# Patient Record
Sex: Female | Born: 1965
Health system: Southern US, Community
[De-identification: ages and names within clinical notes are randomized; demographics above are authoritative.]

## PROBLEM LIST (undated history)

## (undated) DIAGNOSIS — I4892 Unspecified atrial flutter: Secondary | ICD-10-CM

## (undated) DIAGNOSIS — I509 Heart failure, unspecified: Secondary | ICD-10-CM

## (undated) DIAGNOSIS — I495 Sick sinus syndrome: Secondary | ICD-10-CM

## (undated) DIAGNOSIS — E039 Hypothyroidism, unspecified: Secondary | ICD-10-CM

## (undated) DIAGNOSIS — E669 Obesity, unspecified: Secondary | ICD-10-CM

## (undated) DIAGNOSIS — Z9889 Other specified postprocedural states: Secondary | ICD-10-CM

## (undated) DIAGNOSIS — R011 Cardiac murmur, unspecified: Secondary | ICD-10-CM

## (undated) DIAGNOSIS — Z9289 Personal history of other medical treatment: Secondary | ICD-10-CM

## (undated) DIAGNOSIS — I4891 Unspecified atrial fibrillation: Secondary | ICD-10-CM

## (undated) DIAGNOSIS — E119 Type 2 diabetes mellitus without complications: Secondary | ICD-10-CM

## (undated) DIAGNOSIS — I499 Cardiac arrhythmia, unspecified: Secondary | ICD-10-CM

## (undated) DIAGNOSIS — Z950 Presence of cardiac pacemaker: Secondary | ICD-10-CM

## (undated) DIAGNOSIS — I10 Essential (primary) hypertension: Secondary | ICD-10-CM

## (undated) HISTORY — DX: Hypothyroidism, unspecified: E03.9

## (undated) HISTORY — DX: Type 2 diabetes mellitus without complications: E11.9

## (undated) HISTORY — DX: Obesity, unspecified: E66.9

## (undated) HISTORY — DX: Unspecified atrial flutter: I48.92

## (undated) HISTORY — DX: Unspecified atrial fibrillation: I48.91

## (undated) HISTORY — DX: Sick sinus syndrome: I49.5

## (undated) HISTORY — DX: Other specified postprocedural states: Z98.890

## (undated) HISTORY — DX: Personal history of other medical treatment: Z92.89

## (undated) HISTORY — DX: Essential (primary) hypertension: I10

---

## 2013-02-03 DIAGNOSIS — I4892 Unspecified atrial flutter: Secondary | ICD-10-CM

## 2013-02-03 DIAGNOSIS — I4891 Unspecified atrial fibrillation: Secondary | ICD-10-CM

## 2013-02-03 HISTORY — DX: Unspecified atrial fibrillation: I48.91

## 2013-02-03 HISTORY — DX: Unspecified atrial flutter: I48.92

## 2013-02-03 HISTORY — PX: ATRIAL FLUTTER ABLATION: SHX5733

## 2013-03-06 HISTORY — PX: CARDIAC CATHETERIZATION: SHX172

## 2013-03-06 HISTORY — PX: PACEMAKER INSERTION: SHX728

## 2014-06-29 ENCOUNTER — Ambulatory Visit (INDEPENDENT_AMBULATORY_CARE_PROVIDER_SITE_OTHER): Payer: Managed Care, Other (non HMO) | Admitting: Cardiovascular Disease

## 2014-06-29 ENCOUNTER — Encounter: Payer: Self-pay | Admitting: Cardiovascular Disease

## 2014-06-29 VITALS — BP 146/82 | HR 101 | Ht 62.0 in | Wt 256.0 lb

## 2014-06-29 DIAGNOSIS — Z8679 Personal history of other diseases of the circulatory system: Secondary | ICD-10-CM | POA: Diagnosis not present

## 2014-06-29 DIAGNOSIS — I10 Essential (primary) hypertension: Secondary | ICD-10-CM | POA: Insufficient documentation

## 2014-06-29 NOTE — Progress Notes (Signed)
06/29/2014 Priscilla [name]   16-May-1965  [id number]  Primary Physician Priscilla Alken, MD Primary Cardiologist: Priscilla Gess MD Priscilla [name]   HPI:  [name] is a very pleasant 50 year old severely overweight married Caucasian female mother of 3 children who relocated from [place] to the [place] area in December because of her husband's job with [employer]. Her primary care physician is Dr. Juluis [name]. She is referred here to be established because of prior history of atrial flutter, atrial flutter ablation, stent implantation. She has basically no chronic risk factors other than hypertension and family history with father who had a stent in his 58s and died of a myocardial infarction at age 33. She has never had a heart attack or stroke. She did have atrial flutter in February of last year patient underwent cardiac catheterization and was told she had "clean plumbing". She had atrial flutter ablation and subsequent required a Medtronic pacemaker which was followed in [place]. 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 She does complain of an occasional dry hacking cough and some chest heaviness however with recent cath that was clean I suspect this is unrelated to cardiac etiology.   Current Outpatient Prescriptions  Medication Sig Dispense Refill  . levothyroxine (SYNTHROID, LEVOTHROID) 25 MCG tablet Take 25 mcg by mouth daily. Take 1 tab daily  0  . losartan (COZAAR) 25 MG tablet Take 25 mg by mouth daily.    . metoprolol (LOPRESSOR) 50 MG tablet Take 50 mg by mouth 2 (two) times daily.     No current facility-administered medications for this visit.    No Known Allergies  History   Social History  . Marital Status: Married    Spouse Name: N/A  . Number of Children: N/A  . Years of Education: N/A   Occupational History  . Not on file.   Social History Main Topics  . Smoking status: Never Smoker   . Smokeless tobacco: Never Used  . Alcohol Use:  1.2 - 2.4 oz/week    0 Standard drinks or equivalent, 2-4 Glasses of wine per week  . Drug Use: No  . Sexual Activity: Not on file   Other Topics Concern  . Not on file   Social History Narrative  . No narrative on file     Review of Systems: General: negative for chills, fever, night sweats or weight changes.  Cardiovascular: negative for chest pain, dyspnea on exertion, edema, orthopnea, palpitations, paroxysmal nocturnal dyspnea or shortness of breath Dermatological: negative for rash Respiratory: negative for cough or wheezing Urologic: negative for hematuria Abdominal: negative for nausea, vomiting, diarrhea, bright red blood per rectum, melena, or hematemesis Neurologic: negative for visual changes, syncope, or dizziness All other systems reviewed and are otherwise negative except as noted above.    Blood pressure 146/82, pulse 101, height  (1.575 m), weight 256 lb (116.121 kg), last menstrual period 06/22/2014.  General appearance: alert and no distress Neck: no adenopathy, no carotid bruit, no JVD, supple, symmetrical, trachea midline and thyroid not enlarged, symmetric, no tenderness/mass/nodules Lungs: clear to auscultation bilaterally Heart: regular rate and rhythm, S1, S2 normal, no murmur, click, rub or gallop Extremities: extremities normal, atraumatic, no cyanosis or edema  EKG atrially paced rhythm at 101. I personally reviewed this EKG.  ASSESSMENT AND PLAN:   History of atrial flutter Patient had atrial flutter status post ablation and subsequent Medtronic pacemaker implantation February 2015 in [place]. She is not on an oral quadrant. I'm going  to refer her to Priscilla [name] for pacer check and evaluation.   Essential hypertension History of hypertension blood pressure measured today at 146/92. She is on losartan and metoprolol. Continue current meds at current dosing       Priscilla GessJonathan J. Gwen Edler MD [facility] HospitalFACP,FACC,FAHA, [facility] Hospital[facility]SCAI 06/29/2014 3:38 PM

## 2014-06-29 NOTE — Patient Instructions (Signed)
Follow up with Dr Royann Shiversroitoru in 1 month.

## 2014-06-29 NOTE — Assessment & Plan Note (Signed)
Patient had atrial flutter status post ablation and subsequent Medtronic pacemaker [place]. She is not on an oral quadrant. I'm going to refer her to Dr. Royann Shiversroitoru for pacer check and evaluation.

## 2014-06-29 NOTE — Assessment & Plan Note (Signed)
History of hypertension blood pressure measured today at 146/92. She is on losartan and metoprolol. Continue current meds at current dosing

## 2014-07-12 ENCOUNTER — Telehealth: Payer: Self-pay | Admitting: Cardiovascular Disease

## 2014-07-12 NOTE — Telephone Encounter (Signed)
Doctor is calling to get approval on a giving the pt anestheia for a procedure today

## 2014-07-12 NOTE — Telephone Encounter (Signed)
Dr. Colon BranchLeedee (sp?) dentist is calling to get OK to use lido w/epi for local anesthesia for dental cleaning and fillings.   Spoke with DOD, Dr. Allyson SabalBerry - who saw patient 5/26 - and he OK'ed this procedure

## 2014-07-19 ENCOUNTER — Telehealth: Payer: Self-pay | Admitting: Cardiovascular Disease

## 2014-07-19 NOTE — Telephone Encounter (Signed)
Received records from request made to [facility] for records by Dr Allyson Sabal on 06/29/14. Patient has appointment with Dr Royann Shivers on 08/01/14.  Records given to [facility] (medical records) for Dr Croitoru's schedule on 08/01/14. lp

## 2014-08-01 ENCOUNTER — Encounter: Payer: Self-pay | Admitting: Cardiovascular Disease

## 2014-08-01 ENCOUNTER — Ambulatory Visit (INDEPENDENT_AMBULATORY_CARE_PROVIDER_SITE_OTHER): Payer: Managed Care, Other (non HMO) | Admitting: Cardiovascular Disease

## 2014-08-01 VITALS — BP 124/72 | HR 86 | Ht 62.0 in | Wt 256.0 lb

## 2014-08-01 DIAGNOSIS — Z8679 Personal history of other diseases of the circulatory system: Secondary | ICD-10-CM

## 2014-08-01 DIAGNOSIS — I495 Sick sinus syndrome: Secondary | ICD-10-CM

## 2014-08-01 DIAGNOSIS — Z950 Presence of cardiac pacemaker: Secondary | ICD-10-CM

## 2014-08-01 DIAGNOSIS — R0600 Dyspnea, unspecified: Secondary | ICD-10-CM | POA: Diagnosis not present

## 2014-08-01 HISTORY — DX: Sick sinus syndrome: I49.5

## 2014-08-01 LAB — CUP PACEART INCLINIC DEVICE CHECK
Battery Remaining Longevity: 78 mo
Battery Voltage: 3 V
Brady Statistic AP VP Percent: 91.96 %
Brady Statistic AP VS Percent: 7.97 %
Brady Statistic AS VP Percent: 0 %
Brady Statistic AS VS Percent: 0.07 %
Brady Statistic RA Percent Paced: 99.93 %
Brady Statistic RV Percent Paced: 91.96 %
Date Time Interrogation Session: 20160628094228
Lead Channel Impedance Value: 361 Ohm
Lead Channel Impedance Value: 361 Ohm
Lead Channel Impedance Value: 456 Ohm
Lead Channel Impedance Value: 513 Ohm
Lead Channel Pacing Threshold Amplitude: 1 V
Lead Channel Pacing Threshold Amplitude: 1.25 V
Lead Channel Pacing Threshold Pulse Width: 0.6 ms
Lead Channel Pacing Threshold Pulse Width: 0.6 ms
Lead Channel Sensing Intrinsic Amplitude: 10.25 mV
Lead Channel Sensing Intrinsic Amplitude: 11.375 mV
Lead Channel Sensing Intrinsic Amplitude: 4.75 mV
Lead Channel Sensing Intrinsic Amplitude: 4.75 mV
Lead Channel Setting Pacing Amplitude: 2 V
Lead Channel Setting Pacing Amplitude: 2.5 V
Lead Channel Setting Pacing Pulse Width: 0.6 ms
Lead Channel Setting Sensing Sensitivity: 0.9 mV
Zone Setting Detection Interval: 350 ms
Zone Setting Detection Interval: 400 ms

## 2014-08-01 NOTE — Progress Notes (Signed)
Patient ID: Priscilla [name], female   DOB: 12-09-1965, 49 y.o.   MRN: [id number]     Cardiology Office Note   Date:  08/01/2014   ID:  Priscilla [name], DOB 12-09-1965, MRN [id number]  PCP:  Gaye AlkenBARNES,ELIZABETH STEWART, MD  Cardiologist:   Thurmon FairROITORU,Bard Haupert, MD   Chief Complaint  Patient presents with  . Follow-up    pacer check, chest discomfort for the last 3 month like when recoverin from pace maker and coughing with deep breath      History of Present Illness: Priscilla [name] is a 49 y.o. female who presents for establishment of new pacemaker follow-up. [facility] [place] [facility] [place] [facility] [place] and will be seeing Dr. Nanetta BattyJonathan Berry for general cardiology care. Last year she had problems with atrial flutter and underwent radiofrequency ablation, apparently requiring a pacemaker after that procedure. She recalls having chest discomfort with pleuritic qualities following those events. Over the last roughly 3 months she has noticed similar discomfort in her chest and the need to cough if she tries to take a deep breath.  Limited records are available for review. The discharge summary from 03/09/2013 listed diagnosis of "new onset atrial fibrillation with rapid ventricular rate". It also describes left ventricular ejection fraction of 25-30 percent area of the records describe atrial flutter with rapid ventricular response. Actual tracings of the rhythm are not available for review. A cardiac catheterization with coronary angiography was performed and showed normal coronary arteries. Left ventricular opacification on that angiogram was poor. There are no records available from the actual ablation procedure pacemaker implantation, performed at a different Medical Center.  Interrogation of her pacemaker shows normal device function. Estimated generator longevity is about 6 years. Lead parameters are favorable on both the atrial and ventricular lead. Activity level is roughly 2 hours a day and constant. There is 92%  ventricular pacing and 100% atrial pacing. I'm not sure why, but her lower rate was set at 85 bpm. Today when we extended the paced AV delay and sensed AV delay to 300 ms there was 1-1 atrioventricular conduction. Even when the atrial rate is taken down however there continues to be virtually 100% atrial pacing with infrequent sensed P waves. There have been no episodes of atrial tachycardia or atrial fibrillation.    Past Medical History  Diagnosis Date  . History of atrial flutter     status post a flutter ablation and subsequent Medtronic pacemaker, battery 2015  . Hypertension   . Hypothyroidism   . Pacemaker 08/01/2014    Dual chamber Medtronic advisa MRI conditional implanted February 2015    Past Surgical History  Procedure Laterality Date  . Cesarean section      x3  . Atrial ablation surgery      for a flutter, required pace maker post procedure  . Pacemaker insertion  03/2013  . Cardiac catheterization  03/2013     Current Outpatient Prescriptions  Medication Sig Dispense Refill  . levothyroxine (SYNTHROID, LEVOTHROID) 25 MCG tablet Take 25 mcg by mouth daily. Take 1 tab daily  0  . losartan (COZAAR) 25 MG tablet Take 25 mg by mouth daily.    . Melatonin 5 MG TABS Take 1 tablet by mouth as needed.    . metoprolol (LOPRESSOR) 50 MG tablet Take 50 mg by mouth 2 (two) times daily.     No current facility-administered medications for this visit.    Allergies:   Review of patient's allergies indicates no known allergies.    Social History:  The patient  reports that she has never smoked. She has never used smokeless tobacco. She reports that she drinks about 1.2 - 2.4 oz of alcohol per week. She reports that she does not use illicit drugs.   Family History:  The patient's family history includes Cancer in her maternal grandmother; Heart attack (age of onset: 82) in her paternal grandfather; Heart attack (age of onset: 66) in her father; Rheumatic fever in her mother; Stroke  in her mother.    ROS:  Please see the history of present illness.    Otherwise, review of systems positive for difficulty sleeping on her back, prefers to sleep on her side..   All other systems are reviewed and negative.    PHYSICAL EXAM: VS:  BP 124/72 mmHg  Pulse 86 , BMI Body mass index is 46.81 kg/(m^2). height 5 foot 2 inches, weight 256 pounds  General: Alert, oriented x3, no distress Head: no evidence of trauma, PERRL, EOMI, no exophtalmos or lid lag, no myxedema, no xanthelasma; normal ears, nose and oropharynx Neck: normal jugular venous pulsations and no hepatojugular reflux; brisk carotid pulses without delay and no carotid bruits Chest: clear to auscultation, no signs of consolidation by percussion or palpation, normal fremitus, symmetrical and full respiratory excursions Cardiovascular: normal position and quality of the apical impulse, regular rhythm, normal first and second heart sounds, no murmurs, rubs or gallops Abdomen: no tenderness or distention, no masses by palpation, no abnormal pulsatility or arterial bruits, normal bowel sounds, no hepatosplenomegaly Extremities: no clubbing, cyanosis or edema; 2+ radial, ulnar and brachial pulses bilaterally; 2+ right femoral, posterior tibial and dorsalis pedis pulses; 2+ left femoral, posterior tibial and dorsalis pedis pulses; no subclavian or femoral bruits Neurological: grossly nonfocal Psych: euthymic mood, full affect   EKG:  EKG is not ordered today. The ekg ordered May 27 demonstrates atrial paced at 100 bpm, ventricular sensed with broad QRS (nonspecific IVCD with left axis deviation, most closely resembling left anterior fascicular block and incomplete right bundle branch block)   Wt Readings from Last 3 Encounters:  06/29/14 256 lb (116.121 kg)      ASSESSMENT AND PLAN:  1. Dual-chamber permanent pacemaker with normal function but with unusual settings. I'm not sure why she would require a lower rate limit of  85 bpm. I also think we should try to avoid ventricular pacing, especially with a history of depressed left ventricular systolic function. Device reprogrammed MVP-R the lower rate limit of 60 bpm. With the settings there was 100% atrial paced ventricular sensed rhythm while in the office. Reducing the dose of beta blocker may lead to less atrial pacing as well, but will leave this decision for a future date after discussing her care with Dr. Nanetta Batty.  2. Reported history of atrial flutter radiofrequency ablation complicated by need for pacemaker. This is also an unusual occurrence with atrial flutter cavotricuspid isthmus ablation and one wonders whether a different ablation procedure might have been performed. Would recommend retrieving the records from the Center with this procedure was performed [facility]  3. Pleuritic chest discomfort. This suggests that she may have had post ablation or post pacemaker acute pericarditis. Why she would have recurrent symptoms of this at this time is uncertain. Recommend repeat echocardiography.  4. Consider obstructive sleep apnea or other cause for cor pulmonale. She is morbidly obese and therefore at risk for developing pulmonary hypertension with secondary right heart overload and right atrial arrhythmia.  Will check an echocardiogram. If there  is clear evidence of right heart enlargement, with pursue evaluation for pulmonary hypertension and/or obstructive sleep apnea. Echo will also help clarify what her current left ventricular systolic function is (per report from [facility] EF 25-30 percent before the ablation, question tachycardia-induced cardiomyopathy).  From a strictly pacemaker point of view we'll plan remote downloads every 3 months and yearly office visits. She does not appear to have either complete heart block or pacemaker dependency.   Current medicines are reviewed at length with the patient today.  The patient does not  have concerns regarding medicines.  The following changes have been made:  no change  Labs/ tests ordered today include:   Orders Placed This Encounter  Procedures  . Implantable device check  . ECHOCARDIOGRAM COMPLETE   Patient Instructions  Your physician has requested that you have an echocardiogram. Echocardiography is a painless test that uses sound waves to create images of your heart. It provides your doctor with information about the size and shape of your heart and how well your heart's chambers and valves are working. This procedure takes approximately one hour. There are no restrictions for this procedure.    Remote monitoring is used to monitor your pacemaker from home. This monitoring reduces the number of office visits required to check your device to one time per year. It allows [place] to keep an eye on the functioning of your device to ensure it is working properly. You are scheduled for a device check from home on 10/31/2014. You may send your transmission at any time that day. If you have a wireless device, the transmission will be sent automatically. After your physician reviews your transmission, you will receive a postcard with your next transmission date.  Your physician recommends that you schedule a follow-up appointment in: 12 months with Dr.Hrishikesh Hoeg      Joie Bimler, MD  08/01/2014 4:46 PM    Thurmon Fair, MD, [facility] [phone] office [phone] pager

## 2014-08-01 NOTE — Patient Instructions (Addendum)
Your physician has requested that you have an echocardiogram. Echocardiography is a painless test that uses sound waves to create images of your heart. It provides your doctor with information about the size and shape of your heart and how well your heart's chambers and valves are working. This procedure takes approximately one hour. There are no restrictions for this procedure.    Remote monitoring is used to monitor your pacemaker from home. This monitoring reduces the number of office visits required to check your device to one time per year. It allows us to keep an eye on the functioning of your device to ensure it is working properly. You are scheduled for a device check from home on 10/31/2014. You may send your transmission at any time that day. If you have a wireless device, the transmission will be sent automatically. After your physician reviews your transmission, you will receive a postcard with your next transmission date.  Your physician recommends that you schedule a follow-up appointment in: 12 months with Dr.Croitoru

## 2014-08-02 ENCOUNTER — Ambulatory Visit (HOSPITAL_COMMUNITY)
Admission: RE | Admit: 2014-08-02 | Discharge: 2014-08-02 | Disposition: A | Discharge status: Home / Self Care | Payer: Managed Care, Other (non HMO) | Source: Ambulatory Visit | Attending: Cardiovascular Disease | Admitting: Cardiovascular Disease

## 2014-08-02 DIAGNOSIS — Z8249 Family history of ischemic heart disease and other diseases of the circulatory system: Secondary | ICD-10-CM | POA: Insufficient documentation

## 2014-08-02 DIAGNOSIS — I10 Essential (primary) hypertension: Secondary | ICD-10-CM | POA: Insufficient documentation

## 2014-08-02 DIAGNOSIS — R0600 Dyspnea, unspecified: Secondary | ICD-10-CM | POA: Diagnosis not present

## 2014-08-04 ENCOUNTER — Telehealth: Payer: Self-pay | Admitting: *Deleted

## 2014-08-04 NOTE — Telephone Encounter (Signed)
LMTCB//sss 

## 2014-08-10 ENCOUNTER — Telehealth: Payer: Self-pay | Admitting: Cardiovascular Disease

## 2014-08-10 NOTE — Telephone Encounter (Signed)
Patient stated that the initial day after the ppm changes patient states that she didn't feel well, but after that day she felt fine. Patient states that she felt some CP and LE edema after drinking 2 large beers while on vacation (7/3)--CP resolved by next day (7/4), edema resolved (7/5), pt denied unusual ShOB -chronic DOE. Patient states that overall she feels better than she has in a while. [facility].

## 2014-08-10 NOTE — Telephone Encounter (Signed)
Follow Up ° °Pt returned call//  °

## 2014-08-10 NOTE — Telephone Encounter (Signed)
Thanks

## 2014-08-10 NOTE — Telephone Encounter (Signed)
Returning your call,from last week.

## 2014-08-11 NOTE — Telephone Encounter (Signed)
Returned patient's call. See Echocardiogram results - Dr Royann Shiversroitoru wanted to know how she felt after changes in pacemaker settings.  Patient was on vacation last week. She states she hasn't really felt any different since adjusting resting heart rate/pacemaker settings. Still reports some chest discomfort. Patient stated she'd give the changes a couple more weeks now that she is back home from vacation and back to her normal routine.

## 2014-08-14 ENCOUNTER — Encounter: Payer: Self-pay | Admitting: Cardiovascular Disease

## 2014-08-17 ENCOUNTER — Telehealth: Payer: Self-pay | Admitting: Cardiovascular Disease

## 2014-08-17 NOTE — Telephone Encounter (Signed)
New Message      Pt calling stating that she received her Medtronic CRT device, downloaded the app and uploaded the reading. Please call back and advise.

## 2014-08-18 NOTE — Telephone Encounter (Signed)
Informed patient that "test" transmission was received. Patient voiced understanding.

## 2014-10-31 ENCOUNTER — Telehealth: Payer: Self-pay | Admitting: Cardiology

## 2014-10-31 ENCOUNTER — Ambulatory Visit (INDEPENDENT_AMBULATORY_CARE_PROVIDER_SITE_OTHER): Payer: Managed Care, Other (non HMO) | Admitting: *Deleted

## 2014-10-31 DIAGNOSIS — Z950 Presence of cardiac pacemaker: Secondary | ICD-10-CM | POA: Diagnosis not present

## 2014-10-31 DIAGNOSIS — Z8679 Personal history of other diseases of the circulatory system: Secondary | ICD-10-CM

## 2014-10-31 NOTE — Progress Notes (Signed)
Remote pacemaker transmission.   

## 2014-10-31 NOTE — Telephone Encounter (Signed)
Spoke with pt and reminded pt of remote transmission that is due today. Pt verbalized understanding.   

## 2014-11-06 LAB — CUP PACEART REMOTE DEVICE CHECK
Battery Remaining Longevity: 95 mo
Battery Voltage: 3.02 V
Brady Statistic AP VP Percent: 0.62 %
Brady Statistic AP VS Percent: 98.93 %
Brady Statistic AS VP Percent: 0 %
Brady Statistic AS VS Percent: 0.45 %
Brady Statistic RA Percent Paced: 99.55 %
Brady Statistic RV Percent Paced: 0.62 %
Date Time Interrogation Session: 20160927163056
Lead Channel Impedance Value: 361 Ohm
Lead Channel Impedance Value: 361 Ohm
Lead Channel Impedance Value: 399 Ohm
Lead Channel Impedance Value: 456 Ohm
Lead Channel Pacing Threshold Amplitude: 1 V
Lead Channel Pacing Threshold Amplitude: 2 V
Lead Channel Pacing Threshold Pulse Width: 0.4 ms
Lead Channel Pacing Threshold Pulse Width: 0.4 ms
Lead Channel Sensing Intrinsic Amplitude: 1.125 mV
Lead Channel Sensing Intrinsic Amplitude: 1.125 mV
Lead Channel Sensing Intrinsic Amplitude: 9.5 mV
Lead Channel Sensing Intrinsic Amplitude: 9.5 mV
Lead Channel Setting Pacing Amplitude: 2 V
Lead Channel Setting Pacing Amplitude: 4 V
Lead Channel Setting Pacing Pulse Width: 0.4 ms
Lead Channel Setting Sensing Sensitivity: 0.9 mV
Zone Setting Detection Interval: 350 ms
Zone Setting Detection Interval: 400 ms

## 2014-11-09 ENCOUNTER — Encounter: Payer: Self-pay | Admitting: Cardiology

## 2014-11-13 DIAGNOSIS — Z9289 Personal history of other medical treatment: Secondary | ICD-10-CM

## 2014-11-13 HISTORY — DX: Personal history of other medical treatment: Z92.89

## 2014-11-16 ENCOUNTER — Encounter: Payer: Self-pay | Admitting: Cardiology

## 2014-11-23 ENCOUNTER — Encounter: Payer: Self-pay | Admitting: Cardiovascular Disease

## 2014-12-13 ENCOUNTER — Other Ambulatory Visit: Payer: Self-pay | Admitting: Obstetrics and Gynecology

## 2014-12-25 ENCOUNTER — Encounter: Payer: Self-pay | Admitting: Cardiovascular Disease

## 2014-12-25 ENCOUNTER — Encounter: Payer: Self-pay | Admitting: *Deleted

## 2014-12-26 ENCOUNTER — Encounter: Payer: Self-pay | Admitting: Cardiovascular Disease

## 2014-12-26 ENCOUNTER — Encounter (HOSPITAL_COMMUNITY): Payer: Self-pay

## 2014-12-26 ENCOUNTER — Encounter (HOSPITAL_COMMUNITY)
Admission: RE | Admit: 2014-12-26 | Discharge: 2014-12-26 | Disposition: A | Discharge status: Home / Self Care | Payer: Managed Care, Other (non HMO) | Source: Ambulatory Visit | Attending: Obstetrics and Gynecology | Admitting: Obstetrics and Gynecology

## 2014-12-26 DIAGNOSIS — R32 Unspecified urinary incontinence: Secondary | ICD-10-CM | POA: Diagnosis not present

## 2014-12-26 DIAGNOSIS — Z01818 Encounter for other preprocedural examination: Secondary | ICD-10-CM | POA: Insufficient documentation

## 2014-12-26 LAB — CBC
HCT: 37.9 % (ref 36.0–46.0)
Hemoglobin: 12.5 g/dL (ref 12.0–15.0)
MCH: 29.3 pg (ref 26.0–34.0)
MCHC: 33 g/dL (ref 30.0–36.0)
MCV: 88.8 fL (ref 78.0–100.0)
Platelets: 280 10*3/uL (ref 150–400)
RBC: 4.27 MIL/uL (ref 3.87–5.11)
RDW: 13.6 % (ref 11.5–15.5)
WBC: 9.8 10*3/uL (ref 4.0–10.5)

## 2014-12-26 LAB — BASIC METABOLIC PANEL
Anion gap: 7 (ref 5–15)
BUN: 15 mg/dL (ref 6–20)
CO2: 27 mmol/L (ref 22–32)
Calcium: 9.1 mg/dL (ref 8.9–10.3)
Chloride: 104 mmol/L (ref 101–111)
Creatinine, Ser: 0.98 mg/dL (ref 0.44–1.00)
GFR calc Af Amer: >60 mL/min (ref >60)
GFR calc non Af Amer: >60 mL/min (ref >60)
Glucose, Bld: 109 mg/dL — ABNORMAL HIGH (ref 65–99)
Potassium: 4 mmol/L (ref 3.5–5.1)
Sodium: 138 mmol/L (ref 135–145)

## 2014-12-26 NOTE — Patient Instructions (Addendum)
   Your procedure is scheduled on: DEC 2 (FRIDAY)  [facility] through [facility] at: 8AM  Pick up the phone at the desk and dial [phone] and inform us of your arrival.  Please call this number if you have any problems the morning of surgery: [phone]  DO NOT EAT OR DRINK LIQUIDS AFTER DEC 1   Take these medicines the morning of surgery with a SIP OF WATER: TAKE MEDICATIONS AS YOU NORMALLY TAKE THEM  Do not wear jewelry, make-up, or FINGER nail polish No metal in your hair or on your body. Do not wear lotions, powders, perfumes.  You may wear deodorant.  Do not bring valuables to the hospital. Contacts, dentures or bridgework may not be worn into surgery.  Leave suitcase in the car. After Surgery it may be brought to your room. For patients being admitted to the hospital, checkout time is 11:00am the day of discharge.    Patients discharged on the day of surgery will not be allowed to drive home.

## 2014-12-27 ENCOUNTER — Other Ambulatory Visit (HOSPITAL_COMMUNITY): Payer: Self-pay | Admitting: Obstetrics and Gynecology

## 2014-12-27 ENCOUNTER — Telehealth: Payer: Self-pay | Admitting: Cardiovascular Disease

## 2014-12-27 NOTE — Telephone Encounter (Signed)
Priscilla [name] WANTED TO KNOW IF FORM WAS RECEIVED TO BE SIGN ( PACEMAKER) DURING SURGERY  INFORMED Priscilla [name] , FORM IS HERE WILL BE ADDRESS on Monday when DR CROITORU PER BARBARA VOICE UNDERSTANDING

## 2014-12-27 NOTE — H&P (Signed)
Priscilla [name] is a 49 y.o. female  P: 3-0-2-3 presents for placement of tension free vaginal tape because of incontinence that is predominantly stress urinary incontinence. For the past year the patient has had leaking with physical activity, coughing sneezing and laughing with symptoms becoming progressively worse.  She denies dysuria, changes in bowel function, or dyspareunia.  She occasionally will feel as though her bladder is not empty and from time to time will have urinary urgency.  Menstrual periods are monthly with a 7 day flow in which she changes her pad every 2 hours.  Though she has cramping for 2 of her 7 day flow (that she rates as 7/10 on a 10 point scale)  she finds relief with Tylenol.  A review of medical and surgical management options were given to the patient regarding management of her symptoms however,  she wishes to proceed with placement of tension free vaginal tape.   Past Medical History  OB History:G: 5;   P: 3-0-2-3;  C-sections 1989,  1992 and 2001  GYN History: menarche: 49YO    LMP: 12/11/14    Contracepton bilateral tubal ligation  The patient denies history of sexually transmitted disease.  Denies history of abnormal PAP smear.   Last PAP smear: October 2016-normal  Medical History: Atrial Fibrillation managed with Pacemaker;  Hypertension and Thyroid Disease  Surgical History: 1991 Dilatation and Curettage;  2001 Tubal Sterilization;  2003  Laparotomy for Removal of Abdominal Mass; 2010 Endometrial Ablation;  2015 Cardiac Ablation  Denies problems with anesthesia or history of blood transfusions  Family History: Hypertension,  Heart Disease, Rheumatic Fever, Stroke and Breast Cancer  Social History: Married and a Futures traderHomemaker;  Denies tobacco use but occasionally uses alcohol   Medication:  Losartan 25 mg daily Metoprolol Tartrate 50 mg daily Synthroid 25 mcg daily  No Known Allergies   Denies sensitivity to peanuts, shellfish, soy, latex or  adhesives.   ROS: Admits to glasses/contact lenses;  Denies headache, vision changes, nasal congestion, dysphagia, tinnitus, dizziness, hoarseness, cough,  chest pain, shortness of breath, nausea, vomiting, diarrhea,constipation,  urinary frequency, dysuria, hematuria, vaginitis symptoms, pelvic pain, swelling of joints,easy bruising,  myalgias, arthralgias, skin rashes, unexplained weight loss and except as is mentioned in the history of present illness, patient's review of systems is otherwise negative.   Physical Exam  Bp: 116/74  P: 80  R:24  Temperature: 97.7 degrees F orally  Weight: 236 lbs.  Height: 5'2"  BMI: 43.2  Neck: supple without masses or thyromegaly Lungs: clear to auscultation Heart: regular rate and rhythm Abdomen: soft, non-tender and no organomegaly Pelvic:EGBUS- wnl; vagina-normal rugae; uterus-normal size, (exam limited by habitus)  cervix without lesions or motion tenderness; adnexae-no tenderness or masses Extremities:  no clubbing, cyanosis or edema   Assesment: Stress Urinary Incontinence   Disposition: Reviewed the risks of surgery to include, but not limited to: reaction to anesthesia, damage to adjacent organs, infection, worsening of incontinence symptoms, excessive bleeding and erosion of vaginal tape. The patient verbalized understanding of these risks and has consented to proceed with Placement of Tension Free Vaginal Tape on 01/05/2015 with Dr. Su Hiltoberts.  CSN# [id number]   Chijioke Lasser J. Lowell GuitarPowell, PA-C  for Dr. Woodroe ModeAngela Y. Su Hiltoberts

## 2015-01-01 ENCOUNTER — Telehealth: Payer: Self-pay | Admitting: *Deleted

## 2015-01-01 NOTE — Telephone Encounter (Signed)
Perioperative Rx for implanted Cardiac Device Programming:  Procedure should not interfere with device function.  No device reprogramming or magnet placement needed.  Normal device function.  [facility].

## 2015-01-04 NOTE — Anesthesia Preprocedure Evaluation (Addendum)
Anesthesia Evaluation  Patient identified by MRN, date of birth, ID band Patient awake    Reviewed: Allergy & Precautions, NPO status , Patient's Chart, lab work & pertinent test results, reviewed documented beta blocker date and time   Airway Mallampati: II  TM Distance: >3 FB Neck ROM: Full    Dental  (+) Teeth Intact   Pulmonary neg pulmonary ROS,    breath sounds clear to auscultation       Cardiovascular hypertension, Pt. on medications and Pt. on home beta blockers + dysrhythmias Atrial Fibrillation + pacemaker  Rhythm:Regular Rate:Normal     Neuro/Psych negative neurological ROS  negative psych ROS   GI/Hepatic negative GI ROS, Neg liver ROS,   Endo/Other  Hypothyroidism   Renal/GU negative Renal ROS  negative genitourinary   Musculoskeletal negative musculoskeletal ROS (+)   Abdominal   Peds negative pediatric ROS (+)  Hematology negative hematology ROS (+)   Anesthesia Other Findings   Reproductive/Obstetrics negative OB ROS                            Lab Results  Component Value Date   WBC 9.8 12/26/2014   HGB 12.5 12/26/2014   HCT 37.9 12/26/2014   MCV 88.8 12/26/2014   PLT 280 12/26/2014   Lab Results  Component Value Date   CREATININE 0.98 12/26/2014   BUN 15 12/26/2014   NA 138 12/26/2014   K 4.0 12/26/2014   CL 104 12/26/2014   CO2 27 12/26/2014   No results found for: INR, PROTIME   Anesthesia Physical Anesthesia Plan  ASA: III  Anesthesia Plan: General   Post-op Pain Management:    Induction: Intravenous  Airway Management Planned: LMA  Additional Equipment:   Intra-op Plan:   Post-operative Plan: Extubation in OR  Informed Consent: I have reviewed the patients History and Physical, chart, labs and discussed the procedure including the risks, benefits and alternatives for the proposed anesthesia with the patient or authorized representative who  has indicated his/her understanding and acceptance.   Dental advisory given  Plan Discussed with: CRNA  Anesthesia Plan Comments:         Anesthesia Quick Evaluation

## 2015-01-05 ENCOUNTER — Ambulatory Visit (HOSPITAL_COMMUNITY): Payer: Managed Care, Other (non HMO) | Admitting: Anesthesiology

## 2015-01-05 ENCOUNTER — Encounter (HOSPITAL_COMMUNITY): Payer: Self-pay | Admitting: Anesthesiology

## 2015-01-05 ENCOUNTER — Observation Stay (HOSPITAL_COMMUNITY)
Admission: RE | Admit: 2015-01-05 | Discharge: 2015-01-06 | Disposition: A | Discharge status: Home / Self Care | Payer: Managed Care, Other (non HMO) | Source: Ambulatory Visit | Attending: Obstetrics and Gynecology | Admitting: Obstetrics and Gynecology

## 2015-01-05 ENCOUNTER — Encounter (HOSPITAL_COMMUNITY)
Admission: RE | Disposition: A | Discharge status: Home / Self Care | Payer: Self-pay | Source: Ambulatory Visit | Attending: Obstetrics and Gynecology

## 2015-01-05 DIAGNOSIS — N393 Stress incontinence (female) (male): Secondary | ICD-10-CM | POA: Diagnosis not present

## 2015-01-05 DIAGNOSIS — E079 Disorder of thyroid, unspecified: Secondary | ICD-10-CM | POA: Diagnosis not present

## 2015-01-05 DIAGNOSIS — I4891 Unspecified atrial fibrillation: Secondary | ICD-10-CM | POA: Insufficient documentation

## 2015-01-05 DIAGNOSIS — I10 Essential (primary) hypertension: Secondary | ICD-10-CM | POA: Diagnosis not present

## 2015-01-05 DIAGNOSIS — Z950 Presence of cardiac pacemaker: Secondary | ICD-10-CM | POA: Diagnosis not present

## 2015-01-05 HISTORY — PX: CYSTOSCOPY: SHX5120

## 2015-01-05 HISTORY — PX: BLADDER SUSPENSION: SHX72

## 2015-01-05 LAB — PREGNANCY, URINE: Preg Test, Ur: NEGATIVE

## 2015-01-05 SURGERY — TRANSVAGINAL TAPE (TVT) PROCEDURE
Anesthesia: General | Site: Bladder

## 2015-01-05 MED ORDER — PROPOFOL 10 MG/ML IV BOLUS
INTRAVENOUS | Status: DC | PRN
  Administered 2015-01-05: 180 mg via INTRAVENOUS

## 2015-01-05 MED ORDER — SODIUM CHLORIDE 0.9 % IJ SOLN: 9.0000 mL | INTRAMUSCULAR | Status: DC | PRN

## 2015-01-05 MED ORDER — KETOROLAC TROMETHAMINE 30 MG/ML IJ SOLN
30.0000 mg | Freq: Four times a day (QID) | INTRAMUSCULAR | Status: AC
  Administered 2015-01-05 – 2015-01-06 (×3): 30 mg via INTRAVENOUS
  Filled 2015-01-05 (×3): qty 1

## 2015-01-05 MED ORDER — KETOROLAC TROMETHAMINE 30 MG/ML IJ SOLN
INTRAMUSCULAR | Status: DC | PRN
  Administered 2015-01-05: 30 mg via INTRAVENOUS

## 2015-01-05 MED ORDER — MIDAZOLAM HCL 2 MG/2ML IJ SOLN
INTRAMUSCULAR | Status: DC | PRN
  Administered 2015-01-05: 1 mg via INTRAVENOUS

## 2015-01-05 MED ORDER — STERILE WATER FOR IRRIGATION IR SOLN
Status: DC | PRN
  Administered 2015-01-05: 1000 mL

## 2015-01-05 MED ORDER — METOPROLOL TARTRATE 50 MG PO TABS
50.0000 mg | ORAL_TABLET | Freq: Two times a day (BID) | ORAL | Status: DC
  Administered 2015-01-06: 50 mg via ORAL
  Filled 2015-01-05: qty 1

## 2015-01-05 MED ORDER — LEVOTHYROXINE SODIUM 25 MCG PO TABS
25.0000 ug | ORAL_TABLET | Freq: Every day | ORAL | Status: DC
  Administered 2015-01-06: 25 ug via ORAL
  Filled 2015-01-05: qty 1

## 2015-01-05 MED ORDER — ESTRADIOL 0.1 MG/GM VA CREA
TOPICAL_CREAM | VAGINAL | Status: AC
Start: 2015-01-05 — End: 2015-01-05
  Filled 2015-01-05: qty 42.5

## 2015-01-05 MED ORDER — PROPOFOL 10 MG/ML IV BOLUS
INTRAVENOUS | Status: AC
  Filled 2015-01-05: qty 20

## 2015-01-05 MED ORDER — LOSARTAN POTASSIUM 25 MG PO TABS
25.0000 mg | ORAL_TABLET | Freq: Every day | ORAL | Status: DC
  Administered 2015-01-06: 25 mg via ORAL
  Filled 2015-01-05: qty 1

## 2015-01-05 MED ORDER — SODIUM CHLORIDE 0.9 % IJ SOLN
INTRAMUSCULAR | Status: AC
  Filled 2015-01-05: qty 100

## 2015-01-05 MED ORDER — OXYCODONE-ACETAMINOPHEN 5-325 MG PO TABS: 1.0000 | ORAL_TABLET | ORAL | Status: DC | PRN

## 2015-01-05 MED ORDER — VASOPRESSIN 20 UNIT/ML IV SOLN
INTRAVENOUS | Status: AC
  Filled 2015-01-05: qty 1

## 2015-01-05 MED ORDER — FENTANYL CITRATE (PF) 100 MCG/2ML IJ SOLN: 25.0000 ug | INTRAMUSCULAR | Status: DC | PRN

## 2015-01-05 MED ORDER — FENTANYL CITRATE (PF) 100 MCG/2ML IJ SOLN
INTRAMUSCULAR | Status: DC | PRN
  Administered 2015-01-05: 25 ug via INTRAVENOUS
  Administered 2015-01-05 (×2): 50 ug via INTRAVENOUS

## 2015-01-05 MED ORDER — LACTATED RINGERS IV SOLN
INTRAVENOUS | Status: DC
  Administered 2015-01-05 – 2015-01-06 (×3): via INTRAVENOUS

## 2015-01-05 MED ORDER — ONDANSETRON HCL 4 MG PO TABS: 4.0000 mg | ORAL_TABLET | Freq: Three times a day (TID) | ORAL | Status: DC | PRN

## 2015-01-05 MED ORDER — KETOROLAC TROMETHAMINE 30 MG/ML IJ SOLN
INTRAMUSCULAR | Status: AC
  Filled 2015-01-05: qty 1

## 2015-01-05 MED ORDER — MEPERIDINE HCL 25 MG/ML IJ SOLN: 6.2500 mg | INTRAMUSCULAR | Status: DC | PRN

## 2015-01-05 MED ORDER — LIDOCAINE HCL (CARDIAC) 20 MG/ML IV SOLN
INTRAVENOUS | Status: DC | PRN
  Administered 2015-01-05: 30 mg via INTRAVENOUS
  Administered 2015-01-05: 70 mg via INTRAVENOUS

## 2015-01-05 MED ORDER — ONDANSETRON HCL 4 MG/2ML IJ SOLN
INTRAMUSCULAR | Status: AC
  Filled 2015-01-05: qty 2

## 2015-01-05 MED ORDER — LACTATED RINGERS IV SOLN: INTRAVENOUS | Status: DC

## 2015-01-05 MED ORDER — ONDANSETRON HCL 4 MG/2ML IJ SOLN: 4.0000 mg | Freq: Four times a day (QID) | INTRAMUSCULAR | Status: DC | PRN

## 2015-01-05 MED ORDER — DIPHENHYDRAMINE HCL 50 MG/ML IJ SOLN: 12.5000 mg | Freq: Four times a day (QID) | INTRAMUSCULAR | Status: DC | PRN

## 2015-01-05 MED ORDER — DOCUSATE SODIUM 100 MG PO CAPS: 100.0000 mg | ORAL_CAPSULE | Freq: Two times a day (BID) | ORAL | Status: DC | PRN

## 2015-01-05 MED ORDER — DIPHENHYDRAMINE HCL 12.5 MG/5ML PO ELIX: 12.5000 mg | ORAL_SOLUTION | Freq: Four times a day (QID) | ORAL | Status: DC | PRN

## 2015-01-05 MED ORDER — CEFAZOLIN SODIUM-DEXTROSE 2-3 GM-% IV SOLR
2.0000 g | INTRAVENOUS | Status: AC
  Administered 2015-01-05: 2 mg via INTRAVENOUS

## 2015-01-05 MED ORDER — ONDANSETRON HCL 4 MG/2ML IJ SOLN
INTRAMUSCULAR | Status: DC | PRN
  Administered 2015-01-05: 4 mg via INTRAVENOUS

## 2015-01-05 MED ORDER — ESTRADIOL 0.1 MG/GM VA CREA
TOPICAL_CREAM | VAGINAL | Status: DC | PRN
  Administered 2015-01-05: 1 via VAGINAL

## 2015-01-05 MED ORDER — DEXAMETHASONE SODIUM PHOSPHATE 4 MG/ML IJ SOLN
INTRAMUSCULAR | Status: AC
  Filled 2015-01-05: qty 1

## 2015-01-05 MED ORDER — MENTHOL 3 MG MT LOZG
1.0000 | LOZENGE | OROMUCOSAL | Status: DC | PRN
Start: 2015-01-05 — End: 2015-01-06

## 2015-01-05 MED ORDER — IBUPROFEN 600 MG PO TABS
600.0000 mg | ORAL_TABLET | Freq: Four times a day (QID) | ORAL | Status: DC | PRN
  Administered 2015-01-06: 600 mg via ORAL
  Filled 2015-01-05: qty 1

## 2015-01-05 MED ORDER — DEXAMETHASONE SODIUM PHOSPHATE 10 MG/ML IJ SOLN
INTRAMUSCULAR | Status: DC | PRN
  Administered 2015-01-05: 4 mg via INTRAVENOUS

## 2015-01-05 MED ORDER — LACTATED RINGERS IV SOLN
INTRAVENOUS | Status: DC
  Administered 2015-01-05 (×2): via INTRAVENOUS

## 2015-01-05 MED ORDER — CEFAZOLIN SODIUM-DEXTROSE 2-3 GM-% IV SOLR
INTRAVENOUS | Status: AC
  Filled 2015-01-05: qty 50

## 2015-01-05 MED ORDER — HYDROMORPHONE HCL 1 MG/ML IJ SOLN: 1.0000 mg | INTRAMUSCULAR | Status: DC | PRN

## 2015-01-05 MED ORDER — SCOPOLAMINE 1 MG/3DAYS TD PT72
MEDICATED_PATCH | TRANSDERMAL | Status: AC
  Administered 2015-01-05: 1.5 mg via TRANSDERMAL
  Filled 2015-01-05: qty 1

## 2015-01-05 MED ORDER — SCOPOLAMINE 1 MG/3DAYS TD PT72
1.0000 | MEDICATED_PATCH | Freq: Once | TRANSDERMAL | Status: DC
  Administered 2015-01-05: 1.5 mg via TRANSDERMAL

## 2015-01-05 MED ORDER — LIDOCAINE HCL (CARDIAC) 20 MG/ML IV SOLN
INTRAVENOUS | Status: AC
  Filled 2015-01-05: qty 5

## 2015-01-05 MED ORDER — NALOXONE HCL 0.4 MG/ML IJ SOLN: 0.4000 mg | INTRAMUSCULAR | Status: DC | PRN

## 2015-01-05 MED ORDER — ROCURONIUM BROMIDE 100 MG/10ML IV SOLN
INTRAVENOUS | Status: AC
  Filled 2015-01-05: qty 1

## 2015-01-05 MED ORDER — FENTANYL CITRATE (PF) 250 MCG/5ML IJ SOLN
INTRAMUSCULAR | Status: AC
  Filled 2015-01-05: qty 5

## 2015-01-05 MED ORDER — MIDAZOLAM HCL 2 MG/2ML IJ SOLN
INTRAMUSCULAR | Status: AC
  Filled 2015-01-05: qty 2

## 2015-01-05 MED ORDER — PROMETHAZINE HCL 25 MG/ML IJ SOLN: 6.2500 mg | INTRAMUSCULAR | Status: DC | PRN

## 2015-01-05 MED ORDER — VASOPRESSIN 20 UNIT/ML IV SOLN
INTRAVENOUS | Status: DC | PRN
  Administered 2015-01-05: 14 mL via INTRAMUSCULAR

## 2015-01-05 SURGICAL SUPPLY — 35 items
BLADE SURG 11 STRL SS (BLADE) ×2 IMPLANT
BLADE SURG 15 STRL LF C SS BP (BLADE) ×1 IMPLANT
BLADE SURG 15 STRL SS (BLADE) ×1
CANISTER SUCT 3000ML (MISCELLANEOUS) ×2 IMPLANT
CATH FOLEY 2WAY SLVR  5CC 18FR (CATHETERS) ×1
CATH FOLEY 2WAY SLVR 5CC 18FR (CATHETERS) ×1 IMPLANT
CLOTH BEACON ORANGE TIMEOUT ST (SAFETY) ×2 IMPLANT
COVER MAYO STAND STRL (DRAPES) ×2 IMPLANT
DECANTER SPIKE VIAL GLASS SM (MISCELLANEOUS) ×2 IMPLANT
DRSG COVADERM PLUS 2X2 (GAUZE/BANDAGES/DRESSINGS) IMPLANT
GAUZE PACKING 2X5 YD STRL (GAUZE/BANDAGES/DRESSINGS) ×2 IMPLANT
GAUZE SPONGE 4X4 16PLY XRAY LF (GAUZE/BANDAGES/DRESSINGS) IMPLANT
GLOVE BIO SURGEON STRL SZ7.5 (GLOVE) ×2 IMPLANT
GLOVE BIOGEL PI IND STRL 7.0 (GLOVE) ×1 IMPLANT
GLOVE BIOGEL PI IND STRL 7.5 (GLOVE) ×1 IMPLANT
GLOVE BIOGEL PI INDICATOR 7.0 (GLOVE) ×1
GLOVE BIOGEL PI INDICATOR 7.5 (GLOVE) ×1
GOWN STRL REUS W/TWL LRG LVL3 (GOWN DISPOSABLE) ×4 IMPLANT
LIQUID BAND (GAUZE/BANDAGES/DRESSINGS) ×2 IMPLANT
NEEDLE HYPO 22GX1.5 SAFETY (NEEDLE) ×2 IMPLANT
NEEDLE SPNL 22GX3.5 QUINCKE BK (NEEDLE) ×2 IMPLANT
NS IRRIG 1000ML POUR BTL (IV SOLUTION) ×2 IMPLANT
PACK VAGINAL WOMENS (CUSTOM PROCEDURE TRAY) ×2 IMPLANT
SET CYSTO W/LG BORE CLAMP LF (SET/KITS/TRAYS/PACK) ×2 IMPLANT
SLING TRANS VAGINAL TAPE (Sling) ×1 IMPLANT
SLING UTERINE/ABD GYNECARE TVT (Sling) ×1 IMPLANT
SUT MNCRL AB 3-0 PS2 27 (SUTURE) IMPLANT
SUT MNCRL AB 4-0 PS2 18 (SUTURE) IMPLANT
SUT VIC AB 0 CT1 27 (SUTURE) ×1
SUT VIC AB 0 CT1 27XBRD ANBCTR (SUTURE) ×1 IMPLANT
SUT VIC AB 2-0 SH 27 (SUTURE) ×4
SUT VIC AB 2-0 SH 27XBRD (SUTURE) ×4 IMPLANT
TOWEL OR 17X24 6PK STRL BLUE (TOWEL DISPOSABLE) ×4 IMPLANT
TRAY FOLEY CATH SILVER 14FR (SET/KITS/TRAYS/PACK) ×2 IMPLANT
WATER STERILE IRR 1000ML POUR (IV SOLUTION) ×2 IMPLANT

## 2015-01-05 NOTE — Addendum Note (Signed)
Addendum  created 01/05/15 1757 by Graciela HusbandsWynn O Rafaella Kole, CRNA   Modules edited: Notes Section   Notes Section:  File: [id number]; Pend: [id number]

## 2015-01-05 NOTE — Anesthesia Procedure Notes (Signed)
Date/Time: 01/05/2015 8:58 AM Performed by: Suella GroveMOORE, Skylor Schnapp C Pre-anesthesia Checklist: Patient identified, Timeout performed, Emergency Drugs available, Suction available and Patient being monitored Patient Re-evaluated:Patient Re-evaluated prior to inductionOxygen Delivery Method: Circle system utilized and Simple face mask Preoxygenation: Pre-oxygenation with 100% oxygen Intubation Type: IV induction Ventilation: Mask ventilation without difficulty LMA: LMA with gastric port inserted LMA Size: 4.0 Grade View: Grade II Tube type: Oral Number of attempts: 1 Tube secured with: Tape

## 2015-01-05 NOTE — H&P (View-Only) (Signed)
Priscilla [name] is a 49 y.o. female  P: 3-0-2-3 presents for placement of tension free vaginal tape because of incontinence that is predominantly stress urinary incontinence. For the past year the patient has had leaking with physical activity, coughing sneezing and laughing with symptoms becoming progressively worse.  She denies dysuria, changes in bowel function, or dyspareunia.  She occasionally will feel as though her bladder is not empty and from time to time will have urinary urgency.  Menstrual periods are monthly with a 7 day flow in which she changes her pad every 2 hours.  Though she has cramping for 2 of her 7 day flow (that she rates as 7/10 on a 10 point scale)  she finds relief with Tylenol.  A review of medical and surgical management options were given to the patient regarding management of her symptoms however,  she wishes to proceed with placement of tension free vaginal tape.   Past Medical History  OB History:G: 5;   P: 3-0-2-3;  C-sections 1989,  1992 and 2001  GYN History: menarche: 49YO    LMP: 12/11/14    Contracepton bilateral tubal ligation  The patient denies history of sexually transmitted disease.  Denies history of abnormal PAP smear.   Last PAP smear: October 2016-normal  Medical History: Atrial Fibrillation managed with Pacemaker;  Hypertension and Thyroid Disease  Surgical History: 1991 Dilatation and Curettage;  2001 Tubal Sterilization;  2003  Laparotomy for Removal of Abdominal Mass; 2010 Endometrial Ablation;  2015 Cardiac Ablation  Denies problems with anesthesia or history of blood transfusions  Family History: Hypertension,  Heart Disease, Rheumatic Fever, Stroke and Breast Cancer  Social History: Married and a Homemaker;  Denies tobacco use but occasionally uses alcohol   Medication:  Losartan 25 mg daily Metoprolol Tartrate 50 mg daily Synthroid 25 mcg daily  No Known Allergies   Denies sensitivity to peanuts, shellfish, soy, latex or  adhesives.   ROS: Admits to glasses/contact lenses;  Denies headache, vision changes, nasal congestion, dysphagia, tinnitus, dizziness, hoarseness, cough,  chest pain, shortness of breath, nausea, vomiting, diarrhea,constipation,  urinary frequency, dysuria, hematuria, vaginitis symptoms, pelvic pain, swelling of joints,easy bruising,  myalgias, arthralgias, skin rashes, unexplained weight loss and except as is mentioned in the history of present illness, patient's review of systems is otherwise negative.   Physical Exam  Bp: 116/74  P: 80  R:24  Temperature: 97.7 degrees F orally  Weight: 236 lbs.  Height: 5'2"  BMI: 43.2  Neck: supple without masses or thyromegaly Lungs: clear to auscultation Heart: regular rate and rhythm Abdomen: soft, non-tender and no organomegaly Pelvic:EGBUS- wnl; vagina-normal rugae; uterus-normal size, (exam limited by habitus)  cervix without lesions or motion tenderness; adnexae-no tenderness or masses Extremities:  no clubbing, cyanosis or edema   Assesment: Stress Urinary Incontinence   Disposition: Reviewed the risks of surgery to include, but not limited to: reaction to anesthesia, damage to adjacent organs, infection, worsening of incontinence symptoms, excessive bleeding and erosion of vaginal tape. The patient verbalized understanding of these risks and has consented to proceed with Placement of Tension Free Vaginal Tape on 01/05/2015 with Dr. Aadhira Heffernan.  CSN# [id number]   [place]ira J. Powell, PA-C  for Dr. Shannell Mikkelsen Y. Twylia Oka  

## 2015-01-05 NOTE — Anesthesia Postprocedure Evaluation (Signed)
Anesthesia Post Note  Patient: Priscilla [name]  Procedure(s) Performed: Procedure(s) (LRB): TRANSVAGINAL TAPE (TVT) PROCEDURE (N/A) CYSTOSCOPY (N/A)  Patient location during evaluation: PACU Anesthesia Type: General Level of consciousness: awake and alert Pain management: pain level controlled Vital Signs Assessment: post-procedure vital signs reviewed and stable Respiratory status: spontaneous breathing, nonlabored ventilation, respiratory function stable and patient connected to nasal cannula oxygen Cardiovascular status: blood pressure returned to baseline and stable Postop Assessment: no signs of nausea or vomiting Anesthetic complications: no    Last Vitals:  Filed Vitals:   01/05/15 0817 01/05/15 1007  BP: 109/71 156/68  Pulse: 60 60  Temp: 36.7 C 36.8 C  Resp: 18 14    Last Pain: There were no vitals filed for this visit.               Shelton SilvasKevin D Lucrecia Mcphearson

## 2015-01-05 NOTE — Op Note (Signed)
Preop Diagnosis: SUI  Postop Diagnosis: SUI  Procedure:1.TVT 2. Cystoscopy  Fluids: 1300 cc  EBL: 50 cc  UOP: 200 cc  Complications:none  Procedure:The patient was taken to the operating room after the risks, benefits and alternatives were discussed with patient, the patient verbalized understanding and consent signed and witnessed. The patient was placed under general anesthesia and prepped and draped in the normal sterile fashion in the dorsal lithotomy position. A weighted speculum was placed in the patient's vagina and the anterior vaginal wall was injected with dilute pitressin at a concentration of 20 units of pitressin in a total of 100cc of normal saline.  An incision was made in the anterior wall of the vagina for approximately 1cm beneath the midurethra and the underlying tissue was dissected away from the anterior vaginal wall down to the level of the lower symphysis pubis bilaterally. Attention was then turned to the mons pubis where two 5 mm incisions were made 2 fingerbreadths from the midline. The transabdominal guide was then passed through the mons pubis incision on the patient's right down through the space of Retzius and out through the anterior vaginal wall after deflecting the rigid urethral catheter guide to the ipsilateral side. The same was done on the contralateral side. Cystoscopy was performed and no invadvertant bladder injury was noted. The bladder was drained with a Foley while deflecting the rigid urethral catheter guide to the patient's right and the mesh was attached to the transabdominal guide and elevated up through the space of Retzius and out through the incision on the mons pubis on the ipsilateral side. The same was done on the contralateral side. Cystoscopy was performed again and no inadvertant bladder injury was noted. The 1418 French Foley was left in the urethra and a large Tresa EndoKelly was placed between the urethra and the mesh in order to leave the mesh slack  beneath the midurethra. The mesh was then cut flush with the skin at the mons pubis incisions bilaterally.  Cystoscopy was performed again and bilateral ureters were noted to efflux without difficulty. The bilateral incisions on the mons pubis were then cleaned and Liquabond applied. The anterior vaginal wall incision was repaired with 2-0 vicryl with interrupted stitches.  Vagina was packed with estrogen soaked vaginal packing.  Sponge, lap and needle count was correct.  The patient tolerated the procedure well and was returned to the recovery room in good condition.

## 2015-01-05 NOTE — Transfer of Care (Signed)
Immediate Anesthesia Transfer of Care Note  Patient: Priscilla [name]  Procedure(s) Performed: Procedure(s): TRANSVAGINAL TAPE (TVT) PROCEDURE (N/A) CYSTOSCOPY (N/A)  Patient Location: PACU  Anesthesia Type:General  Level of Consciousness: awake, alert , oriented and patient cooperative  Airway & Oxygen Therapy: Patient Spontanous Breathing and Patient connected to nasal cannula oxygen  Post-op Assessment: Report given to RN and Post -op Vital signs reviewed and stable  Post vital signs: Reviewed and stable  Last Vitals:  Filed Vitals:   01/05/15 0817  BP: 109/71  Pulse: 60  Temp: 36.7 C  Resp: 18    Complications: No apparent anesthesia complications

## 2015-01-05 NOTE — Interval H&P Note (Signed)
History and Physical Interval Note:  01/05/2015 8:33 AM  Priscilla [name]  has presented today for surgery, with the diagnosis of Incontinence  The various methods of treatment have been discussed with the patient and family. After consideration of risks, benefits and other options for treatment, the patient has consented to  Procedure(s): TRANSVAGINAL TAPE (TVT) PROCEDURE (N/A)/TENSION FREE VAGINAL TAPE AND CYSTOSCOPY as a surgical intervention .  The patient's history has been reviewed, patient examined, no change in status, stable for surgery.  I have reviewed the patient's chart and labs.  Questions were answered to the patient's satisfaction.     Purcell NailsOBERTS,Taressa Rauh Y

## 2015-01-05 NOTE — Anesthesia Postprocedure Evaluation (Signed)
Anesthesia Post Note  Patient: Priscilla [name]  Procedure(s) Performed: Procedure(s) (LRB): TRANSVAGINAL TAPE (TVT) PROCEDURE (N/A) CYSTOSCOPY (N/A)  Patient location during evaluation: [facility] Anesthesia Type: General Level of consciousness: awake and alert Pain management: satisfactory to patient Vital Signs Assessment: post-procedure vital signs reviewed and stable Respiratory status: spontaneous breathing and respiratory function stable Cardiovascular status: stable Postop Assessment: adequate PO intake and no signs of nausea or vomiting Anesthetic complications: no    Last Vitals:  Filed Vitals:   01/05/15 1216 01/05/15 1615  BP: 134/72 104/55  Pulse: 62 61  Temp: 36.9 C 36.4 C  Resp: 16 16    Last Pain:  Filed Vitals:   01/05/15 1655  PainSc: 0-No pain                 Jahmere Bramel

## 2015-01-06 DIAGNOSIS — N393 Stress incontinence (female) (male): Secondary | ICD-10-CM | POA: Diagnosis not present

## 2015-01-06 LAB — CBC
HCT: 31.5 % — ABNORMAL LOW (ref 36.0–46.0)
Hemoglobin: 10.3 g/dL — ABNORMAL LOW (ref 12.0–15.0)
MCH: 29.2 pg (ref 26.0–34.0)
MCHC: 32.7 g/dL (ref 30.0–36.0)
MCV: 89.2 fL (ref 78.0–100.0)
Platelets: 219 10*3/uL (ref 150–400)
RBC: 3.53 MIL/uL — ABNORMAL LOW (ref 3.87–5.11)
RDW: 13.8 % (ref 11.5–15.5)
WBC: 14.1 10*3/uL — ABNORMAL HIGH (ref 4.0–10.5)

## 2015-01-06 MED ORDER — CIPROFLOXACIN HCL 250 MG PO TABS: 250.0000 mg | ORAL_TABLET | Freq: Two times a day (BID) | ORAL | Status: DC

## 2015-01-06 MED ORDER — OXYCODONE-ACETAMINOPHEN 5-325 MG PO TABS: 1.0000 | ORAL_TABLET | Freq: Four times a day (QID) | ORAL | Status: DC | PRN

## 2015-01-06 MED ORDER — FAMOTIDINE 20 MG PO TABS
20.0000 mg | ORAL_TABLET | Freq: Two times a day (BID) | ORAL | Status: DC | PRN
  Administered 2015-01-06: 20 mg via ORAL
  Filled 2015-01-06: qty 1

## 2015-01-06 MED ORDER — LORAZEPAM 2 MG/ML IJ SOLN: 0.5000 mg | Freq: Once | INTRAMUSCULAR | Status: DC

## 2015-01-06 MED ORDER — IBUPROFEN 600 MG PO TABS: 600.0000 mg | ORAL_TABLET | Freq: Four times a day (QID) | ORAL | Status: DC | PRN

## 2015-01-06 NOTE — Progress Notes (Signed)
Pt voided 200cc, residual scan 60cc. Dr. Su Hiltoberts notified. Orders given to d/c home.

## 2015-01-06 NOTE — Progress Notes (Signed)
D/c instructions and prescriptions reviewed with patient and family. Pt to f/u with MD in 6 weeks appt already made. Pt d/c home, via WC, stable to private car.

## 2015-01-06 NOTE — Progress Notes (Signed)
Vaginal packing removed.Very small amount of old brown blood noted on packing,non on pad.Foley catheter removed after packing.Patient tolerated this well.Patient aware when  needs to void let nurse know.

## 2015-01-06 NOTE — Discharge Summary (Signed)
Physician Discharge Summary  Patient ID: Priscilla [name] MRN: [id number] DOB/AGE: 51-Dec-1967 51 y.o.  Admit date: 01/05/2015 Discharge date: 01/06/2015  Admission Diagnoses: Stress Incontinence  Discharge Diagnoses:  Active Problems:   Stress incontinence in female   Discharged Condition: good  Hospital Course: Patient was admitted yesterday for TVT/Cystoscopy.  Surgery went well.  Patient is voiding with slight sensation of bloating/possible retention.  She only put out small amounts but has not been putting out much since she has been here although it is adequate.  With position change patient was able to void more and felt like she had emptied better.  A bladder scan showed only 90 cc in bladder and she put out an additional 75 cc.  Minimal spotting.  Consults: None  Significant Diagnostic Studies: labs: Hgb 10.3 postop and 12.3 preop  Treatments: as above  Discharge Exam: Blood pressure 120/69, pulse 63, temperature 97.7 F (36.5 C), temperature source Oral, resp. rate 18, height 5\' 3"  (1.6 m), weight 233 lb (105.688 kg), last menstrual period 12/25/2014, SpO2 100 %. General appearance: alert and no distress Resp: clear to auscultation bilaterally Cardio: regular rate and rhythm Extremities: extremities normal, atraumatic, no cyanosis or edema and Homans sign is negative, no sign of DVT Incision/Wound:liquabond in place  Disposition: Discharge to home in good condition     Medication List    TAKE these medications        BIOTIN MAXIMUM STRENGTH 10 MG Tabs  Generic drug:  Biotin  Take 1 tablet by mouth daily.     CENTRUM SILVER ADULT 50+ PO  Take 1 tablet by mouth daily.     ciprofloxacin 250 MG tablet  Commonly known as:  CIPRO  Take 1 tablet (250 mg total) by mouth every 12 (twelve) hours. For 3 days     Fish Oil 1200 MG Caps  Take 1 capsule by mouth daily.     ibuprofen 600 MG tablet  Commonly known as:  ADVIL,MOTRIN  Take 1 tablet (600 mg total) by mouth  every 6 (six) hours as needed (mild pain).     levothyroxine 25 MCG tablet  Commonly known as:  SYNTHROID, LEVOTHROID  Take 25 mcg by mouth daily.     losartan 25 MG tablet  Commonly known as:  COZAAR  Take 25 mg by mouth daily.     Melatonin 5 MG Tabs  Take 1 tablet by mouth at bedtime as needed (for sleep).     metoprolol 50 MG tablet  Commonly known as:  LOPRESSOR  Take 50 mg by mouth 2 (two) times daily.     oxyCODONE-acetaminophen 5-325 MG tablet  Commonly known as:  PERCOCET/ROXICET  Take 1 tablet by mouth every 6 (six) hours as needed for moderate pain or severe pain (moderate to severe pain (when tolerating fluids)).         SignedPurcell Nails: Kennia Vanvorst Y 01/06/2015, 11:17 AM

## 2015-01-08 ENCOUNTER — Encounter (HOSPITAL_COMMUNITY): Payer: Self-pay | Admitting: Obstetrics and Gynecology

## 2015-01-23 DIAGNOSIS — Z9889 Other specified postprocedural states: Secondary | ICD-10-CM

## 2015-01-23 HISTORY — DX: Other specified postprocedural states: Z98.890

## 2015-01-30 ENCOUNTER — Ambulatory Visit (INDEPENDENT_AMBULATORY_CARE_PROVIDER_SITE_OTHER): Payer: Managed Care, Other (non HMO) | Admitting: *Deleted

## 2015-01-30 DIAGNOSIS — Z950 Presence of cardiac pacemaker: Secondary | ICD-10-CM | POA: Diagnosis not present

## 2015-01-30 DIAGNOSIS — Z8679 Personal history of other diseases of the circulatory system: Secondary | ICD-10-CM

## 2015-01-31 NOTE — Progress Notes (Signed)
Remote pacemaker transmission.   

## 2015-02-01 ENCOUNTER — Encounter: Payer: Self-pay | Admitting: Cardiology

## 2015-02-01 ENCOUNTER — Encounter: Payer: Self-pay | Admitting: Cardiovascular Disease

## 2015-02-01 LAB — CUP PACEART REMOTE DEVICE CHECK
Battery Remaining Longevity: 90 mo
Battery Voltage: 3.02 V
Brady Statistic AP VP Percent: 0.68 %
Brady Statistic AP VS Percent: 97.92 %
Brady Statistic AS VP Percent: 0 %
Brady Statistic AS VS Percent: 1.4 %
Brady Statistic RA Percent Paced: 98.6 %
Brady Statistic RV Percent Paced: 0.68 %
Date Time Interrogation Session: 20161227173646
Implantable Lead Implant Date: 20150207
Implantable Lead Implant Date: 20150207
Implantable Lead Location: 753859
Implantable Lead Location: 753860
Implantable Lead Model: 5076
Implantable Lead Model: 5076
Lead Channel Impedance Value: 342 Ohm
Lead Channel Impedance Value: 361 Ohm
Lead Channel Impedance Value: 437 Ohm
Lead Channel Impedance Value: 475 Ohm
Lead Channel Pacing Threshold Amplitude: 1.125 V
Lead Channel Pacing Threshold Amplitude: 1.875 V
Lead Channel Pacing Threshold Pulse Width: 0.4 ms
Lead Channel Pacing Threshold Pulse Width: 0.4 ms
Lead Channel Sensing Intrinsic Amplitude: 2.25 mV
Lead Channel Sensing Intrinsic Amplitude: 2.25 mV
Lead Channel Sensing Intrinsic Amplitude: 8.375 mV
Lead Channel Sensing Intrinsic Amplitude: 8.375 mV
Lead Channel Setting Pacing Amplitude: 2.25 V
Lead Channel Setting Pacing Amplitude: 4 V
Lead Channel Setting Pacing Pulse Width: 0.4 ms
Lead Channel Setting Sensing Sensitivity: 0.9 mV

## 2015-02-23 ENCOUNTER — Encounter: Payer: Self-pay | Admitting: Cardiovascular Disease

## 2015-02-23 ENCOUNTER — Ambulatory Visit (INDEPENDENT_AMBULATORY_CARE_PROVIDER_SITE_OTHER): Payer: Managed Care, Other (non HMO) | Admitting: Cardiovascular Disease

## 2015-02-23 VITALS — BP 130/90 | HR 68 | Ht 62.0 in | Wt 234.3 lb

## 2015-02-23 DIAGNOSIS — I480 Paroxysmal atrial fibrillation: Secondary | ICD-10-CM

## 2015-02-23 MED ORDER — METOPROLOL TARTRATE 25 MG PO TABS
25.0000 mg | ORAL_TABLET | Freq: Two times a day (BID) | ORAL | Status: DC
Start: 2015-02-23 — End: 2015-03-23

## 2015-02-23 MED ORDER — LOSARTAN POTASSIUM 50 MG PO TABS: 50.0000 mg | ORAL_TABLET | Freq: Every day | ORAL | Status: DC

## 2015-02-23 MED ORDER — METOPROLOL TARTRATE 25 MG PO TABS: 25.0000 mg | ORAL_TABLET | Freq: Two times a day (BID) | ORAL | Status: DC

## 2015-02-23 MED ORDER — APIXABAN 5 MG PO TABS: 5.0000 mg | ORAL_TABLET | Freq: Two times a day (BID) | ORAL | Status: DC

## 2015-02-23 NOTE — Patient Instructions (Addendum)
Medication Instructions:  Your physician has recommended you make the following change in your medication:  1) INCREASE Losartan to 50 mg by mouth ONCE daily 2) INCREASE Lopressor to 75 mg by mouth TWICE daily 3) START Eliquis 5 mg by mouth TWICE daily (One week of samples provided)   Labwork: none  Testing/Procedures: none  Follow-Up: You have been referred to [facility] - Rudi Coco, NP - in the next 2 weeks.   Your physician recommends that you schedule a follow-up appointment in: 1 month with [facility] Dr. Allyson Sabal would like you to check your blood pressure DAILY for the next 4 weeks.  Keep a journal of these daily BP and heart rate reading and call our office with the results.   Your physician wants you to follow-up in: 6 months with Dr. Allyson Sabal. You will receive a reminder letter in the mail two months in advance. If you don't receive a letter, please call our office to schedule the follow-up appointment.     Any Other Special Instructions Will Be Listed Below (If Applicable).     If you need a refill on your cardiac medications before your next appointment, please call your pharmacy.

## 2015-02-23 NOTE — Progress Notes (Signed)
This encounter was created in error - please disregard.

## 2015-02-23 NOTE — Addendum Note (Signed)
Addended by: Theressa Stamps on: 02/23/2015 01:43 PM   Modules accepted: Orders

## 2015-02-23 NOTE — Assessment & Plan Note (Signed)
History of hypertension blood pressure measured to 130/90. She is on metoprolol and losartan. I'm going to increase her losartan from 25-50 mg a day and her metoprolol from 50 mg twice a day to 75 mg by mouth twice a day. She will keep a blood pressure log daily for the next 30 days and will see Belenda Cruise back in the office after that for further evaluation.

## 2015-02-23 NOTE — Progress Notes (Signed)
02/23/2015 [name]   1965/06/22  [id number]  Primary Physician Gaye Alken, MD Primary Cardiologist: Runell Gess MD Roseanne Reno   HPI:  Priscilla [name] is a very pleasant 50 year old severely overweight married Caucasian female mother of 3 children who relocated from [place] to the [place] area in December because of her husband's job with [employer]. I last saw her in the office 06/29/14. Her primary care physician is Dr. Juluis Rainier. She is referred here to be established because of prior history of atrial flutter, atrial flutter ablation, stent implantation. She has basically no chronic risk factors other than hypertension and family history with father who had a stent in his 52s and died of a myocardial infarction at age 77. She has never had a heart attack or stroke. She did have atrial flutter in February of last year patient underwent cardiac catheterization and was told she had "clean plumbing". She had atrial flutter ablation and subsequent required a Medtronic pacemaker which was followed in [place]. 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 She does complain of an occasional dry hacking cough and some chest heaviness however with recent cath that was clean I suspect this is unrelated to cardiac etiology. Her most recent pacemaker download performed by Dr. Royann Shivers 02/01/15 revealed 11 episodes of atrial tachycardia/A. Fib with rates up to 300 bpm to sustain for 1.5 hours.   Current Outpatient Prescriptions  Medication Sig Dispense Refill  . Biotin (BIOTIN MAXIMUM STRENGTH) 10 MG TABS Take 1 tablet by mouth daily.    Marland Kitchen levothyroxine (SYNTHROID, LEVOTHROID) 25 MCG tablet Take 25 mcg by mouth daily.   0  . Melatonin 5 MG TABS Take 1 tablet by mouth at bedtime as needed (for sleep).     . metoprolol (LOPRESSOR) 50 MG tablet Take 50 mg by mouth 2 (two) times daily.    . Multiple Vitamins-Minerals (CENTRUM SILVER ADULT 50+ PO) Take 1 tablet by mouth daily.     . Omega-3 Fatty Acids (FISH OIL) 1200 MG CAPS Take 1 capsule by mouth daily.     No current facility-administered medications for this visit.    No Known Allergies  Social History   Social History  . Marital Status: Married    Spouse Name: N/A  . Number of Children: N/A  . Years of Education: N/A   Occupational History  . Not on file.   Social History Main Topics  . Smoking status: Never Smoker   . Smokeless tobacco: Never Used  . Alcohol Use: 1.2 - 2.4 oz/week    2-4 Glasses of wine, 0 Standard drinks or equivalent per week  . Drug Use: No  . Sexual Activity: Yes    Birth Control/ Protection: Surgical   Other Topics Concern  . Not on file   Social History Narrative     Review of Systems: General: negative for chills, fever, night sweats or weight changes.  Cardiovascular: negative for chest pain, dyspnea on exertion, edema, orthopnea, palpitations, paroxysmal nocturnal dyspnea or shortness of breath Dermatological: negative for rash Respiratory: negative for cough or wheezing Urologic: negative for hematuria Abdominal: negative for nausea, vomiting, diarrhea, bright red blood per rectum, melena, or hematemesis Neurologic: negative for visual changes, syncope, or dizziness All other systems reviewed and are otherwise negative except as noted above.    Blood pressure 130/90, pulse 68, height  (1.575 m), weight 234 lb 4.8 oz (106.278 kg).  General appearance: alert and no distress Neck: no adenopathy, no carotid  bruit, no JVD, supple, symmetrical, trachea midline and thyroid not enlarged, symmetric, no tenderness/mass/nodules Lungs: clear to auscultation bilaterally Heart: regular rate and rhythm, S1, S2 normal, no murmur, click, rub or gallop Extremities: extremities normal, atraumatic, no cyanosis or edema  EKG HEENT paced rhythm at 62 with left axis deviation. I personally reviewed his EKG  ASSESSMENT AND PLAN:   History of atrial flutter History of  fibrillation/flutter ablation and subsequent Medtronic permanent transvenous pacemaker insertion in [place]. She is followed by Dr. Royann Shivers here in our clinic. Recent elevated revealed 11 episodes of atrial tachycardia/atrial fibrillation with rates up to 300.This patients CHA2DS2-VASc Score and unadjusted Ischemic Stroke Rate (% per year) is equal to 2.2 % stroke rate/year from a score of 2. Based on this, I decided to start her on a NOAC I'm going to increase her metoprolol from 50-75 mg by mouth twice a day and refer her to Dr. Noralyn Pick at the A. Fib clinic for consideration of antiarrhythmic therapy versus re-ablation.. . Above score calculated as 1 point each if present [CHF, HTN, DM, Vascular=MI/PAD/Aortic Plaque, Age if 65-74, or Female] Above score calculated as 2 points each if present [Age > 75, or Stroke/TIA/TE]   Essential hypertension History of hypertension blood pressure measured to 130/90. She is on metoprolol and losartan. I'm going to increase her losartan from 25-50 mg a day and her metoprolol from 50 mg twice a day to 75 mg by mouth twice a day. She will keep a blood pressure log daily for the next 30 days and will see Belenda Cruise back in the office after that for further evaluation.      Runell Gess MD FACP,FACC,FAHA, [facility] 02/23/2015 11:02 AM

## 2015-02-23 NOTE — Assessment & Plan Note (Signed)
History of fibrillation/flutter ablation and subsequent Medtronic permanent transvenous pacemaker insertion in [place]. She is followed by Dr. Royann Shivers here in our clinic. Recent elevated revealed 11 episodes of atrial tachycardia/atrial fibrillation with rates up to 300.This patients CHA2DS2-VASc Score and unadjusted Ischemic Stroke Rate (% per year) is equal to 2.2 % stroke rate/year from a score of 2. Based on this, I decided to start her on a NOAC I'm going to increase her metoprolol from 50-75 mg by mouth twice a day and refer her to Dr. Noralyn Pick at the A. Fib clinic for consideration of antiarrhythmic therapy versus re-ablation.. . Above score calculated as 1 point each if present [CHF, HTN, DM, Vascular=MI/PAD/Aortic Plaque, Age if 65-74, or Female] Above score calculated as 2 points each if present [Age > 75, or Stroke/TIA/TE]

## 2015-02-26 ENCOUNTER — Encounter: Payer: Self-pay | Admitting: Cardiovascular Disease

## 2015-03-13 ENCOUNTER — Encounter: Payer: Self-pay | Admitting: Cardiovascular Disease

## 2015-03-13 ENCOUNTER — Ambulatory Visit (HOSPITAL_COMMUNITY)
Admission: RE | Admit: 2015-03-13 | Discharge: 2015-03-13 | Disposition: A | Discharge status: Home / Self Care | Payer: Managed Care, Other (non HMO) | Source: Ambulatory Visit | Attending: Nurse Practitioner | Admitting: Nurse Practitioner

## 2015-03-13 VITALS — BP 116/80 | HR 64 | Ht 62.0 in | Wt 236.6 lb

## 2015-03-13 DIAGNOSIS — Z823 Family history of stroke: Secondary | ICD-10-CM | POA: Diagnosis not present

## 2015-03-13 DIAGNOSIS — R0789 Other chest pain: Secondary | ICD-10-CM | POA: Insufficient documentation

## 2015-03-13 DIAGNOSIS — Z8679 Personal history of other diseases of the circulatory system: Secondary | ICD-10-CM | POA: Diagnosis not present

## 2015-03-13 DIAGNOSIS — Z950 Presence of cardiac pacemaker: Secondary | ICD-10-CM | POA: Diagnosis not present

## 2015-03-13 DIAGNOSIS — Z7902 Long term (current) use of antithrombotics/antiplatelets: Secondary | ICD-10-CM | POA: Insufficient documentation

## 2015-03-13 DIAGNOSIS — Z8249 Family history of ischemic heart disease and other diseases of the circulatory system: Secondary | ICD-10-CM | POA: Diagnosis not present

## 2015-03-13 DIAGNOSIS — I10 Essential (primary) hypertension: Secondary | ICD-10-CM | POA: Diagnosis not present

## 2015-03-13 DIAGNOSIS — E039 Hypothyroidism, unspecified: Secondary | ICD-10-CM | POA: Diagnosis not present

## 2015-03-13 DIAGNOSIS — Z79899 Other long term (current) drug therapy: Secondary | ICD-10-CM | POA: Diagnosis not present

## 2015-03-13 DIAGNOSIS — E669 Obesity, unspecified: Secondary | ICD-10-CM | POA: Insufficient documentation

## 2015-03-13 DIAGNOSIS — I4892 Unspecified atrial flutter: Secondary | ICD-10-CM | POA: Insufficient documentation

## 2015-03-13 DIAGNOSIS — Z6841 Body Mass Index (BMI) 40.0 and over, adult: Secondary | ICD-10-CM | POA: Insufficient documentation

## 2015-03-14 ENCOUNTER — Other Ambulatory Visit (HOSPITAL_COMMUNITY): Payer: Self-pay | Admitting: *Deleted

## 2015-03-14 ENCOUNTER — Encounter (HOSPITAL_COMMUNITY): Payer: Self-pay | Admitting: Nurse Practitioner

## 2015-03-14 NOTE — Progress Notes (Signed)
Patient ID: Priscilla [name], female   DOB: 04-10-1965, 50 y.o.   MRN: [id number]     Primary Care Physician: Gaye Alken, MD Referring Physician: Dr. Allyson Sabal Cardiologist: Dr. Billie Lade is a 50 y.o. female with a h/o aflutter ablation and PPM in the [place] area,2/16, with nml LHC,  before moving to this area last year. She had a recent download of her device which showed some aflutter and she was asked to be seen in the afib clinci for consideration of antiaaythmic vrs repeat ablation.Marland Kitchen She was also started on apixaban 5 mg bid and metorpolol was increased to 75 mg bid from 50 mg bid. She was interrogated again today and her overall burden is 0.4%. She reports that she has a pressure in her chest today but she is in a paced rhythm and not in aflutter. She feels that the pressure has been worse since starting the apixaban(chadsvasc score of two(female, HTN) and increasing the metoprolol. She reports that she had constant chest pressure for months last year until the resting heart rate was adjusted from the 80's down to 60 but that sensation has returned.  She is not aware of most of her episodes of aflutter.    Today, she denies symptoms of palpitations, chest pain, shortness of breath, orthopnea, PND, lower extremity edema, dizziness, presyncope, syncope, or neurologic sequela. The patient is tolerating medications without difficulties and is otherwise without complaint today.   Past Medical History  Diagnosis Date  . History of atrial flutter     status post a flutter ablation and subsequent Medtronic pacemaker, battery 2015  . Hypertension   . Hypothyroidism   . Pacemaker 08/01/2014    Dual chamber Medtronic advisa MRI conditional implanted February 2015  . History of colonoscopy 01/23/15  . History of mammogram 11/13/14   Past Surgical History  Procedure Laterality Date  . Cesarean section      x3  . Atrial ablation surgery      for a flutter, required pace maker  post procedure  . Pacemaker insertion  03/2013  . Cardiac catheterization  03/2013  . Bladder suspension N/A 01/05/2015    Procedure: TRANSVAGINAL TAPE (TVT) PROCEDURE;  Surgeon: Osborn Coho, MD;  Location: [facility];  Service: Gynecology;  Laterality: N/A;  . Cystoscopy N/A 01/05/2015    Procedure: CYSTOSCOPY;  Surgeon: Osborn Coho, MD;  Location: [facility];  Service: Gynecology;  Laterality: N/A;    Current Outpatient Prescriptions  Medication Sig Dispense Refill  . apixaban (ELIQUIS) 5 MG TABS tablet Take 1 tablet (5 mg total) by mouth 2 (two) times daily. 60 tablet 1  . Biotin (BIOTIN MAXIMUM STRENGTH) 10 MG TABS Take 1 tablet by mouth daily.    Marland Kitchen estradiol (ESTRACE) 0.1 MG/GM vaginal cream Place 1 Applicatorful vaginally 2 (two) times a week.    . levothyroxine (SYNTHROID, LEVOTHROID) 25 MCG tablet Take 25 mcg by mouth daily.   0  . losartan (COZAAR) 50 MG tablet Take 1 tablet (50 mg total) by mouth daily. 30 tablet 1  . Melatonin 5 MG TABS Take 1 tablet by mouth at bedtime as needed (for sleep).     . metoprolol (LOPRESSOR) 50 MG tablet Take 50 mg by mouth 2 (two) times daily.    . metoprolol tartrate (LOPRESSOR) 25 MG tablet Take 1 tablet (25 mg total) by mouth 2 (two) times daily. 60 tablet 1  . Multiple Vitamins-Minerals (CENTRUM SILVER ADULT 50+ PO) Take 1 tablet by mouth daily.    Marland Kitchen  Omega-3 Fatty Acids (FISH OIL) 1200 MG CAPS Take 1 capsule by mouth daily.     No current facility-administered medications for this encounter.    No Known Allergies  Social History   Social History  . Marital Status: Married    Spouse Name: N/A  . Number of Children: N/A  . Years of Education: N/A   Occupational History  . Not on file.   Social History Main Topics  . Smoking status: Never Smoker   . Smokeless tobacco: Never Used  . Alcohol Use: 1.2 - 2.4 oz/week    2-4 Glasses of wine, 0 Standard drinks or equivalent per week  . Drug Use: No  . Sexual Activity: Yes    Birth Control/  Protection: Surgical   Other Topics Concern  . Not on file   Social History Narrative    Family History  Problem Relation Age of Onset  . Stroke Mother     x3  . Rheumatic fever Mother   . Heart attack Father 49  . Cancer Maternal Grandmother     breast  . Heart attack Paternal Grandfather 50    ROS- All systems are reviewed and negative except as per the HPI above  Physical Exam: Filed Vitals:   03/13/15 1129  BP: 116/80  Pulse: 64  Height:  (1.575 m)  Weight: 236 lb 9.6 oz (107.321 kg)    GEN- The patient is well appearing, alert and oriented x 3 today.   Head- normocephalic, atraumatic Eyes-  Sclera clear, conjunctiva pink Ears- hearing intact Oropharynx- clear Neck- supple, no JVP Lymph- no cervical lymphadenopathy Lungs- Clear to ausculation bilaterally, normal work of breathing Heart- Regular rate and rhythm, no murmurs, rubs or gallops, PMI not laterally displaced GI- soft, NT, ND, + BS Extremities- no clubbing, cyanosis, or edema MS- no significant deformity or atrophy Skin- no rash or lesion Psych- euthymic mood, full affect Neuro- strength and sensation are intact  EKG- atrial paced with prolonged AV conduction, LAD, pr int 294 ms, QRS in 128 ms, QTc 433 ms Epic records reviewed Interrogation reveals 0.4% a flutter burden, most episoeds pt is not aware of  Assessment and Plan: 1. A flutter S/p ablation Reviewed interrogation with Dr. Johney Frame who feels with such a low burden  and pt feeling asymptomatic with most episodes, that repeat ablation nor antiarrythmics are not needed at this time.  2. Chest pressure Nml LHC 2/16 Unsure of etiology but got worse after start of apixaban and increase of metoprolol Will decrease metoprolol back to previous dose of 50 mg bid Continue apixaban  3. PPM Per remote checks  4. Obesity Pt has been going to the gym and altering diet and has lost 25 lbs Congratulated on efforts  Lupita Leash [facility] [address] [place], [place] [address] [phone]  afib clinic as needed She will be given a f/u appointment with Dr. Johney Frame for further  evaluation

## 2015-03-15 ENCOUNTER — Encounter: Payer: Managed Care, Other (non HMO) | Admitting: Internal Medicine

## 2015-03-22 ENCOUNTER — Encounter: Payer: Self-pay | Admitting: Pharmacist Clinician (PhC)/ Clinical Pharmacy Specialist

## 2015-03-22 ENCOUNTER — Ambulatory Visit (INDEPENDENT_AMBULATORY_CARE_PROVIDER_SITE_OTHER): Payer: Managed Care, Other (non HMO) | Admitting: Pharmacist Clinician (PhC)/ Clinical Pharmacy Specialist

## 2015-03-22 VITALS — BP 130/82 | HR 64 | Ht 62.0 in | Wt 232.6 lb

## 2015-03-22 DIAGNOSIS — I10 Essential (primary) hypertension: Secondary | ICD-10-CM | POA: Diagnosis not present

## 2015-03-22 MED ORDER — APIXABAN 5 MG PO TABS: 5.0000 mg | ORAL_TABLET | Freq: Two times a day (BID) | ORAL | Status: DC

## 2015-03-22 NOTE — Assessment & Plan Note (Signed)
Priscilla [name] is doing fine on her current regimen of losartan 50 mg daily and metoprolol 50 mg twice daily.   Her home BP cuff, an Omron wrist monitor, read within 5 points of the office reading.  I have asked that she continue to check her BP at home, though not necessarily daily.  If she notices an increase in the readings to > 140 on a more regular basis, she can call for [place] to make adjustments.

## 2015-03-22 NOTE — Patient Instructions (Signed)
Your blood pressure today is 130/82   Check your blood pressure at home several days each week and keep record of the readings.  Take your BP meds as follows:continue with all current medication  Bring all of your meds, your BP cuff and your record of home blood pressures to your next appointment.  Exercise as you're able, try to walk approximately 30 minutes per day.  Keep salt intake to a minimum, especially watch canned and prepared boxed foods.  Eat more fresh fruits and vegetables and fewer canned items.  Avoid eating in fast food restaurants.    HOW TO TAKE YOUR BLOOD PRESSURE: . Rest 5 minutes before taking your blood pressure. .  Don't smoke or drink caffeinated beverages for at least 30 minutes before. . Take your blood pressure before (not after) you eat. . Sit comfortably with your back supported and both feet on the floor (don't cross your legs). . Elevate your arm to heart level on a table or a desk. . Use the proper sized cuff. It should fit smoothly and snugly around your bare upper arm. There should be enough room to slip a fingertip under the cuff. The bottom edge of the cuff should be 1 inch above the crease of the elbow. . Ideally, take 3 measurements at one sitting and record the average.

## 2015-03-22 NOTE — Progress Notes (Signed)
     03/22/2015 [name] 1965/12/29 [id number]   HPI:  Priscilla [name] is a 50 y.o. female patient of Dr Allyson Sabal, with a PMH below who presents today for hypertension clinic evaluation.  She saw Dr. Allyson Sabal last month and had a pressure of 130/90.  He chose to increase her losartan to 50 mg once daily.  Since then she has seen Lupita Leash at the AF clinic and will see Dr. Johney Frame later this week.    Cardiac Hx: AF ablation, pacemaker placement  Family Hx: father had CABG, MI, stents; paternal grandfather and uncle both with MI as well; mother had rheumatic fever early in life, and later 3 strokes, died ate age 38.    Social Hx: no tobacco, occasional alcohol, does drink caffeine (soda, tea) daily  Diet: eats most meals at home and does add salt at table, although is trying to use more Ms. Dash or other seasonings  Exercise: tries to do treadmill 30 minutes about 5 times per week, has not done as much recently after holidays and vacation  Home BP readings:  Checked daily, systolic range 105-158, with only 2/24 readings > 140.  Diastolic range 60-92 with only 2/24 readings > 90.  Current antihypertensive medications: losartan 50 mg  Intolerances: none  Wt Readings from Last 3 Encounters:  03/22/15 232 lb 9.6 oz (105.507 kg)  03/13/15 236 lb 9.6 oz (107.321 kg)  02/23/15 234 lb 4.8 oz (106.278 kg)   BP Readings from Last 3 Encounters:  03/22/15 130/82  03/13/15 116/80  02/23/15 130/90   Pulse Readings from Last 3 Encounters:  03/22/15 64  03/13/15 64  02/23/15 68    Current Outpatient Prescriptions  Medication Sig Dispense Refill  . apixaban (ELIQUIS) 5 MG TABS tablet Take 1 tablet (5 mg total) by mouth 2 (two) times daily. 180 tablet 1  . Biotin (BIOTIN MAXIMUM STRENGTH) 10 MG TABS Take 1 tablet by mouth daily.    Marland Kitchen estradiol (ESTRACE) 0.1 MG/GM vaginal cream Place 1 Applicatorful vaginally 2 (two) times a week.    . levothyroxine (SYNTHROID, LEVOTHROID) 25 MCG tablet Take 25 mcg by  mouth daily.   0  . losartan (COZAAR) 50 MG tablet Take 1 tablet (50 mg total) by mouth daily. 30 tablet 1  . Melatonin 5 MG TABS Take 1 tablet by mouth at bedtime as needed (for sleep).     . metoprolol (LOPRESSOR) 50 MG tablet Take 50 mg by mouth 2 (two) times daily.    . metoprolol tartrate (LOPRESSOR) 25 MG tablet Take 1 tablet (25 mg total) by mouth 2 (two) times daily. 60 tablet 1  . Multiple Vitamins-Minerals (CENTRUM SILVER ADULT 50+ PO) Take 1 tablet by mouth daily.    . Omega-3 Fatty Acids (FISH OIL) 1200 MG CAPS Take 1 capsule by mouth daily.     No current facility-administered medications for this visit.    No Known Allergies  Past Medical History  Diagnosis Date  . History of atrial flutter     status post a flutter ablation and subsequent Medtronic pacemaker, battery 2015  . Hypertension   . Hypothyroidism   . Pacemaker 08/01/2014    Dual chamber Medtronic advisa MRI conditional implanted February 2015  . History of colonoscopy 01/23/15  . History of mammogram 11/13/14    Blood pressure 130/82, pulse 64, height  (1.575 m), weight 232 lb 9.6 oz (105.507 kg).    Phillips Hay PharmD CPP [facility]

## 2015-03-23 ENCOUNTER — Encounter: Payer: Self-pay | Admitting: Internal Medicine

## 2015-03-23 ENCOUNTER — Telehealth: Payer: Self-pay | Admitting: *Deleted

## 2015-03-23 ENCOUNTER — Ambulatory Visit
Admission: RE | Admit: 2015-03-23 | Discharge: 2015-03-23 | Disposition: A | Discharge status: Home / Self Care | Payer: Managed Care, Other (non HMO) | Source: Ambulatory Visit | Attending: Internal Medicine | Admitting: Internal Medicine

## 2015-03-23 ENCOUNTER — Ambulatory Visit (INDEPENDENT_AMBULATORY_CARE_PROVIDER_SITE_OTHER): Payer: Managed Care, Other (non HMO) | Admitting: Internal Medicine

## 2015-03-23 VITALS — BP 144/92 | HR 87 | Ht 62.0 in | Wt 232.6 lb

## 2015-03-23 DIAGNOSIS — I480 Paroxysmal atrial fibrillation: Secondary | ICD-10-CM

## 2015-03-23 DIAGNOSIS — I495 Sick sinus syndrome: Secondary | ICD-10-CM | POA: Insufficient documentation

## 2015-03-23 DIAGNOSIS — I4819 Other persistent atrial fibrillation: Secondary | ICD-10-CM | POA: Insufficient documentation

## 2015-03-23 DIAGNOSIS — R0789 Other chest pain: Secondary | ICD-10-CM

## 2015-03-23 DIAGNOSIS — Z950 Presence of cardiac pacemaker: Secondary | ICD-10-CM

## 2015-03-23 DIAGNOSIS — R5383 Other fatigue: Secondary | ICD-10-CM

## 2015-03-23 DIAGNOSIS — I10 Essential (primary) hypertension: Secondary | ICD-10-CM

## 2015-03-23 DIAGNOSIS — Z8679 Personal history of other diseases of the circulatory system: Secondary | ICD-10-CM

## 2015-03-23 LAB — CUP PACEART INCLINIC DEVICE CHECK
Battery Remaining Longevity: 88 mo
Battery Voltage: 3.01 V
Brady Statistic AP VP Percent: 0.52 %
Brady Statistic AP VS Percent: 99.34 %
Brady Statistic AS VP Percent: 0 %
Brady Statistic AS VS Percent: 0.15 %
Brady Statistic RA Percent Paced: 99.85 %
Brady Statistic RV Percent Paced: 0.52 %
Date Time Interrogation Session: 20170217105017
Implantable Lead Implant Date: 20150207
Implantable Lead Implant Date: 20150207
Implantable Lead Location: 753859
Implantable Lead Location: 753860
Implantable Lead Model: 5076
Implantable Lead Model: 5076
Lead Channel Impedance Value: 323 Ohm
Lead Channel Impedance Value: 342 Ohm
Lead Channel Impedance Value: 418 Ohm
Lead Channel Impedance Value: 475 Ohm
Lead Channel Pacing Threshold Amplitude: 1.25 V
Lead Channel Pacing Threshold Amplitude: 1.75 V
Lead Channel Pacing Threshold Pulse Width: 0.4 ms
Lead Channel Pacing Threshold Pulse Width: 0.4 ms
Lead Channel Sensing Intrinsic Amplitude: 1.5 mV
Lead Channel Sensing Intrinsic Amplitude: 1.5 mV
Lead Channel Sensing Intrinsic Amplitude: 10.125 mV
Lead Channel Sensing Intrinsic Amplitude: 8.125 mV
Lead Channel Setting Pacing Amplitude: 2.5 V
Lead Channel Setting Pacing Amplitude: 3.75 V
Lead Channel Setting Pacing Pulse Width: 0.4 ms
Lead Channel Setting Sensing Sensitivity: 0.9 mV

## 2015-03-23 NOTE — Patient Instructions (Signed)
Medication Instructions:  Your physician recommends that you continue on your current medications as directed. Please refer to the Current Medication list given to you today.   Labwork: None ordered   Testing/Procedures: A chest x-ray takes a picture of the organs and structures inside the chest, including the heart, lungs, and blood vessels. This test can show several things, including, whether the heart is enlarges; whether fluid is building up in the lungs; and whether pacemaker / defibrillator leads are still in place.  Your physician has recommended that you have a sleep study. This test records several body functions during sleep, including: brain activity, eye movement, oxygen and carbon dioxide blood levels, heart rate and rhythm, breathing rate and rhythm, the flow of air through your mouth and nose, snoring, body muscle movements, and chest and belly movement.     Follow-Up: Your physician recommends that you schedule a follow-up appointment as needed with Dr Johney Frame      Any Other Special Instructions Will Be Listed Below (If Applicable).     If you need a refill on your cardiac medications before your next appointment, please call your pharmacy.

## 2015-03-23 NOTE — Telephone Encounter (Signed)
Left message for patient to call to discuss steps for setting up a sleep study.

## 2015-03-23 NOTE — Progress Notes (Signed)
Electrophysiology Office Note   Date:  03/23/2015   ID:  Priscilla [name], DOB 1965-11-10, MRN [id number]  PCP:  Gaye Alken, MD  Cardiologist:  Dr Allyson Sabal Pacemaker followed by Dr Royann Shivers Referred by AF clinic  Chief Complaint  Patient presents with  . H/O atrial flutter     History of Present Illness: Priscilla [name] is a 50 y.o. female who presents today for electrophysiology evaluation.   The patient has a h/o afib and atrial flutter for which she apparently underwent atrial flutter ablation in 2015 (records not currently available).  She had atrial standstill afterwards and therefore underwent PPM implant.  She had chest discomfort for several months afterwards.  She moved to [facility] and was seen by Dr Royann Shivers for pacemaker care and Dr Allyson Sabal for general cardiology.  Her pacing rate was reduced and her chest pain initially improved.  She was noted to have a low burden of afib and atrial tachycardia and was referred to the AF clinic.  She was seen by Rudi Coco NP and it was felt that her afib/ atach burden was very low and that her chest pain was out of proportion.  Her metoprolol was reduced to  BID.  Her chest pain has since resolved.  She is currently doing well and is without compliant today.  She is referred for EP evaluation.   Today, she denies symptoms of palpitations, shortness of breath, orthopnea, PND, lower extremity edema, claudication, dizziness, presyncope, syncope, bleeding, or neurologic sequela. The patient is tolerating medications without difficulties and is otherwise without complaint today.    Past Medical History  Diagnosis Date  . Atrial flutter (HCC) 2015    status post a flutter ablation and subsequent Medtronic pacemaker, battery 2015  . Hypertension   . Hypothyroidism   . Sick sinus syndrome (HCC) 08/01/2014    Dual chamber Medtronic advisa MRI conditional implanted February 2015  . History of colonoscopy 01/23/15  . History of mammogram  11/13/14  . Atrial fibrillation (HCC) 2015  . Obese    Past Surgical History  Procedure Laterality Date  . Cesarean section      x3  . Atrial flutter ablation  2015    in [place]  . Pacemaker insertion  03/2013    Medtronic Advisa DR MRI implanted in [facility] for sick sinus syndrome  . Cardiac catheterization  03/2013  . Bladder suspension N/A 01/05/2015    Procedure: TRANSVAGINAL TAPE (TVT) PROCEDURE;  Surgeon: Osborn Coho, MD;  Location: [facility];  Service: Gynecology;  Laterality: N/A;  . Cystoscopy N/A 01/05/2015    Procedure: CYSTOSCOPY;  Surgeon: Osborn Coho, MD;  Location: [facility];  Service: Gynecology;  Laterality: N/A;     Current Outpatient Prescriptions  Medication Sig Dispense Refill  . apixaban (ELIQUIS) 5 MG TABS tablet Take 1 tablet (5 mg total) by mouth 2 (two) times daily. 180 tablet 1  . Biotin (BIOTIN MAXIMUM STRENGTH) 10 MG TABS Take 1 tablet by mouth daily.    Marland Kitchen estradiol (ESTRACE) 0.1 MG/GM vaginal cream Place 1 Applicatorful vaginally 2 (two) times a week.    . levothyroxine (SYNTHROID, LEVOTHROID) 25 MCG tablet Take 25 mcg by mouth daily.   0  . losartan (COZAAR) 50 MG tablet Take 1 tablet (50 mg total) by mouth daily. 30 tablet 1  . Melatonin 5 MG TABS Take 1 tablet by mouth at bedtime as needed (for sleep).     . metoprolol (LOPRESSOR) 50 MG tablet Take 50 mg by mouth 2 (two)  times daily.    . Multiple Vitamins-Minerals (CENTRUM SILVER ADULT 50+ PO) Take 1 tablet by mouth daily.    . Omega-3 Fatty Acids (FISH OIL) 1200 MG CAPS Take 1 capsule by mouth daily.     No current facility-administered medications for this visit.    Allergies:   Review of patient's allergies indicates no known allergies.   Social History:  The patient  reports that she has never smoked. She has never used smokeless tobacco. She reports that she drinks about 1.2 - 2.4 oz of alcohol per week. She reports that she does not use illicit drugs.   Family History:  The patient's family  history includes Cancer in her maternal grandmother; Heart attack (age of onset: 71) in her paternal grandfather; Heart attack (age of onset: 50) in her father; Rheumatic fever in her mother; Stroke in her mother.    ROS:  Please see the history of present illness.   All other systems are reviewed and negative.    PHYSICAL EXAM: VS:  BP 144/92 mmHg  Pulse 87  Ht 5\' 2"  (1.575 m)  Wt 232 lb 9.6 oz (105.507 kg)  BMI 42.53 kg/m2 , BMI Body mass index is 42.53 kg/(m^2). GEN: overweight, in no acute distress HEENT: normal Neck: no JVD, carotid bruits, or masses Cardiac: RRR; no murmurs, rubs, or gallops,no edema  Respiratory:  clear to auscultation bilaterally, normal work of breathing GI: soft, nontender, nondistended, + BS MS: no deformity or atrophy Skin: warm and dry, device pocket is well healed Neuro:  Strength and sensation are intact Psych: euthymic mood, full affect  Device interrogation is reviewed today in detail.  See PaceArt for details.   Recent Labs: 12/26/2014: BUN 15; Creatinine, Ser 0.98; Potassium 4.0; Sodium 138 01/06/2015: Hemoglobin 10.3*; Platelets 219    Lipid Panel  No results found for: CHOL, TRIG, HDL, CHOLHDL, VLDL, LDLCALC, LDLDIRECT   Wt Readings from Last 3 Encounters:  03/23/15 232 lb 9.6 oz (105.507 kg)  03/22/15 232 lb 9.6 oz (105.507 kg)  03/13/15 236 lb 9.6 oz (107.321 kg)      Other studies Reviewed: Additional studies/ records that were reviewed today include: AF clinic notes  Review of the above records today demonstrates: recent echo   ASSESSMENT AND PLAN:  1.  Chest pain I agree with Rudi Coco NP that her symptoms are not due to atrial arrhythmias.  She does not V pace and thus pacemaker syndrome is unlikely.  Could consider pacemaker lead microperforation though her pain has resolved presently with reduced metoprolol.  I think that her symptoms are quite atypical.  CXR today is reviewed and reveals stable pacing leads.  No  further EP workup planned.  2. Afib/ atrial tachycardia She has no underlying atrial rhythm today.  This suggests significant atriopathy.  I anticipate that she will continue to have progressive atrial arrhythmias in the future.  Further ablation would carry low success and likely very little benefit.  If she has robust increase in atrial arrhythmias, could consider AAD therapy in the future, however at this time, I would recommend that she be followed conservatively.  Continue long term anticoagulation.  3. Sick sinus syndrome Normal pacemaker function though thresholds appear to be slightly elevated chronically. cxr is reviewed today See Arita Miss Art report She will follow with Dr Royann Shivers going forward  4. HTN Stable No change required today  5. Obesity Lifestyle modification including regular exercise and weight reduction was encouraged  6. Snoring She does snore and  has fatigue I have therefore ordered a sleep study  Follow-up with Drs Allyson Sabal and Croitoru going forward.  I will see as needed  Current medicines are reviewed at length with the patient today.   The patient does not have concerns regarding her medicines.  The following changes were made today:  none  Labs/ tests ordered today include:  Orders Placed This Encounter  Procedures  . DG Chest 2 View  . Implantable device check  . Split night study     Signed, Hillis Range, MD  03/23/2015 10:12 PM     [facility] [address] [place] [address] [phone] (office) [phone] (fax)

## 2015-03-23 NOTE — Telephone Encounter (Signed)
No precert is required through insurance. Will schedule sleep study with lab.

## 2015-03-23 NOTE — Telephone Encounter (Signed)
Patient is aware of the process, and that I am calling her insurance company to see if prior authorization is needed.

## 2015-04-02 ENCOUNTER — Encounter: Payer: Self-pay | Admitting: Internal Medicine

## 2015-04-13 ENCOUNTER — Encounter: Payer: Self-pay | Admitting: Cardiovascular Disease

## 2015-05-01 ENCOUNTER — Ambulatory Visit (INDEPENDENT_AMBULATORY_CARE_PROVIDER_SITE_OTHER): Payer: Managed Care, Other (non HMO) | Admitting: *Deleted

## 2015-05-01 ENCOUNTER — Telehealth: Payer: Self-pay | Admitting: Cardiology

## 2015-05-01 DIAGNOSIS — I495 Sick sinus syndrome: Secondary | ICD-10-CM

## 2015-05-01 NOTE — Progress Notes (Signed)
Remote pacemaker transmission.   

## 2015-05-01 NOTE — Telephone Encounter (Signed)
Spoke with pt and reminded pt of remote transmission that is due today. Pt verbalized understanding.   

## 2015-05-11 LAB — CUP PACEART REMOTE DEVICE CHECK
Battery Remaining Longevity: 84 mo
Battery Voltage: 3.01 V
Brady Statistic AP VP Percent: 1.39 %
Brady Statistic AP VS Percent: 97.81 %
Brady Statistic AS VP Percent: 0.03 %
Brady Statistic AS VS Percent: 0.77 %
Brady Statistic RA Percent Paced: 99.21 %
Brady Statistic RV Percent Paced: 1.42 %
Date Time Interrogation Session: 20170328163140
Implantable Lead Implant Date: 20150207
Implantable Lead Implant Date: 20150207
Implantable Lead Location: 753859
Implantable Lead Location: 753860
Implantable Lead Model: 5076
Implantable Lead Model: 5076
Lead Channel Impedance Value: 342 Ohm
Lead Channel Impedance Value: 342 Ohm
Lead Channel Impedance Value: 418 Ohm
Lead Channel Impedance Value: 456 Ohm
Lead Channel Pacing Threshold Amplitude: 1.125 V
Lead Channel Pacing Threshold Amplitude: 1.5 V
Lead Channel Pacing Threshold Pulse Width: 0.4 ms
Lead Channel Pacing Threshold Pulse Width: 0.4 ms
Lead Channel Sensing Intrinsic Amplitude: 3.25 mV
Lead Channel Sensing Intrinsic Amplitude: 3.25 mV
Lead Channel Sensing Intrinsic Amplitude: 9 mV
Lead Channel Sensing Intrinsic Amplitude: 9 mV
Lead Channel Setting Pacing Amplitude: 2.5 V
Lead Channel Setting Pacing Amplitude: 4 V
Lead Channel Setting Pacing Pulse Width: 0.4 ms
Lead Channel Setting Sensing Sensitivity: 0.9 mV

## 2015-05-15 ENCOUNTER — Ambulatory Visit (HOSPITAL_BASED_OUTPATIENT_CLINIC_OR_DEPARTMENT_OTHER): Payer: Managed Care, Other (non HMO) | Attending: Internal Medicine | Admitting: Radiology

## 2015-05-15 ENCOUNTER — Encounter: Payer: Self-pay | Admitting: Cardiology

## 2015-05-15 DIAGNOSIS — Z79899 Other long term (current) drug therapy: Secondary | ICD-10-CM | POA: Diagnosis not present

## 2015-05-15 DIAGNOSIS — Z6841 Body Mass Index (BMI) 40.0 and over, adult: Secondary | ICD-10-CM | POA: Insufficient documentation

## 2015-05-15 DIAGNOSIS — R5383 Other fatigue: Secondary | ICD-10-CM | POA: Insufficient documentation

## 2015-05-15 DIAGNOSIS — E669 Obesity, unspecified: Secondary | ICD-10-CM | POA: Insufficient documentation

## 2015-05-15 DIAGNOSIS — I10 Essential (primary) hypertension: Secondary | ICD-10-CM | POA: Insufficient documentation

## 2015-05-15 DIAGNOSIS — I480 Paroxysmal atrial fibrillation: Secondary | ICD-10-CM | POA: Diagnosis not present

## 2015-05-15 DIAGNOSIS — Z7901 Long term (current) use of anticoagulants: Secondary | ICD-10-CM | POA: Insufficient documentation

## 2015-05-18 ENCOUNTER — Telehealth: Payer: Self-pay | Admitting: Cardiology

## 2015-05-18 NOTE — Sleep Study (Signed)
    Patient Name: Priscilla [name], Priscilla [name] MRN: [id number] Study Date: 05/15/2015 Gender: Female D.O.B: 11-21-1965 Age (years): [age] Referring Provider: Hillis RangeJames Allred Interpreting Physician: Armanda Magicraci Leeon Makar MD, ABSM RPSGT: Shelah LewandowskyGregory, Kenyon  Weight (lbs): 230 BMI: 42 Height (inches): 62 Neck Size: 15.00  CLINICAL INFORMATION Sleep Study Type: NPSG Indication for sleep study: Fatigue, Hypertension, Obesity, Snoring Epworth Sleepiness Score: 5  SLEEP STUDY TECHNIQUE As per the AASM Manual for the Scoring of Sleep and Associated Events v2.3 (April 2016) with a hypopnea requiring 4% desaturations. The channels recorded and monitored were frontal, central and occipital EEG, electrooculogram (EOG), submentalis EMG (chin), nasal and oral airflow, thoracic and abdominal wall motion, anterior tibialis EMG, snore microphone, electrocardiogram, and pulse oximetry.  MEDICATIONS Patient's medications include: Eliquis, Synthroid, Melatonin, Losartan, Metoprolol. Medications self-administered by patient during sleep study : Sleep medicine administered - Unspecified at 10:31:41 PM  SLEEP ARCHITECTURE The study was initiated at 10:31:37 PM and ended at 4:46:18 AM. Sleep onset time was 25.3 minutes and the sleep efficiency was reduced at 64.6%. The total sleep time was 242.0 minutes. Stage REM latency was 121.5 minutes. The patient spent 9.50% of the night in stage N1 sleep, 70.87% in stage N2 sleep, 0.00% in stage N3 and 19.63% in REM. Alpha intrusion was absent. Supine sleep was 35.17%.  RESPIRATORY PARAMETERS The overall apnea/hypopnea index (AHI) was 3.0 per hour. There were 0 total apneas, including 0 obstructive, 0 central and 0 mixed apneas. There were 12 hypopneas and 22 RERAs. The AHI during Stage REM sleep was 12.6 per hour. AHI while supine was 1.4 per hour. The mean oxygen saturation was 96.00%. The minimum SpO2 during sleep was 89.00%. Soft snoring was noted during this study.  CARDIAC  DATA The 2 lead EKG demonstrated AV pacing. The mean heart rate was 60.06 beats per minute. Other EKG findings include: None.  LEG MOVEMENT DATA The total PLMS were 31 with a resulting PLMS index of 7.69. Associated arousal with leg movement index was 2.2 .  IMPRESSIONS - No significant obstructive sleep apnea occurred during this study (AHI = 3.0/h). - No significant central sleep apnea occurred during this study (CAI = 0.0/h). - The patient had minimal or no oxygen desaturation during the study (Min O2 = 89.00%) - The patient snored with Soft snoring volume. - No cardiac abnormalities were noted during this study. - Mild periodic limb movements of sleep occurred during the study. No significant associated arousals.  DIAGNOSIS - Snoring  RECOMMENDATIONS - Avoid alcohol, sedatives and other CNS depressants that may results in sleep apnea and disrupt normal sleep architecture. - Sleep hygiene should be reviewed to assess factors that may improve sleep quality. - Weight management and regular exercise should be initiated or continued if appropriate.   Quintella ReichertURNER,Leeland Lovelady R Diplomate, American Board of Sleep Medicine  ELECTRONICALLY SIGNED ON:  05/18/2015, 1:56 PM [facility] PH: [phone]   FX: [phone] ACCREDITED BY THE AMERICAN ACADEMY OF SLEEP MEDICINE

## 2015-05-18 NOTE — Telephone Encounter (Signed)
Please let patient know that sleep study showed no significant sleep apnea.    

## 2015-05-18 NOTE — Telephone Encounter (Signed)
Patient informed of information.  Stated verbal understanding  

## 2015-08-01 ENCOUNTER — Telehealth: Payer: Self-pay | Admitting: Cardiovascular Disease

## 2015-08-01 NOTE — Telephone Encounter (Signed)
New Message  Pt calling to speak w/ RN- would not give nature of phone call. Please call back and discuss.

## 2015-08-01 NOTE — Telephone Encounter (Signed)
Returned call to patient.Kristin's recommendations given. 

## 2015-08-01 NOTE — Telephone Encounter (Signed)
Returned call to patient.She stated she has been having long menstrual cycles lasting 13 days and 15 days.GYN wanted her to ask if any contraindications with Eliquis and Progesterone.Message sent to our pharmacist for advice.

## 2015-08-01 NOTE — Telephone Encounter (Signed)
Would be okay to try bursts of progestin.  Long term use will increase risk of clot formation, but she should be good for short term to correct menstrual problems

## 2015-08-17 ENCOUNTER — Encounter: Payer: Self-pay | Admitting: Cardiovascular Disease

## 2015-08-20 ENCOUNTER — Encounter: Payer: Self-pay | Admitting: Cardiovascular Disease

## 2015-08-20 ENCOUNTER — Ambulatory Visit (INDEPENDENT_AMBULATORY_CARE_PROVIDER_SITE_OTHER): Payer: Managed Care, Other (non HMO) | Admitting: Cardiovascular Disease

## 2015-08-20 VITALS — BP 128/82 | HR 65 | Ht 62.0 in | Wt 243.6 lb

## 2015-08-20 DIAGNOSIS — I495 Sick sinus syndrome: Secondary | ICD-10-CM

## 2015-08-20 DIAGNOSIS — I480 Paroxysmal atrial fibrillation: Secondary | ICD-10-CM | POA: Diagnosis not present

## 2015-08-20 DIAGNOSIS — N920 Excessive and frequent menstruation with regular cycle: Secondary | ICD-10-CM

## 2015-08-20 DIAGNOSIS — I10 Essential (primary) hypertension: Secondary | ICD-10-CM | POA: Diagnosis not present

## 2015-08-20 DIAGNOSIS — Z950 Presence of cardiac pacemaker: Secondary | ICD-10-CM

## 2015-08-20 MED ORDER — DILTIAZEM HCL ER 120 MG PO CP24: 120.0000 mg | ORAL_CAPSULE | Freq: Every day | ORAL | Status: DC

## 2015-08-20 NOTE — Patient Instructions (Signed)
Dr Royann Shiversroitoru has recommended making the following medication changes: 1. STOP Losartan 2. START Diltiazem 120 mg - take 1 tablet by mouth once daily  Remote monitoring is used to monitor your Pacemaker of ICD from home. This monitoring reduces the number of office visits required to check your device to one time per year. It allows us to keep an eye on the functioning of your device to ensure it is working properly. You are scheduled for a device check from home on Monday, October 16th, 2017. You may send your transmission at any time that day. If you have a wireless device, the transmission will be sent automatically. After your physician reviews your transmission, you will receive a postcard with your next transmission date.  Dr Royann Shiversroitoru recommends that you schedule a follow-up appointment in 12 months with a pacemaker check. You will receive a reminder letter in the mail two months in advance. If you don't receive a letter, please call our office to schedule the follow-up appointment.  If you need a refill on your cardiac medications before your next appointment, please call your pharmacy.

## 2015-08-21 ENCOUNTER — Encounter: Payer: Self-pay | Admitting: Cardiovascular Disease

## 2015-08-21 DIAGNOSIS — N920 Excessive and frequent menstruation with regular cycle: Secondary | ICD-10-CM | POA: Insufficient documentation

## 2015-08-21 LAB — CUP PACEART INCLINIC DEVICE CHECK
Battery Remaining Longevity: 81 mo
Battery Voltage: 3.01 V
Brady Statistic AP VP Percent: 1.95 %
Brady Statistic AP VS Percent: 96.98 %
Brady Statistic AS VP Percent: 0.05 %
Brady Statistic AS VS Percent: 1.02 %
Brady Statistic RA Percent Paced: 98.93 %
Brady Statistic RV Percent Paced: 2 %
Date Time Interrogation Session: 20170717132909
Implantable Lead Implant Date: 20150207
Implantable Lead Implant Date: 20150207
Implantable Lead Location: 753859
Implantable Lead Location: 753860
Implantable Lead Model: 5076
Implantable Lead Model: 5076
Lead Channel Impedance Value: 323 Ohm
Lead Channel Impedance Value: 323 Ohm
Lead Channel Impedance Value: 418 Ohm
Lead Channel Impedance Value: 437 Ohm
Lead Channel Pacing Threshold Amplitude: 1.25 V
Lead Channel Pacing Threshold Amplitude: 1.75 V
Lead Channel Pacing Threshold Pulse Width: 0.4 ms
Lead Channel Pacing Threshold Pulse Width: 0.4 ms
Lead Channel Sensing Intrinsic Amplitude: 3.125 mV
Lead Channel Sensing Intrinsic Amplitude: 3.125 mV
Lead Channel Sensing Intrinsic Amplitude: 8.625 mV
Lead Channel Sensing Intrinsic Amplitude: 8.625 mV
Lead Channel Setting Pacing Amplitude: 2.5 V
Lead Channel Setting Pacing Amplitude: 3.5 V
Lead Channel Setting Pacing Pulse Width: 0.4 ms
Lead Channel Setting Sensing Sensitivity: 0.9 mV

## 2015-08-21 NOTE — Progress Notes (Signed)
Cardiology Office Note    Date:  08/21/2015   ID:  Priscilla [name], DOB [birth year], MRN [id number]  PCP:  Gaye Alken, MD  Cardiologist: Nanetta Batty , M.D.; Thurmon Fair, MD   Chief Complaint  Patient presents with  . Follow-up    pt c/o chest pressure today and headache all weekend    History of Present Illness:  Priscilla [name] is a 50 y.o. female with a history of atrial flutter/fibrillation and previous radiofrequency ablation followed by atrial standstill and need for pacemaker implantation. She is here for pacemaker follow-up.   She has not had any significant change in arrhythmia burden since her last appointment. Pacemaker interrogation today shows 0.8% atrial tachycardia arrhythmia, mostly atrial flutter with variable AV block at least a couple of episodes of atrial fib. The ventricular rate is poorly controlled with average ventricular rates in the 120-170 range. She is really not aware of the palpitations.  Over the weekend she has some problems with headaches and chest pressure. She has not had any atrial arrhythmia since July 6. She has a history of normal coronary arteries by previous angiography. In the past, her chest discomfort seemed to improve when beta blocker dose was reduced and her AV delay was liberalized to reduce the incidence of ventricular pacing.  Her biggest complaint is that of menorrhagia. Her periods last for roughly 2 weeks. She has been discussing ways to alleviate this with her gynecologist Dr. Su Hilt..  Her dual chamber Medtronic advise a pacemaker implant in 2015 still has roughly 6.5 years of estimated longevity. She has 97% atrial pacing and only 2% ventricular pacing. As before, her pacing thresholds are a little high for new device, but not significantly changed in longitudinal follow-up.  Dr. Johney Frame EP evaluation February 2017: "... significant atriopathy. I anticipate that she will continue to have progressive atrial arrhythmias in  the future. Further ablation would carry low success and likely very little benefit. If she has robust increase in atrial arrhythmias, could consider AAD therapy in the future...."  Past Medical History  Diagnosis Date  . Atrial flutter (HCC) 2015    status post a flutter ablation and subsequent Medtronic pacemaker, battery 2015  . Hypertension   . Hypothyroidism   . Sick sinus syndrome (HCC) 08/01/2014    Dual chamber Medtronic advisa MRI conditional implanted February 2015  . History of colonoscopy 01/23/15  . History of mammogram 11/13/14  . Atrial fibrillation (HCC) 2015  . Obese     Past Surgical History  Procedure Laterality Date  . Cesarean section      x3  . Atrial flutter ablation  2015    in [place]  . Pacemaker insertion  03/2013    Medtronic Advisa DR MRI implanted in [facility] for sick sinus syndrome  . Cardiac catheterization  03/2013  . Bladder suspension N/A 01/05/2015    Procedure: TRANSVAGINAL TAPE (TVT) PROCEDURE;  Surgeon: Osborn Coho, MD;  Location: [facility];  Service: Gynecology;  Laterality: N/A;  . Cystoscopy N/A 01/05/2015    Procedure: CYSTOSCOPY;  Surgeon: Osborn Coho, MD;  Location: [facility];  Service: Gynecology;  Laterality: N/A;    Current Medications: Outpatient Prescriptions Prior to Visit  Medication Sig Dispense Refill  . apixaban (ELIQUIS) 5 MG TABS tablet Take 1 tablet (5 mg total) by mouth 2 (two) times daily. 180 tablet 1  . levothyroxine (SYNTHROID, LEVOTHROID) 25 MCG tablet Take 25 mcg by mouth daily.   0  . Melatonin 5 MG TABS Take 1  tablet by mouth at bedtime as needed (for sleep).     . metoprolol (LOPRESSOR) 50 MG tablet Take 50 mg by mouth 2 (two) times daily.    . Multiple Vitamins-Minerals (CENTRUM SILVER ADULT 50+ PO) Take 1 tablet by mouth daily.    . Omega-3 Fatty Acids (FISH OIL) 1200 MG CAPS Take 1 capsule by mouth daily.    Marland Kitchen. losartan (COZAAR) 50 MG tablet Take 1 tablet (50 mg total) by mouth daily. 30 tablet 1  . Biotin  (BIOTIN MAXIMUM STRENGTH) 10 MG TABS Take 1 tablet by mouth daily.    Marland Kitchen. estradiol (ESTRACE) 0.1 MG/GM vaginal cream Place 1 Applicatorful vaginally 2 (two) times a week.     No facility-administered medications prior to visit.     Allergies:   Review of patient's allergies indicates no known allergies.   Social History   Social History  . Marital Status: Married    Spouse Name: N/A  . Number of Children: N/A  . Years of Education: N/A   Social History Main Topics  . Smoking status: Never Smoker   . Smokeless tobacco: Never Used  . Alcohol Use: 1.2 - 2.4 oz/week    2-4 Glasses of wine, 0 Standard drinks or equivalent per week  . Drug Use: No  . Sexual Activity: Yes    Birth Control/ Protection: Surgical   Other Topics Concern  . Not on file   Social History Narrative     Family History:  The patient's family history includes Cancer in her maternal grandmother; Heart attack (age of onset: 1950) in her paternal grandfather; Heart attack (age of onset: 2072) in her father; Rheumatic fever in her mother; Stroke in her mother.   ROS:   Please see the history of present illness.    ROS All other systems reviewed and are negative.   PHYSICAL EXAM:   VS:  BP 128/82 mmHg  Pulse 65  Ht 5\' 2"  (1.575 m)  Wt 110.496 kg (243 lb 9.6 oz)  BMI 44.54 kg/m2   GEN: Well nourished, well developed, in no acute distress HEENT: normal Neck: no JVD, carotid bruits, or masses Cardiac: RRR; no murmurs, rubs, or gallops,no edema , healthy subclavian pacemaker site Respiratory:  clear to auscultation bilaterally, normal work of breathing GI: soft, nontender, nondistended, + BS MS: no deformity or atrophy Skin: warm and dry, no rash Neuro:  Alert and Oriented x 3, Strength and sensation are intact Psych: euthymic mood, full affect  Wt Readings from Last 3 Encounters:  08/20/15 110.496 kg (243 lb 9.6 oz)  05/15/15 104.327 kg (230 lb)  03/23/15 105.507 kg (232 lb 9.6 oz)      Studies/Labs  Reviewed:   EKG:  EKG is ordered today.  The ekg ordered today demonstrates Atrial paced ventricular sensed rhythm. There is a long AV delay of roughly 300 ms. She has left axis deviation and poor R-wave progression. The QRS is moderately prolonged at 126 ms in a nonspecific pattern. QTC 434 ms.  Recent Labs: 12/26/2014: BUN 15; Creatinine, Ser 0.98; Potassium 4.0; Sodium 138 01/06/2015: Hemoglobin 10.3*; Platelets 219   Lipid Panel No results found for: CHOL, TRIG, HDL, CHOLHDL, VLDL, LDLCALC, LDLDIRECT  Additional studies/ records that were reviewed today include:  Records from Dr. Johney FrameAllred , Dr. Allyson SabalBerry    ASSESSMENT:    1. Sick sinus syndrome (HCC)   2. Paroxysmal atrial fibrillation (HCC)   3. Pacemaker   4. Essential hypertension   5. Morbid obesity due to  excess calories (HCC)   6. Menorrhagia with regular cycle      PLAN:  In order of problems listed above:  1. SSS: He has virtually atrial standstill due to a combination of atrophic with he and previous ablation procedures. Her heart rate histogram distribution is favorable suggesting appropriate sensor programming she also has evidence of prolonged AV conduction time but with MVP programming has minimal ventricular pacing. She seems to tolerate ventricular pacing poorly with complaints of chest discomfort. 2. AFib: currently the burden of arrhythmia is very low, but rate control is mediocre at best. Dr. Jenel Lucks evaluation and recommendations noted. She did not do well with higher doses of metoprolol in the past. Will add diltiazem. To avoid excessive reduction in blood pressure we can decrease the dose of losartan. 3. PPM: Normal device function. Continue remote monitoring every 3 months and office visits yearly 4. HTN: Well controlled. Will add diltiazem. To avoid excessive reduction in blood pressure we can decrease the dose of losartan. 5. Weight loss recommended 6. While I would recommend avoiding estrogen-containing  hormone preparations, she may benefit from progesterone based oral or intrauterine medications to reduce bleeding.    Medication Adjustments/Labs and Tests Ordered: Current medicines are reviewed at length with the patient today.  Concerns regarding medicines are outlined above.  Medication changes, Labs and Tests ordered today are listed in the Patient Instructions below. Patient Instructions  Dr Royann Shivers has recommended making the following medication changes: 1. STOP Losartan 2. START Diltiazem 120 mg - take 1 tablet by mouth once daily  Remote monitoring is used to monitor your Pacemaker of ICD from home. This monitoring reduces the number of office visits required to check your device to one time per year. It allows [place] to keep an eye on the functioning of your device to ensure it is working properly. You are scheduled for a device check from home on Monday, October 16th, 2017. You may send your transmission at any time that day. If you have a wireless device, the transmission will be sent automatically. After your physician reviews your transmission, you will receive a postcard with your next transmission date.  Dr Royann Shivers recommends that you schedule a follow-up appointment in 12 months with a pacemaker check. You will receive a reminder letter in the mail two months in advance. If you don't receive a letter, please call our office to schedule the follow-up appointment.  If you need a refill on your cardiac medications before your next appointment, please call your pharmacy.    Signed, Thurmon Fair, MD  08/21/2015 8:33 AM    [facility] [address] [place], [place], [place]  [address] Phone: [phone]; Fax: [phone]

## 2015-09-18 ENCOUNTER — Ambulatory Visit (INDEPENDENT_AMBULATORY_CARE_PROVIDER_SITE_OTHER): Payer: Managed Care, Other (non HMO) | Admitting: Cardiovascular Disease

## 2015-09-18 ENCOUNTER — Encounter: Payer: Self-pay | Admitting: Cardiovascular Disease

## 2015-09-18 DIAGNOSIS — Z8679 Personal history of other diseases of the circulatory system: Secondary | ICD-10-CM | POA: Diagnosis not present

## 2015-09-18 DIAGNOSIS — I10 Essential (primary) hypertension: Secondary | ICD-10-CM

## 2015-09-18 MED ORDER — DILTIAZEM HCL ER COATED BEADS 120 MG PO CP24: 120.0000 mg | ORAL_CAPSULE | Freq: Every day | ORAL | 3 refills | Status: DC

## 2015-09-18 NOTE — Patient Instructions (Signed)

## 2015-09-18 NOTE — Assessment & Plan Note (Signed)
History of hypertension blood pressure measured at 112/78. She is on diltiazem and metoprolol. Continue current meds at current dosing

## 2015-09-18 NOTE — Progress Notes (Signed)
09/18/2015 Priscilla [name]   [birth year]  [id number]  Primary Physician Gaye AlkenBARNES,ELIZABETH STEWART, MD Primary Cardiologist: Runell GessJonathan J Pope Brunty MD Roseanne RenoFACP, FACC, FAHA, FSCAI  HPI:  Priscilla [name] is a very pleasant 50 year old severely overweight married Caucasian female mother of 3 children [place] because of her husband's job with [employer]. I last saw her in the office 02/23/15. Her primary care physician is Dr. Juluis RainierElizabeth Barnes. She is referred here to be established because of prior history of atrial flutter, atrial flutter ablation, stent implantation. She has basically no chronic risk factors other than hypertension and family history with father who had a stent in his 560s and died of a myocardial infarction at age 52. She has never had a heart attack or stroke. She did have atrial flutter in February of last year patient underwent cardiac catheterization and was told she had "clean plumbing". She had atrial flutter ablation and subsequent required a Medtronic pacemaker which was followed in [place]. 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 She does complain of an occasional dry hacking cough and some chest heaviness however with recent cath that was clean I suspect this is unrelated to cardiac etiology. Her most recent pacemaker download performed by Dr. Royann Shiversroitoru 02/01/15 revealed 11 episodes of atrial tachycardia/A. Fib with rates up to 300 bpm to sustain for 1.5 hours. She saw Dr. Johney FrameAllred who felt she was not a candidate for repeat ablation. She is on oral anticoagulation. Dr. Royann Shiversroitoru recently changed her losartan to diltiazem.   Current Outpatient Prescriptions  Medication Sig Dispense Refill  . apixaban (ELIQUIS) 5 MG TABS tablet Take 1 tablet (5 mg total) by mouth 2 (two) times daily. 180 tablet 1  . diltiazem (DILACOR XR) 120 MG 24 hr capsule Take 1 capsule (120 mg total) by mouth daily. 30 capsule 11  . levothyroxine (SYNTHROID, LEVOTHROID) 25  MCG tablet Take 25 mcg by mouth daily.   0  . Melatonin 5 MG TABS Take 1 tablet by mouth at bedtime as needed (for sleep).     . metoprolol (LOPRESSOR) 50 MG tablet Take 50 mg by mouth 2 (two) times daily.    . Multiple Vitamins-Minerals (CENTRUM SILVER ADULT 50+ PO) Take 1 tablet by mouth daily.    . norethindrone (AYGESTIN) 5 MG tablet Take 1 tablet by mouth daily.    . Omega-3 Fatty Acids (FISH OIL) 1200 MG CAPS Take 1 capsule by mouth daily.    . Vitamin D, Ergocalciferol, (DRISDOL) 50000 units CAPS capsule Take 50,000 Units by mouth every 7 (seven) days.     No current facility-administered medications for this visit.     No Known Allergies  Social History   Social History  . Marital status: Married    Spouse name: N/A  . Number of children: N/A  . Years of education: N/A   Occupational History  . Not on file.   Social History Main Topics  . Smoking status: Never Smoker  . Smokeless tobacco: Never Used  . Alcohol use 1.2 - 2.4 oz/week    2 - 4 Glasses of wine per week  . Drug use: No  . Sexual activity: Yes    Birth control/ protection: Surgical   Other Topics Concern  . Not on file   Social History Narrative  . No narrative on file     Review of Systems: General: negative for chills, fever, night sweats or weight changes.  Cardiovascular: negative for  chest pain, dyspnea on exertion, edema, orthopnea, palpitations, paroxysmal nocturnal dyspnea or shortness of breath Dermatological: negative for rash Respiratory: negative for cough or wheezing Urologic: negative for hematuria Abdominal: negative for nausea, vomiting, diarrhea, bright red blood per rectum, melena, or hematemesis Neurologic: negative for visual changes, syncope, or dizziness All other systems reviewed and are otherwise negative except as noted above.    Blood pressure 112/78, pulse 72, height 5\' 2"  (1.575 m), weight 241 lb 3.2 oz (109.4 kg).  General appearance: alert and no distress Neck: no  adenopathy, no carotid bruit, no JVD, supple, symmetrical, trachea midline and thyroid not enlarged, symmetric, no tenderness/mass/nodules Lungs: clear to auscultation bilaterally Heart: regular rate and rhythm, S1, S2 normal, no murmur, click, rub or gallop Extremities: extremities normal, atraumatic, no cyanosis or edema  EKG not performed today  ASSESSMENT AND PLAN:   History of atrial flutter History of atrial flutter status post ablation in the past and subsequent implantation of a Medtronic pacemaker currently followed by Dr. Royann Shiversroitoru. Interrogation has revealed atrial fib in the past. She is on Eliquis  oral anticoagulation. She saw Dr. Johney FrameAllred who felt she was not a candidate for repeat ablation  Essential hypertension History of hypertension blood pressure measured at 112/78. She is on diltiazem and metoprolol. Continue current meds at current dosing      Runell GessJonathan J. Yvette Roark MD [facility] PlanoFACP,FACC,FAHA, [facility]SCAI 09/18/2015 11:32 AM

## 2015-09-18 NOTE — Addendum Note (Signed)
Addended by: Stann MainlandLARK, Cameron Schwinn O on: 09/18/2015 11:40 AM   Modules accepted: Orders

## 2015-09-18 NOTE — Assessment & Plan Note (Signed)
History of atrial flutter status post ablation in the past and subsequent implantation of a Medtronic pacemaker currently followed by Dr. Royann Shiversroitoru. Interrogation has revealed atrial fib in the past. She is on Eliquis  oral anticoagulation. She saw Dr. Johney FrameAllred who felt she was not a candidate for repeat ablation

## 2015-09-20 ENCOUNTER — Other Ambulatory Visit: Payer: Self-pay | Admitting: *Deleted

## 2015-10-18 ENCOUNTER — Telehealth: Payer: Self-pay | Admitting: Cardiovascular Disease

## 2015-10-18 NOTE — Telephone Encounter (Signed)
Returned call to [place] at [facility]-reports pt was seen today by Dr. Zachery DauerBarnes and Dr. Zachery DauerBarnes noted a heart murmur.  Dr. Zachery DauerBarnes wanting to make Dr. Allyson SabalBerry aware. Pt denied any s/sx, was being seen for shoulder pain.  Request OV note be faxed to [phone].    Will route to MD Allyson SabalBerry to make aware.

## 2015-10-18 NOTE — Telephone Encounter (Signed)
New message      Pt was seen today.  Dr Zachery DauerBarnes heard a heart murmur.  Dr Zachery DauerBarnes want to know if Dr Royann Shiversroitoru want to see pt for this. Not sure if this is a new diagnosis. Please call

## 2015-10-24 ENCOUNTER — Other Ambulatory Visit: Payer: Self-pay | Admitting: Cardiovascular Disease

## 2015-10-24 NOTE — Telephone Encounter (Signed)
Prescription esent

## 2015-11-20 ENCOUNTER — Ambulatory Visit (INDEPENDENT_AMBULATORY_CARE_PROVIDER_SITE_OTHER): Payer: Managed Care, Other (non HMO) | Admitting: *Deleted

## 2015-11-20 DIAGNOSIS — I495 Sick sinus syndrome: Secondary | ICD-10-CM | POA: Diagnosis not present

## 2015-11-20 NOTE — Progress Notes (Signed)
Remote pacemaker transmission.   

## 2015-11-21 ENCOUNTER — Encounter: Payer: Self-pay | Admitting: Cardiology

## 2015-11-30 ENCOUNTER — Telehealth: Payer: Self-pay | Admitting: *Deleted

## 2015-11-30 LAB — CUP PACEART REMOTE DEVICE CHECK
Battery Remaining Longevity: 71 mo
Battery Voltage: 3 V
Brady Statistic AP VP Percent: 1.62 %
Brady Statistic AP VS Percent: 79.58 %
Brady Statistic AS VP Percent: 1 %
Brady Statistic AS VS Percent: 17.8 %
Brady Statistic RA Percent Paced: 81.2 %
Brady Statistic RV Percent Paced: 2.62 %
Date Time Interrogation Session: 20171017095357
Implantable Lead Implant Date: 20150207
Implantable Lead Implant Date: 20150207
Implantable Lead Location: 753859
Implantable Lead Location: 753860
Implantable Lead Model: 5076
Implantable Lead Model: 5076
Lead Channel Impedance Value: 342 Ohm
Lead Channel Impedance Value: 342 Ohm
Lead Channel Impedance Value: 418 Ohm
Lead Channel Impedance Value: 437 Ohm
Lead Channel Pacing Threshold Amplitude: 1.125 V
Lead Channel Pacing Threshold Amplitude: 1.5 V
Lead Channel Pacing Threshold Pulse Width: 0.4 ms
Lead Channel Pacing Threshold Pulse Width: 0.4 ms
Lead Channel Sensing Intrinsic Amplitude: 3.375 mV
Lead Channel Sensing Intrinsic Amplitude: 3.375 mV
Lead Channel Sensing Intrinsic Amplitude: 7.875 mV
Lead Channel Sensing Intrinsic Amplitude: 7.875 mV
Lead Channel Setting Pacing Amplitude: 2.25 V
Lead Channel Setting Pacing Amplitude: 3.25 V
Lead Channel Setting Pacing Pulse Width: 0.4 ms
Lead Channel Setting Sensing Sensitivity: 0.9 mV

## 2015-11-30 NOTE — Telephone Encounter (Signed)
LMTCB/sss  Called to inquire about patient sx's. Persistent AF since early October.

## 2015-12-03 NOTE — Telephone Encounter (Signed)
Dr.Croitoru recommends that patient f/u in the office to discuss persistent AF/AFL and adjust meds for better rate control. Appt made for 11/7. Patient voiced understanding.

## 2015-12-05 ENCOUNTER — Telehealth: Payer: Self-pay

## 2015-12-05 NOTE — Telephone Encounter (Signed)
lmtcb

## 2015-12-05 NOTE — Telephone Encounter (Signed)
-----   Message from Thurmon FairMihai Croitoru, MD sent at 12/01/2015 11:35 AM EDT ----- If I'm reading her medications correctly she is currently taking diltiazem 120 mg once daily. Please tell her she is in atrial flutter for about 2 weeks and that the rate is too fast. Ask her to increase the diltiazem to 240 mg once daily and schedule for first available visit. Thanks

## 2015-12-05 NOTE — Telephone Encounter (Signed)
F/u Message ° °Pt returning RN call. Please call back to discuss  °

## 2015-12-05 NOTE — Telephone Encounter (Signed)
Patient already has an appointment scheduled for next week, 12/11/15. Patient advised of medication recommendation. Verbalized understanding and agreed with plan.

## 2015-12-11 ENCOUNTER — Encounter: Payer: Self-pay | Admitting: Cardiovascular Disease

## 2015-12-11 ENCOUNTER — Ambulatory Visit (INDEPENDENT_AMBULATORY_CARE_PROVIDER_SITE_OTHER): Payer: Managed Care, Other (non HMO) | Admitting: Cardiovascular Disease

## 2015-12-11 VITALS — BP 124/82 | HR 69 | Ht 62.0 in | Wt 255.4 lb

## 2015-12-11 DIAGNOSIS — D500 Iron deficiency anemia secondary to blood loss (chronic): Secondary | ICD-10-CM

## 2015-12-11 DIAGNOSIS — R5383 Other fatigue: Secondary | ICD-10-CM

## 2015-12-11 DIAGNOSIS — Z01812 Encounter for preprocedural laboratory examination: Secondary | ICD-10-CM

## 2015-12-11 DIAGNOSIS — I4819 Other persistent atrial fibrillation: Secondary | ICD-10-CM

## 2015-12-11 DIAGNOSIS — I10 Essential (primary) hypertension: Secondary | ICD-10-CM

## 2015-12-11 DIAGNOSIS — Z950 Presence of cardiac pacemaker: Secondary | ICD-10-CM

## 2015-12-11 DIAGNOSIS — I481 Persistent atrial fibrillation: Secondary | ICD-10-CM | POA: Diagnosis not present

## 2015-12-11 DIAGNOSIS — D689 Coagulation defect, unspecified: Secondary | ICD-10-CM

## 2015-12-11 DIAGNOSIS — I495 Sick sinus syndrome: Secondary | ICD-10-CM

## 2015-12-11 NOTE — Patient Instructions (Signed)
Dr Royann Shiversroitoru has recommended that you have a Cardioversion (DCCV) next Tuesday, 11/14. Electrical Cardioversion uses a jolt of electricity to your heart either through paddles or wired patches attached to your chest. This is a controlled, usually prescheduled, procedure. Defibrillation is done under light anesthesia in the hospital, and you usually go home the day of the procedure. This is done to get your heart back into a normal rhythm. You are not awake for the procedure. Please see the instruction sheet given to you today.  You will be required to have the following tests prior to the procedure:  1. Blood work - the blood work can be done no more than 7 days prior to the procedure. It can be done at any [facility] lab. There is one downstairs on the first floor of this building and one in the [facility] building [phone]([address], [address]).  Your physician recommends that you schedule a follow-up appointment in 2 weeks in the [facility]rial Fibrillation Clinic at [facility].  Your physician recommends that you schedule a follow-up appointment in 2 months with Dr Johney FrameAllred.  Dr Royann Shiversroitoru recommends that you schedule a follow-up appointment in 3 months.  If you need a refill on your cardiac medications before your next appointment, please call your pharmacy.

## 2015-12-11 NOTE — Progress Notes (Signed)
Cardiology Office Note    Date:  12/11/2015   ID:  Priscilla [name], DOB 27-Dec-1965, MRN [id number]  PCP:  Gaye AlkenBARNES,ELIZABETH STEWART, MD  Cardiologist: Nanetta BattyJonathan Berry , M.D.; Thurmon FairMihai Bonniejean Piano, MD ; Hillis RangeJames Allred, M.D.  Chief Complaint  Patient presents with  . Atrial Fibrillation    pt stated she have no energy, pt PCP think she hear a heart murmur    History of Present Illness:  Priscilla [name] is a 50 y.o. female with a history of atrial flutter/fibrillation and previous radiofrequency ablation followed by atrial standstill and need for pacemaker implantation.   Roughly for the last month she has been in atrial fibrillation with rapid ventricular rate. Diltiazem was added for better rate control with some improvement. Average ventricular rates were around 110 and are now down to about 90. Before atrial fibrillation onset she had atrial standstill with virtually 100% atrial pacing.   Before the current episode, burden of atrial fibrillation was around 1%. Since the atrial fibrillation began she has had shortness of breath on exertion, but she denies PND or orthopnea. She does have a dry annoying cough for about the same period of time.  There is only 3.7% ventricular pacing. Activity level is not much changed at about 3.3 hours per day. Her dual chamber Medtronic advise a pacemaker implant in 2015 still has roughly 5.5 years of estimated longevity. She has 97% atrial pacing and only 2% ventricular pacing. As before, her pacing thresholds are a little high for a new device, but not significantly changed in longitudinal follow-up.  She has a history of normal coronary arteries by previous angiography. In the past, her chest discomfort seemed to improve when beta blocker dose was reduced and her AV delay was liberalized to reduce the incidence of ventricular pacing. Echo in June 2016 showed normal left ventricular systolic function. Right heart chambers were slightly dilated, but a sleep study performed  since that time has showed no evidence of sleep apnea.  She is still having problems with menorrhagia. Her periods last for roughly 2 weeks. She has been discussing ways to alleviate this with her gynecologist Dr. Su Hiltoberts. treatment with progesterone has been as a resounding failure by the patient's report. They have been talking about endometrial ablation or even hysterectomy. She has developed iron deficiency anemia and has not taken an iron supplement. She has been compliant with her twice daily anticoagulant. He does not have a history of stroke/TIA or other embolic events.   Dr. Johney FrameAllred EP evaluation February 2017: "... significant atriopathy. I anticipate that she will continue to have progressive atrial arrhythmias in the future. Further ablation would carry low success and likely very little benefit. If she has robust increase in atrial arrhythmias, could consider AAD therapy in the future...."  Past Medical History:  Diagnosis Date  . Atrial fibrillation (HCC) 2015  . Atrial flutter (HCC) 2015   status post a flutter ablation and subsequent Medtronic pacemaker, battery 2015  . History of colonoscopy 01/23/15  . History of mammogram 11/13/14  . Hypertension   . Hypothyroidism   . Obese   . Sick sinus syndrome (HCC) 08/01/2014   Dual chamber Medtronic advisa MRI conditional implanted February 2015    Past Surgical History:  Procedure Laterality Date  . [place]TER [place]  . BLADDER SUSPENSION N/A 01/05/2015   Procedure: TRANSVAGINAL TAPE (TVT) PROCEDURE;  Surgeon: Osborn CohoAngela Roberts, MD;  Location: [facility];  Service: Gynecology;  Laterality: N/A;  .  CARDIAC CATHETERIZATION  03/2013  . CESAREAN SECTION     x3  . CYSTOSCOPY N/A 01/05/2015   Procedure: CYSTOSCOPY;  Surgeon: Osborn Coho, MD;  Location: [facility];  Service: Gynecology;  Laterality: N/A;  . PACEMAKER INSERTION  03/2013   Medtronic Advisa DR MRI implanted in [place] for sick sinus syndrome     Current Medications: Outpatient Medications Prior to Visit  Medication Sig Dispense Refill  . ELIQUIS 5 MG TABS tablet TAKE ONE TABLET BY MOUTH TWICE DAILY 60 tablet 5  . levothyroxine (SYNTHROID, LEVOTHROID) 25 MCG tablet Take 25 mcg by mouth daily.   0  . Melatonin 5 MG TABS Take 1 tablet by mouth at bedtime as needed (for sleep).     . metoprolol (LOPRESSOR) 50 MG tablet Take 50 mg by mouth 2 (two) times daily.    . Multiple Vitamins-Minerals (CENTRUM SILVER ADULT 50+ PO) Take 1 tablet by mouth daily.    . Omega-3 Fatty Acids (FISH OIL) 1200 MG CAPS Take 1 capsule by mouth daily.    Marland Kitchen diltiazem (CARDIZEM CD) 120 MG 24 hr capsule Take 1 capsule (120 mg total) by mouth daily. (Patient taking differently: Take 240 mg by mouth daily. ) 90 capsule 3  . norethindrone (AYGESTIN) 5 MG tablet Take 1 tablet by mouth daily.    Marland Kitchen diltiazem (DILACOR XR) 120 MG 24 hr capsule Take 1 capsule (120 mg total) by mouth daily. (Patient taking differently: Take 240 mg by mouth daily. ) 30 capsule 11  . Vitamin D, Ergocalciferol, (DRISDOL) 50000 units CAPS capsule Take 50,000 Units by mouth every 7 (seven) days.     No facility-administered medications prior to visit.      Allergies:   Patient has no known allergies.   Social History   Social History  . Marital status: Married    Spouse name: N/A  . Number of children: N/A  . Years of education: N/A   Social History Main Topics  . Smoking status: Never Smoker  . Smokeless tobacco: Never Used  . Alcohol use 1.2 - 2.4 oz/week    2 - 4 Glasses of wine per week  . Drug use: No  . Sexual activity: Yes    Birth control/ protection: Surgical   Other Topics Concern  . Not on file   Social History Narrative  . No narrative on file     Family History:  The patient's family history includes Cancer in her maternal grandmother; Heart attack (age of onset: 36) in her paternal grandfather; Heart attack (age of onset: 55) in her father; Rheumatic fever  in her mother; Stroke in her mother.   ROS:   Please see the history of present illness.    ROS All other systems reviewed and are negative.   PHYSICAL EXAM:   VS:  BP 124/82   Pulse 69   Ht 5\' 2"  (1.575 m)   Wt 255 lb 6.4 oz (115.8 kg)   BMI 46.71 kg/m    GEN: Well nourished, well developed, in no acute distress  HEENT: normal  Neck: no JVD, carotid bruits, or masses Cardiac: RRR; no murmurs, rubs, or gallops,no edema , healthy subclavian pacemaker site Respiratory:  clear to auscultation bilaterally, normal work of breathing GI: soft, nontender, nondistended, + BS MS: no deformity or atrophy  Skin: warm and dry, no rash Neuro:  Alert and Oriented x 3, Strength and sensation are intact Psych: euthymic mood, full affect  Wt Readings from Last 3 Encounters:  12/11/15  255 lb 6.4 oz (115.8 kg)  09/18/15 241 lb 3.2 oz (109.4 kg)  08/20/15 243 lb 9.6 oz (110.5 kg)      Studies/Labs Reviewed:   EKG:  EKG is ordered today.  The ekg ordered today demonstrates Atypical atrial flutter with a cycle length of 250 ms and mostly ventricular paced rhythm with a couple of native AV conduction/fused beats. Even natively conducted beats have a broad QRS, most closely resembling right bundle branch block with left anterior fascicular block. QTC 533 ms.  Recent Labs: 12/26/2014: BUN 15; Creatinine, Ser 0.98; Potassium 4.0; Sodium 138 01/06/2015: Hemoglobin 10.3; Platelets 219   Lipid Panel No results found for: CHOL, TRIG, HDL, CHOLHDL, VLDL, LDLCALC, LDLDIRECT  Additional studies/ records that were reviewed today include:  Records from Dr. Johney FrameAllred , Dr. Allyson SabalBerry    ASSESSMENT:    1. Persistent atrial fibrillation (HCC)   2. Pacemaker   3. Sick sinus syndrome (HCC)   4. Essential hypertension   5. Morbid obesity (HCC)   6. Iron deficiency anemia due to chronic blood loss   7. Coagulation disorder (HCC)   8. Pre-procedure lab exam   9. Other fatigue      PLAN:  In order of  problems listed above:  1. AFib: currently In the middle of a lengthy episode of persistent atrial arrhythmia - On the EKG today probable atypical (left atrial?) flutter, on intracardiac electrograms clearly atrial fibrillation, going on for about a month now.. Dr. Jenel LucksAllred's previous evaluation and recommendations noted - did not believe repeat ablation would be a good option. She did not do well with higher doses of metoprolol in the past. Even with the addition of diltiazem rate control is suboptimal. We will schedule her for electrical cardioversion. This procedure has been fully reviewed with the patient and written informed consent has been obtained. If there is early arrhythmia recurrence, consider treatment with flecainide, Rythmol or hospitalization to start dofetilide. When choosing an antiarrhythmic medication, keep in mind that she is poorly tolerant of ventricular pacing. Avoid agents that would have a major effect on lengthening AV conduction, such as amiodarone or sotalol. LVEF was normal one year ago, 55-60%. 2. PPM: Normal device function. Continue remote monitoring every 3 months and office visits yearly. Before the onset of atrial fibrillation and she had a favorable heart rate histogram distribution, consistent with appropriate sensor settings. she also has evidence of prolonged AV conduction time but with MVP programming has minimal ventricular pacing. She seems to tolerate ventricular pacing poorly with complaints of chest discomfort. 3. SSS: He has virtually atrial standstill due to a combination of previous ablation procedures. Her heart rate histogram distribution is favorable suggesting appropriate sensor programming she also has evidence of prolonged AV conduction time but with MVP programming has minimal ventricular pacing. She seems to tolerate ventricular pacing poorly with complaints of chest discomfort. 4. HTN: Well controlled.  5. Obesity: Even in the absence of obstructive sleep  apnea her obesity increases the likelihood for recurrent atrial fibrillation. Weight loss recommended.  6. Iron deficiency anemia secondary to Menorrhagia: We discussed the fact that she will not be able to interrupt her anticoagulant for at least a month following cardioversion. I agree that she will need a more definitive solution to the current problem which might involve hysterectomy and/or endometrial ablation.  She will be scheduled for cardioversion next week with a follow-up in the A. fib clinic shortly after that. We'll go ahead and make another appointment with Dr. Johney FrameAllred  since I anticipate we will need his guidance as to the best choice for antiarrhythmic therapy.   Medication Adjustments/Labs and Tests Ordered: Current medicines are reviewed at length with the patient today.  Concerns regarding medicines are outlined above.  Medication changes, Labs and Tests ordered today are listed in the Patient Instructions below. Patient Instructions  Dr Royann Shivers has recommended that you have a Cardioversion (DCCV) next Tuesday, 11/14. Electrical Cardioversion uses a jolt of electricity to your heart either through paddles or wired patches attached to your chest. This is a controlled, usually prescheduled, procedure. Defibrillation is done under light anesthesia in the hospital, and you usually go home the day of the procedure. This is done to get your heart back into a normal rhythm. You are not awake for the procedure. Please see the instruction sheet given to you today.  You will be required to have the following tests prior to the procedure:  1. Blood work - the blood work can be done no more than 7 days prior to the procedure. It can be done at any [facility] lab. There is one downstairs on the first floor of this building and one in the [facility] building [phone]).  Your physician recommends that you schedule a follow-up appointment in 2 weeks in the Atrial  Fibrillation Clinic at [facility].  Your physician recommends that you schedule a follow-up appointment in 2 months with Dr Johney Frame.  Dr Royann Shivers recommends that you schedule a follow-up appointment in 3 months.  If you need a refill on your cardiac medications before your next appointment, please call your pharmacy.    Signed, Thurmon Fair, MD  12/11/2015 2:49 PM    [facility] [address] [place], [place], [place]  [address] Phone: [phone]; Fax: [phone]

## 2015-12-12 ENCOUNTER — Other Ambulatory Visit: Payer: Self-pay | Admitting: Cardiovascular Disease

## 2015-12-12 DIAGNOSIS — I4819 Other persistent atrial fibrillation: Secondary | ICD-10-CM

## 2015-12-12 LAB — CBC
HCT: 32.4 % — ABNORMAL LOW (ref 35.0–45.0)
Hemoglobin: 9.8 g/dL — ABNORMAL LOW (ref 11.7–15.5)
MCH: 24.4 pg — ABNORMAL LOW (ref 27.0–33.0)
MCHC: 30.2 g/dL — ABNORMAL LOW (ref 32.0–36.0)
MCV: 80.6 fL (ref 80.0–100.0)
MPV: 9.6 fL (ref 7.5–12.5)
Platelets: 345 10*3/uL (ref 140–400)
RBC: 4.02 MIL/uL (ref 3.80–5.10)
RDW: 16.1 % — ABNORMAL HIGH (ref 11.0–15.0)
WBC: 6.9 10*3/uL (ref 3.8–10.8)

## 2015-12-12 LAB — TSH: TSH: 1.7 mIU/L

## 2015-12-12 LAB — PROTIME-INR
INR: 1
Prothrombin Time: 10.5 s (ref 9.0–11.5)

## 2015-12-12 LAB — APTT: aPTT: 27 s (ref 22–34)

## 2015-12-12 LAB — BASIC METABOLIC PANEL
BUN: 10 mg/dL (ref 7–25)
CO2: 23 mmol/L (ref 20–31)
Calcium: 8.3 mg/dL — ABNORMAL LOW (ref 8.6–10.4)
Chloride: 107 mmol/L (ref 98–110)
Creat: 0.94 mg/dL (ref 0.50–1.05)
Glucose, Bld: 92 mg/dL (ref 65–99)
Potassium: 3.8 mmol/L (ref 3.5–5.3)
Sodium: 141 mmol/L (ref 135–146)

## 2015-12-14 LAB — CUP PACEART INCLINIC DEVICE CHECK
Battery Remaining Longevity: 66 mo
Date Time Interrogation Session: 20171110161058
Implantable Lead Implant Date: 20150207
Implantable Lead Implant Date: 20150207
Implantable Lead Location: 753859
Implantable Lead Location: 753860
Implantable Lead Model: 5076
Implantable Lead Model: 5076
Implantable Pulse Generator Implant Date: 20150207
Lead Channel Setting Pacing Amplitude: 2.25 V
Lead Channel Setting Pacing Amplitude: 3.25 V
Lead Channel Setting Pacing Pulse Width: 0.4 ms
Lead Channel Setting Sensing Sensitivity: 0.9 mV

## 2015-12-18 ENCOUNTER — Ambulatory Visit (HOSPITAL_COMMUNITY)
Admission: RE | Admit: 2015-12-18 | Discharge: 2015-12-18 | Disposition: A | Discharge status: Home / Self Care | Payer: Managed Care, Other (non HMO) | Source: Ambulatory Visit | Attending: Cardiovascular Disease | Admitting: Cardiovascular Disease

## 2015-12-18 ENCOUNTER — Ambulatory Visit (HOSPITAL_COMMUNITY): Payer: Managed Care, Other (non HMO) | Admitting: Anesthesiology

## 2015-12-18 ENCOUNTER — Encounter (HOSPITAL_COMMUNITY): Payer: Self-pay

## 2015-12-18 ENCOUNTER — Encounter (HOSPITAL_COMMUNITY)
Admission: RE | Disposition: A | Discharge status: Home / Self Care | Payer: Self-pay | Source: Ambulatory Visit | Attending: Cardiovascular Disease

## 2015-12-18 DIAGNOSIS — Z950 Presence of cardiac pacemaker: Secondary | ICD-10-CM | POA: Diagnosis not present

## 2015-12-18 DIAGNOSIS — I10 Essential (primary) hypertension: Secondary | ICD-10-CM | POA: Diagnosis not present

## 2015-12-18 DIAGNOSIS — Z823 Family history of stroke: Secondary | ICD-10-CM | POA: Diagnosis not present

## 2015-12-18 DIAGNOSIS — D500 Iron deficiency anemia secondary to blood loss (chronic): Secondary | ICD-10-CM | POA: Diagnosis not present

## 2015-12-18 DIAGNOSIS — E039 Hypothyroidism, unspecified: Secondary | ICD-10-CM | POA: Insufficient documentation

## 2015-12-18 DIAGNOSIS — Z9889 Other specified postprocedural states: Secondary | ICD-10-CM | POA: Insufficient documentation

## 2015-12-18 DIAGNOSIS — I4819 Other persistent atrial fibrillation: Secondary | ICD-10-CM

## 2015-12-18 DIAGNOSIS — I481 Persistent atrial fibrillation: Secondary | ICD-10-CM | POA: Diagnosis not present

## 2015-12-18 DIAGNOSIS — N920 Excessive and frequent menstruation with regular cycle: Secondary | ICD-10-CM | POA: Insufficient documentation

## 2015-12-18 DIAGNOSIS — Z6841 Body Mass Index (BMI) 40.0 and over, adult: Secondary | ICD-10-CM | POA: Insufficient documentation

## 2015-12-18 DIAGNOSIS — Z7901 Long term (current) use of anticoagulants: Secondary | ICD-10-CM | POA: Insufficient documentation

## 2015-12-18 DIAGNOSIS — Z79899 Other long term (current) drug therapy: Secondary | ICD-10-CM | POA: Insufficient documentation

## 2015-12-18 DIAGNOSIS — I4892 Unspecified atrial flutter: Secondary | ICD-10-CM | POA: Diagnosis not present

## 2015-12-18 DIAGNOSIS — Z8249 Family history of ischemic heart disease and other diseases of the circulatory system: Secondary | ICD-10-CM | POA: Insufficient documentation

## 2015-12-18 DIAGNOSIS — I495 Sick sinus syndrome: Secondary | ICD-10-CM | POA: Insufficient documentation

## 2015-12-18 HISTORY — PX: CARDIOVERSION: SHX1299

## 2015-12-18 SURGERY — CARDIOVERSION
Anesthesia: General

## 2015-12-18 MED ORDER — SODIUM CHLORIDE 0.9 % IV SOLN
INTRAVENOUS | Status: DC
  Administered 2015-12-18: 14:00:00 via INTRAVENOUS

## 2015-12-18 MED ORDER — PROPOFOL 10 MG/ML IV BOLUS
INTRAVENOUS | Status: DC | PRN
  Administered 2015-12-18: 70 mg via INTRAVENOUS

## 2015-12-18 MED ORDER — LIDOCAINE 2% (20 MG/ML) 5 ML SYRINGE
INTRAMUSCULAR | Status: DC | PRN
  Administered 2015-12-18: 20 mg via INTRAVENOUS

## 2015-12-18 NOTE — Discharge Instructions (Signed)
Electrical Cardioversion, Care After °This sheet gives you information about how to care for yourself after your procedure. Your health care provider may also give you more specific instructions. If you have problems or questions, contact your health care provider. °What can I expect after the procedure? °After the procedure, it is common to have: °· Some redness on the skin where the shocks were given. °Follow these instructions at home: °· Do not drive for 24 hours if you were given a medicine to help you relax (sedative). °· Take over-the-counter and prescription medicines only as told by your health care provider. °· Ask your health care provider how to check your pulse. Check it often. °· Rest for 48 hours after the procedure or as told by your health care provider. °· Avoid or limit your caffeine use as told by your health care provider. °Contact a health care provider if: °· You feel like your heart is beating too quickly or your pulse is not regular. °· You have a serious muscle cramp that does not go away. °Get help right away if: °· You have discomfort in your chest. °· You are dizzy or you feel faint. °· You have trouble breathing or you are short of breath. °· Your speech is slurred. °· You have trouble moving an arm or leg on one side of your body. °· Your fingers or toes turn cold or blue. °This information is not intended to replace advice given to you by your health care provider. Make sure you discuss any questions you have with your health care provider. °Document Released: 11/10/2012 Document Revised: 08/24/2015 Document Reviewed: 07/27/2015 °Elsevier Interactive Patient Education © 2017 [employer] ° °

## 2015-12-18 NOTE — Op Note (Signed)
Procedure: Electrical Cardioversion Indications:  Atrial Fibrillation  Procedure Details:  Consent: Risks of procedure as well as the alternatives and risks of each were explained to the (patient/caregiver).  Consent for procedure obtained.  Time Out: Verified patient identification, verified procedure, site/side was marked, verified correct patient position, special equipment/implants available, medications/allergies/relevent history reviewed, required imaging and test results available.  Performed  Patient placed on cardiac monitor, pulse oximetry, supplemental oxygen as necessary.  Sedation given: IV propofol, Dr. Noreene LarssonJoslin Pacer pads placed anterior and posterior chest.  Cardioverted 1 time(s).  Cardioversion with synchronized biphasic 150J shock.  Evaluation: Findings: Post procedure EKG shows: Atrial Fibrillation Complications: None Patient did tolerate procedure well.  Time Spent Directly with the Patient:  30 minutes   Priscilla [name] 12/18/2015, 2:09 PM

## 2015-12-18 NOTE — Transfer of Care (Signed)
Immediate Anesthesia Transfer of Care Note  Patient: Priscilla [name]  Procedure(s) Performed: Procedure(s): CARDIOVERSION (N/A)  Patient Location: Endoscopy Unit  Anesthesia Type:General  Level of Consciousness: awake, alert  and oriented  Airway & Oxygen Therapy: Patient Spontanous Breathing and Patient connected to nasal cannula oxygen  Post-op Assessment: Report given to RN, Post -op Vital signs reviewed and stable and Patient moving all extremities  Post vital signs: Reviewed and stable  Last Vitals:  Vitals:   12/18/15 1319 12/18/15 1415  BP: (!) 130/104 (!) 126/98  Pulse: (!) 107 62  Resp: 19 15  Temp:  37.1 C    Last Pain:  Vitals:   12/18/15 1415  TempSrc: Oral         Complications: No apparent anesthesia complications

## 2015-12-18 NOTE — Interval H&P Note (Signed)
History and Physical Interval Note:  12/18/2015 12:32 PM  Priscilla [name]  has presented today for surgery, with the diagnosis of AFIB  The various methods of treatment have been discussed with the patient and family. After consideration of risks, benefits and other options for treatment, the patient has consented to  Procedure(s): CARDIOVERSION (N/A) as a surgical intervention .  The patient's history has been reviewed, patient examined, no change in status, stable for surgery.  I have reviewed the patient's chart and labs.  Questions were answered to the patient's satisfaction.     Jeslyn Amsler

## 2015-12-18 NOTE — Anesthesia Postprocedure Evaluation (Signed)
Anesthesia Post Note  Patient: Priscilla [name]  Procedure(s) Performed: Procedure(s) (LRB): CARDIOVERSION (N/A)  Patient location during evaluation: Endoscopy Anesthesia Type: General Level of consciousness: awake, awake and alert, oriented and sedated Vital Signs Assessment: post-procedure vital signs reviewed and stable Respiratory status: respiratory function stable, spontaneous breathing, nonlabored ventilation and patient connected to nasal cannula oxygen Cardiovascular status: blood pressure returned to baseline Anesthetic complications: no    Last Vitals:  Vitals:   12/18/15 1440 12/18/15 1444  BP: 130/74 126/77  Pulse: 63 62  Resp: 14 16  Temp:      Last Pain:  Vitals:   12/18/15 1415  TempSrc: Oral                 Caily Rakers COKER

## 2015-12-18 NOTE — Anesthesia Preprocedure Evaluation (Addendum)
Anesthesia Evaluation  Patient identified by MRN, date of birth, ID band Patient awake    Reviewed: Allergy & Precautions, NPO status , Patient's Chart, lab work & pertinent test results  Airway Mallampati: II  TM Distance: >3 FB Neck ROM: Full    Dental  (+) Teeth Intact   Pulmonary    breath sounds clear to auscultation       Cardiovascular hypertension,  Rhythm:Irregular Rate:Normal     Neuro/Psych    GI/Hepatic   Endo/Other    Renal/GU      Musculoskeletal   Abdominal   Peds  Hematology   Anesthesia Other Findings   Reproductive/Obstetrics                            Anesthesia Physical Anesthesia Plan  ASA: III  Anesthesia Plan: General   Post-op Pain Management:    Induction: Intravenous  Airway Management Planned: Mask  Additional Equipment:   Intra-op Plan:   Post-operative Plan:   Informed Consent: I have reviewed the patients History and Physical, chart, labs and discussed the procedure including the risks, benefits and alternatives for the proposed anesthesia with the patient or authorized representative who has indicated his/her understanding and acceptance.     Plan Discussed with: CRNA and Anesthesiologist  Anesthesia Plan Comments:        Anesthesia Quick Evaluation  

## 2015-12-18 NOTE — H&P (View-Only) (Signed)
Cardiology Office Note    Date:  12/11/2015   ID:  Priscilla [name], DOB 27-Dec-1965, MRN [id number]  PCP:  Gaye AlkenBARNES,ELIZABETH STEWART, MD  Cardiologist: Nanetta BattyJonathan Berry , M.D.; Thurmon FairMihai Jacqulin Brandenburger, MD ; Hillis RangeJames Allred, M.D.  Chief Complaint  Patient presents with  . Atrial Fibrillation    pt stated she have no energy, pt PCP think she hear a heart murmur    History of Present Illness:  Priscilla ShoeGayle Gondek is a 50 y.o. female with a history of atrial flutter/fibrillation and previous radiofrequency ablation followed by atrial standstill and need for pacemaker implantation.   Roughly for the last month she has been in atrial fibrillation with rapid ventricular rate. Diltiazem was added for better rate control with some improvement. Average ventricular rates were around 110 and are now down to about 90. Before atrial fibrillation onset she had atrial standstill with virtually 100% atrial pacing.   Before the current episode, burden of atrial fibrillation was around 1%. Since the atrial fibrillation began she has had shortness of breath on exertion, but she denies PND or orthopnea. She does have a dry annoying cough for about the same period of time.  There is only 3.7% ventricular pacing. Activity level is not much changed at about 3.3 hours per day. Her dual chamber Medtronic advise a pacemaker implant in 2015 still has roughly 5.5 years of estimated longevity. She has 97% atrial pacing and only 2% ventricular pacing. As before, her pacing thresholds are a little high for a new device, but not significantly changed in longitudinal follow-up.  She has a history of normal coronary arteries by previous angiography. In the past, her chest discomfort seemed to improve when beta blocker dose was reduced and her AV delay was liberalized to reduce the incidence of ventricular pacing. Echo in June 2016 showed normal left ventricular systolic function. Right heart chambers were slightly dilated, but a sleep study performed  since that time has showed no evidence of sleep apnea.  She is still having problems with menorrhagia. Her periods last for roughly 2 weeks. She has been discussing ways to alleviate this with her gynecologist Dr. Su Hiltoberts. treatment with progesterone has been as a resounding failure by the patient's report. They have been talking about endometrial ablation or even hysterectomy. She has developed iron deficiency anemia and has not taken an iron supplement. She has been compliant with her twice daily anticoagulant. He does not have a history of stroke/TIA or other embolic events.   Dr. Johney FrameAllred EP evaluation February 2017: "... significant atriopathy. I anticipate that she will continue to have progressive atrial arrhythmias in the future. Further ablation would carry low success and likely very little benefit. If she has robust increase in atrial arrhythmias, could consider AAD therapy in the future...."  Past Medical History:  Diagnosis Date  . Atrial fibrillation (HCC) 2015  . Atrial flutter (HCC) 2015   status post a flutter ablation and subsequent Medtronic pacemaker, battery 2015  . History of colonoscopy 01/23/15  . History of mammogram 11/13/14  . Hypertension   . Hypothyroidism   . Obese   . Sick sinus syndrome (HCC) 08/01/2014   Dual chamber Medtronic advisa MRI conditional implanted February 2015    Past Surgical History:  Procedure Laterality Date  . ATR[place]  [place]  . BLADDER SUSPENSION N/A 01/05/2015   Procedure: TRANSVAGINAL TAPE (TVT) PROCEDURE;  Surgeon: Osborn CohoAngela Roberts, MD;  Location: [facility];  Service: Gynecology;  Laterality: N/A;  .  CARDIAC CATHETERIZATION  03/2013  . CESAREAN SECTION     x3  . CYSTOSCOPY N/A 01/05/2015   Procedure: CYSTOSCOPY;  Surgeon: Osborn Coho, MD;  Location: [facility];  Service: Gynecology;  Laterality: N/A;  . PACEMAKER INSERTION  03/2013   Medtronic Advisa DR MRI implanted in [place] for sick sinus syndrome     Current Medications: Outpatient Medications Prior to Visit  Medication Sig Dispense Refill  . ELIQUIS 5 MG TABS tablet TAKE ONE TABLET BY MOUTH TWICE DAILY 60 tablet 5  . levothyroxine (SYNTHROID, LEVOTHROID) 25 MCG tablet Take 25 mcg by mouth daily.   0  . Melatonin 5 MG TABS Take 1 tablet by mouth at bedtime as needed (for sleep).     . metoprolol (LOPRESSOR) 50 MG tablet Take 50 mg by mouth 2 (two) times daily.    . Multiple Vitamins-Minerals (CENTRUM SILVER ADULT 50+ PO) Take 1 tablet by mouth daily.    . Omega-3 Fatty Acids (FISH OIL) 1200 MG CAPS Take 1 capsule by mouth daily.    Marland Kitchen diltiazem (CARDIZEM CD) 120 MG 24 hr capsule Take 1 capsule (120 mg total) by mouth daily. (Patient taking differently: Take 240 mg by mouth daily. ) 90 capsule 3  . norethindrone (AYGESTIN) 5 MG tablet Take 1 tablet by mouth daily.    Marland Kitchen diltiazem (DILACOR XR) 120 MG 24 hr capsule Take 1 capsule (120 mg total) by mouth daily. (Patient taking differently: Take 240 mg by mouth daily. ) 30 capsule 11  . Vitamin D, Ergocalciferol, (DRISDOL) 50000 units CAPS capsule Take 50,000 Units by mouth every 7 (seven) days.     No facility-administered medications prior to visit.      Allergies:   Patient has no known allergies.   Social History   Social History  . Marital status: Married    Spouse name: N/A  . Number of children: N/A  . Years of education: N/A   Social History Main Topics  . Smoking status: Never Smoker  . Smokeless tobacco: Never Used  . Alcohol use 1.2 - 2.4 oz/week    2 - 4 Glasses of wine per week  . Drug use: No  . Sexual activity: Yes    Birth control/ protection: Surgical   Other Topics Concern  . Not on file   Social History Narrative  . No narrative on file     Family History:  The patient's family history includes Cancer in her maternal grandmother; Heart attack (age of onset: 36) in her paternal grandfather; Heart attack (age of onset: 55) in her father; Rheumatic fever  in her mother; Stroke in her mother.   ROS:   Please see the history of present illness.    ROS All other systems reviewed and are negative.   PHYSICAL EXAM:   VS:  BP 124/82   Pulse 69   Ht 5\' 2"  (1.575 m)   Wt 255 lb 6.4 oz (115.8 kg)   BMI 46.71 kg/m    GEN: Well nourished, well developed, in no acute distress  HEENT: normal  Neck: no JVD, carotid bruits, or masses Cardiac: RRR; no murmurs, rubs, or gallops,no edema , healthy subclavian pacemaker site Respiratory:  clear to auscultation bilaterally, normal work of breathing GI: soft, nontender, nondistended, + BS MS: no deformity or atrophy  Skin: warm and dry, no rash Neuro:  Alert and Oriented x 3, Strength and sensation are intact Psych: euthymic mood, full affect  Wt Readings from Last 3 Encounters:  12/11/15  255 lb 6.4 oz (115.8 kg)  09/18/15 241 lb 3.2 oz (109.4 kg)  08/20/15 243 lb 9.6 oz (110.5 kg)      Studies/Labs Reviewed:   EKG:  EKG is ordered today.  The ekg ordered today demonstrates Atypical atrial flutter with a cycle length of 250 ms and mostly ventricular paced rhythm with a couple of native AV conduction/fused beats. Even natively conducted beats have a broad QRS, most closely resembling right bundle branch block with left anterior fascicular block. QTC 533 ms.  Recent Labs: 12/26/2014: BUN 15; Creatinine, Ser 0.98; Potassium 4.0; Sodium 138 01/06/2015: Hemoglobin 10.3; Platelets 219   Lipid Panel No results found for: CHOL, TRIG, HDL, CHOLHDL, VLDL, LDLCALC, LDLDIRECT  Additional studies/ records that were reviewed today include:  Records from Dr. Johney FrameAllred , Dr. Allyson SabalBerry    ASSESSMENT:    1. Persistent atrial fibrillation (HCC)   2. Pacemaker   3. Sick sinus syndrome (HCC)   4. Essential hypertension   5. Morbid obesity (HCC)   6. Iron deficiency anemia due to chronic blood loss   7. Coagulation disorder (HCC)   8. Pre-procedure lab exam   9. Other fatigue      PLAN:  In order of  problems listed above:  1. AFib: currently In the middle of a lengthy episode of persistent atrial arrhythmia - On the EKG today probable atypical (left atrial?) flutter, on intracardiac electrograms clearly atrial fibrillation, going on for about a month now.. Dr. Jenel LucksAllred's previous evaluation and recommendations noted - did not believe repeat ablation would be a good option. She did not do well with higher doses of metoprolol in the past. Even with the addition of diltiazem rate control is suboptimal. We will schedule her for electrical cardioversion. This procedure has been fully reviewed with the patient and written informed consent has been obtained. If there is early arrhythmia recurrence, consider treatment with flecainide, Rythmol or hospitalization to start dofetilide. When choosing an antiarrhythmic medication, keep in mind that she is poorly tolerant of ventricular pacing. Avoid agents that would have a major effect on lengthening AV conduction, such as amiodarone or sotalol. LVEF was normal one year ago, 55-60%. 2. PPM: Normal device function. Continue remote monitoring every 3 months and office visits yearly. Before the onset of atrial fibrillation and she had a favorable heart rate histogram distribution, consistent with appropriate sensor settings. she also has evidence of prolonged AV conduction time but with MVP programming has minimal ventricular pacing. She seems to tolerate ventricular pacing poorly with complaints of chest discomfort. 3. SSS: He has virtually atrial standstill due to a combination of previous ablation procedures. Her heart rate histogram distribution is favorable suggesting appropriate sensor programming she also has evidence of prolonged AV conduction time but with MVP programming has minimal ventricular pacing. She seems to tolerate ventricular pacing poorly with complaints of chest discomfort. 4. HTN: Well controlled.  5. Obesity: Even in the absence of obstructive sleep  apnea her obesity increases the likelihood for recurrent atrial fibrillation. Weight loss recommended.  6. Iron deficiency anemia secondary to Menorrhagia: We discussed the fact that she will not be able to interrupt her anticoagulant for at least a month following cardioversion. I agree that she will need a more definitive solution to the current problem which might involve hysterectomy and/or endometrial ablation.  She will be scheduled for cardioversion next week with a follow-up in the A. fib clinic shortly after that. We'll go ahead and make another appointment with Dr. Johney FrameAllred  since I anticipate we will need his guidance as to the best choice for antiarrhythmic therapy.   Medication Adjustments/Labs and Tests Ordered: Current medicines are reviewed at length with the patient today.  Concerns regarding medicines are outlined above.  Medication changes, Labs and Tests ordered today are listed in the Patient Instructions below. Patient Instructions  Dr Royann Shivers has recommended that you have a Cardioversion (DCCV) next Tuesday, 11/14. Electrical Cardioversion uses a jolt of electricity to your heart either through paddles or wired patches attached to your chest. This is a controlled, usually prescheduled, procedure. Defibrillation is done under light anesthesia in the hospital, and you usually go home the day of the procedure. This is done to get your heart back into a normal rhythm. You are not awake for the procedure. Please see the instruction sheet given to you today.  You will be required to have the following tests prior to the procedure:  1. Blood work - the blood work can be done no more than 7 days prior to the procedure. It can be done at any [facility] lab. There is one downstairs on the first floor of this building and one in the [facility] building [phone]).  Your physician recommends that you schedule a follow-up appointment in 2 weeks in the Atrial  Fibrillation Clinic at [facility].  Your physician recommends that you schedule a follow-up appointment in 2 months with Dr Johney Frame.  Dr Royann Shivers recommends that you schedule a follow-up appointment in 3 months.  If you need a refill on your cardiac medications before your next appointment, please call your pharmacy.    Signed, Thurmon Fair, MD  12/11/2015 2:49 PM    [facility] [address] [place], [place], [place]  [address] Phone: [phone]; Fax: [phone]

## 2015-12-19 ENCOUNTER — Encounter (HOSPITAL_COMMUNITY): Payer: Self-pay | Admitting: Cardiovascular Disease

## 2015-12-20 ENCOUNTER — Encounter: Payer: Self-pay | Admitting: Cardiovascular Disease

## 2015-12-20 ENCOUNTER — Other Ambulatory Visit: Payer: Self-pay | Admitting: *Deleted

## 2015-12-20 MED ORDER — DILTIAZEM HCL 120 MG PO TABS: 240.0000 mg | ORAL_TABLET | Freq: Every day | ORAL | 3 refills | Status: DC

## 2015-12-24 ENCOUNTER — Ambulatory Visit (HOSPITAL_COMMUNITY)
Admission: RE | Admit: 2015-12-24 | Discharge: 2015-12-24 | Disposition: A | Discharge status: Home / Self Care | Payer: Managed Care, Other (non HMO) | Source: Ambulatory Visit | Attending: Nurse Practitioner | Admitting: Nurse Practitioner

## 2015-12-24 VITALS — BP 124/82 | HR 83 | Ht 62.0 in | Wt 255.0 lb

## 2015-12-24 DIAGNOSIS — Z950 Presence of cardiac pacemaker: Secondary | ICD-10-CM | POA: Diagnosis not present

## 2015-12-24 DIAGNOSIS — E039 Hypothyroidism, unspecified: Secondary | ICD-10-CM | POA: Insufficient documentation

## 2015-12-24 DIAGNOSIS — E669 Obesity, unspecified: Secondary | ICD-10-CM | POA: Diagnosis not present

## 2015-12-24 DIAGNOSIS — Z79899 Other long term (current) drug therapy: Secondary | ICD-10-CM | POA: Diagnosis not present

## 2015-12-24 DIAGNOSIS — I481 Persistent atrial fibrillation: Secondary | ICD-10-CM | POA: Insufficient documentation

## 2015-12-24 DIAGNOSIS — Z6841 Body Mass Index (BMI) 40.0 and over, adult: Secondary | ICD-10-CM | POA: Insufficient documentation

## 2015-12-24 DIAGNOSIS — I10 Essential (primary) hypertension: Secondary | ICD-10-CM | POA: Diagnosis not present

## 2015-12-24 DIAGNOSIS — I480 Paroxysmal atrial fibrillation: Secondary | ICD-10-CM

## 2015-12-24 DIAGNOSIS — Z7901 Long term (current) use of anticoagulants: Secondary | ICD-10-CM | POA: Insufficient documentation

## 2015-12-24 NOTE — Progress Notes (Signed)
Patient ID: Priscilla [name], female   DOB: [birth year], 50 y.o.   MRN: [id number]     Primary Care Physician: Gaye AlkenBARNES,ELIZABETH STEWART, MD Referring Physician: Dr. Allyson SabalBerry Cardiologist: Dr. Jori Mollroituru    Priscilla [name] is a 50 y.o. female with a h/o aflutter ablation and [place], 2/16, with nml LHC,  before moving to this area last year. She had an interogationof her device which showed persistent afib for the last several weeks when interrogated by Dr. Royann Shiversroitoru. She was set up for cardioversion but post procedure she showed afib. Today is in the afib clinic for one week f/u. She is in SR today by interrogation and appears to have had less than 0.2% burden since cardioversion. She feels well. She thinks she may have been slightly more fatigued, dry cough when she saw Dr. Salena Saner prior to cardioversion, which has been her afib symptoms in the past.    Today, she denies symptoms of palpitations, chest pain, shortness of breath, orthopnea, PND, lower extremity edema, dizziness, presyncope, syncope, or neurologic sequela. The patient is tolerating medications without difficulties and is otherwise without complaint today.   Past Medical History:  Diagnosis Date  . Atrial fibrillation (HCC) 2015  . Atrial flutter (HCC) 2015   status post a flutter ablation and subsequent Medtronic pacemaker, battery 2015  . History of colonoscopy 01/23/15  . History of mammogram 11/13/14  . Hypertension   . Hypothyroidism   . Obese   . Sick sinus syndrome (HCC) 08/01/2014   Dual chamber Medtronic advisa MRI conditional implanted February 2015   Past Surgical History:  Procedure Laterality Date  . ATRIAL FL[place]TER [place]BLATION  2015   [place]  . BLADDER SUSPENSION N/A 01/05/2015   Procedure: TRANSVAGINAL TAPE (TVT) PROCEDURE;  Surgeon: Osborn CohoAngela Roberts, MD;  Location: [facility];  Service: Gynecology;  Laterality: N/A;  . CARDIAC CATHETERIZATION  03/2013  . CARDIOVERSION N/A 12/18/2015   Procedure: CARDIOVERSION;   Surgeon: Thurmon FairMihai Croitoru, MD;  Location: [facility];  Service: Cardiovascular;  Laterality: N/A;  . CESAREAN SECTION     x3  . CYSTOSCOPY N/A 01/05/2015   Procedure: CYSTOSCOPY;  Surgeon: Osborn CohoAngela Roberts, MD;  Location: [facility];  Service: Gynecology;  Laterality: N/A;  . PACEMAKER INSERTION  03/2013   Medtronic Advisa DR MRI implanted in [place] for sick sinus syndrome    Current Outpatient Prescriptions  Medication Sig Dispense Refill  . diltiazem (CARDIZEM) 120 MG tablet Take 2 tablets (240 mg total) by mouth daily. 180 tablet 3  . ELIQUIS 5 MG TABS tablet TAKE ONE TABLET BY MOUTH TWICE DAILY 60 tablet 5  . Ferrous Sulfate (IRON) 325 (65 Fe) MG TABS Take 1 tablet by mouth daily.    Marland Kitchen. levothyroxine (SYNTHROID, LEVOTHROID) 25 MCG tablet Take 25 mcg by mouth daily.   0  . Melatonin 5 MG TABS Take 1 tablet by mouth at bedtime as needed (for sleep).     . metoprolol (LOPRESSOR) 50 MG tablet Take 50 mg by mouth 2 (two) times daily.    . Multiple Vitamins-Minerals (CENTRUM SILVER ADULT 50+ PO) Take 1 tablet by mouth daily.    . Omega-3 Fatty Acids (FISH OIL) 1200 MG CAPS Take 1 capsule by mouth daily.     No current facility-administered medications for this encounter.     No Known Allergies  Social History   Social History  . Marital status: Married    Spouse name: N/A  . Number of children: N/A  . Years  of education: N/A   Occupational History  . Not on file.   Social History Main Topics  . Smoking status: Never Smoker  . Smokeless tobacco: Never Used  . Alcohol use 1.2 - 2.4 oz/week    2 - 4 Glasses of wine per week  . Drug use: No  . Sexual activity: Yes    Birth control/ protection: Surgical   Other Topics Concern  . Not on file   Social History Narrative  . No narrative on file    Family History  Problem Relation Age of Onset  . Stroke Mother     x3  . Rheumatic fever Mother   . Heart attack Father [id number]  . Cancer Maternal Grandmother     breast  . Heart attack  Paternal Grandfather 50    ROS- All systems are reviewed and negative except as per the HPI above  Physical Exam: Vitals:   12/24/15 1129  BP: 124/82  Pulse: 83  Weight: 255 lb (115.7 kg)  Height: 5\' 2"  (1.575 m)    GEN- The patient is well appearing, alert and oriented x 3 today.   Head- normocephalic, atraumatic Eyes-  Sclera clear, conjunctiva pink Ears- hearing intact Oropharynx- clear Neck- supple, no JVP Lymph- no cervical lymphadenopathy Lungs- Clear to ausculation bilaterally, normal work of breathing Heart- Regular rate and rhythm, no murmurs, rubs or gallops, PMI not laterally displaced GI- soft, NT, ND, + BS Extremities- no clubbing, cyanosis, or edema MS- no significant deformity or atrophy Skin- no rash or lesion Psych- euthymic mood, full affect Neuro- strength and sensation are intact  EKG- atrial paced with prolonged AV conduction, LAFB, pr int 318 ms, QRS in 124 ms, QTc 446 ms Epic records reviewed Interrogation reveals SR with less thatn 0.2% afib burden  Assessment and Plan: 1. A fib Mildly symptomatic with fatigue and dry cough when in afib S/p cardioversion with afib noted right after cardioversion but Interrogation today  shows SR with less than 0.2% afib burden For now pf feels well and wants to avoid any antiarrythmic, if needed, Dr. Johney FrameAllred suggests Tikosyn is a viable option  2. PPM Per remote checks  4. Obesity Has regained since from 236 lbs since 03/14/15 to current 255 lbs Weight loss recommended  F/u with Dr. Johney FrameAllred as scheduled 1/12  Elvina Sidleonna C. Matthew Folksarroll, ANP-C [facility] [address] [place], [place] [address] [phone]

## 2015-12-24 NOTE — Progress Notes (Signed)
Thanks, James! Priscilla [name] 

## 2015-12-25 NOTE — Progress Notes (Signed)
Thank you :)

## 2016-01-25 ENCOUNTER — Encounter: Payer: Self-pay | Admitting: Internal Medicine

## 2016-02-08 ENCOUNTER — Encounter: Payer: Self-pay | Admitting: Internal Medicine

## 2016-02-15 ENCOUNTER — Encounter: Payer: Managed Care, Other (non HMO) | Admitting: Internal Medicine

## 2016-03-05 ENCOUNTER — Encounter: Payer: Self-pay | Admitting: *Deleted

## 2016-03-06 ENCOUNTER — Encounter: Payer: Managed Care, Other (non HMO) | Admitting: Internal Medicine

## 2016-03-10 ENCOUNTER — Ambulatory Visit (INDEPENDENT_AMBULATORY_CARE_PROVIDER_SITE_OTHER): Payer: Commercial Managed Care - PPO | Admitting: Internal Medicine

## 2016-03-10 VITALS — BP 116/70 | HR 88 | Ht 62.0 in | Wt 258.4 lb

## 2016-03-10 DIAGNOSIS — I10 Essential (primary) hypertension: Secondary | ICD-10-CM | POA: Diagnosis not present

## 2016-03-10 DIAGNOSIS — I495 Sick sinus syndrome: Secondary | ICD-10-CM

## 2016-03-10 DIAGNOSIS — I480 Paroxysmal atrial fibrillation: Secondary | ICD-10-CM

## 2016-03-10 LAB — CUP PACEART INCLINIC DEVICE CHECK
Battery Remaining Longevity: 78 mo
Battery Voltage: 3.01 V
Brady Statistic AP VP Percent: 0.8 %
Brady Statistic AP VS Percent: 98.69 %
Brady Statistic AS VP Percent: 0.04 %
Brady Statistic AS VS Percent: 0.47 %
Brady Statistic RA Percent Paced: 99.23 %
Brady Statistic RV Percent Paced: 0.79 %
Date Time Interrogation Session: 20180205164548
Implantable Lead Implant Date: 20150207
Implantable Lead Implant Date: 20150207
Implantable Lead Location: 753859
Implantable Lead Location: 753860
Implantable Lead Model: 5076
Implantable Lead Model: 5076
Implantable Pulse Generator Implant Date: 20150207
Lead Channel Impedance Value: 342 Ohm
Lead Channel Impedance Value: 361 Ohm
Lead Channel Impedance Value: 418 Ohm
Lead Channel Impedance Value: 456 Ohm
Lead Channel Pacing Threshold Amplitude: 1.125 V
Lead Channel Pacing Threshold Amplitude: 2 V
Lead Channel Pacing Threshold Pulse Width: 0.4 ms
Lead Channel Pacing Threshold Pulse Width: 0.4 ms
Lead Channel Sensing Intrinsic Amplitude: 4.5 mV
Lead Channel Sensing Intrinsic Amplitude: 4.5 mV
Lead Channel Sensing Intrinsic Amplitude: 8 mV
Lead Channel Sensing Intrinsic Amplitude: 8 mV
Lead Channel Setting Pacing Amplitude: 2.25 V
Lead Channel Setting Pacing Amplitude: 2.5 V
Lead Channel Setting Pacing Pulse Width: 0.8 ms
Lead Channel Setting Sensing Sensitivity: 0.9 mV

## 2016-03-10 NOTE — Progress Notes (Signed)
Electrophysiology Office Note   Date:  03/10/2016   ID:  Priscilla [name], DOB [birth year], MRN [id number]  PCP:  Gaye AlkenBARNES,ELIZABETH STEWART, MD  Cardiologist:  Dr Allyson SabalBerry Pacemaker followed by Dr Royann Shiversroitoru  CC: afib   History of Present Illness: Priscilla SharkGayle L Slape is a 51 y.o. female who presents today for electrophysiology evaluation.   She seems to be doing well.  She has had iron deficiency anemia with menorrhagia for which she is on iron.  She has had some dyspepsia which she attributes to iron.  She has found that it improved off of iron.  She had afib in early January for which she was completely unaware.  She is otherwise doing well. Today, she denies symptoms of palpitations, shortness of breath, orthopnea, PND, lower extremity edema, claudication, dizziness, presyncope, syncope, bleeding, or neurologic sequela. The patient is tolerating medications without difficulties and is otherwise without complaint today.    Past Medical History:  Diagnosis Date  . Atrial fibrillation (HCC) 2015  . Atrial flutter (HCC) 2015   status post a flutter ablation and subsequent Medtronic pacemaker, battery 2015  . History of colonoscopy 01/23/15  . History of mammogram 11/13/14  . Hypertension   . Hypothyroidism   . Obese   . Sick sinus syndrome (HCC) 08/01/2014   Dual chamber Medtronic advisa MRI conditional implanted February 2015   Past Surgical History:  Procedure Laterality Date  . ATRIAL FLUTTER [place]BLATION  2015   [place]  . BLADDER SUSPENSION N/A 01/05/2015   Procedure: TRANSVAGINAL TAPE (TVT) PROCEDURE;  Surgeon: Osborn CohoAngela Roberts, MD;  Location: [facility];  Service: Gynecology;  Laterality: N/A;  . CARDIAC CATHETERIZATION  03/2013  . CARDIOVERSION N/A 12/18/2015   Procedure: CARDIOVERSION;  Surgeon: Thurmon FairMihai Croitoru, MD;  Location: [facility];  Service: Cardiovascular;  Laterality: N/A;  . CESAREAN SECTION     x3  . CYSTOSCOPY N/A 01/05/2015   Procedure: CYSTOSCOPY;  Surgeon: Osborn CohoAngela Roberts, MD;   Location: [facility];  Service: Gynecology;  Laterality: N/A;  . PACEMAKER INSERTION  03/2013   Medtronic Advisa DR MRI implanted in [place] for sick sinus syndrome     Current Outpatient Prescriptions  Medication Sig Dispense Refill  . diltiazem (CARDIZEM) 120 MG tablet Take 2 tablets (240 mg total) by mouth daily. 180 tablet 3  . ELIQUIS 5 MG TABS tablet TAKE ONE TABLET BY MOUTH TWICE DAILY 60 tablet 5  . levothyroxine (SYNTHROID, LEVOTHROID) 25 MCG tablet Take 25 mcg by mouth daily.   0  . Melatonin 5 MG TABS Take 1 tablet by mouth at bedtime as needed (for sleep).     . metoprolol (LOPRESSOR) 50 MG tablet Take 50 mg by mouth 2 (two) times daily.    . Multiple Vitamins-Minerals (CENTRUM SILVER ADULT 50+ PO) Take 1 tablet by mouth daily.     No current facility-administered medications for this visit.     Allergies:   Patient has no known allergies.   Social History:  The patient  reports that she has never smoked. She has never used smokeless tobacco. She reports that she drinks about 1.2 - 2.4 oz of alcohol per week . She reports that she does not use drugs.   Family History:  The patient's family history includes Breast cancer in her maternal grandmother; Heart attack (age of onset: 5950) in her paternal grandfather; Heart attack (age of onset: 4272) in her father; Rheumatic fever in her mother; Stroke in her mother.    ROS:  Please see the  history of present illness.   All other systems are reviewed and negative.    PHYSICAL EXAM: VS:  BP 116/70   Pulse 88   Ht 5\' 2"  (1.575 m)   Wt 258 lb 6.4 oz (117.2 kg)   BMI 47.26 kg/m  , BMI Body mass index is 47.26 kg/m. GEN: overweight, in no acute distress  HEENT: normal  Neck: no JVD, carotid bruits, or masses Cardiac: RRR; no murmurs, rubs, or gallops,no edema  Respiratory:  clear to auscultation bilaterally, normal work of breathing GI: soft, nontender, nondistended, + BS MS: no deformity or atrophy  Skin: warm and dry, device  pocket is well healed Neuro:  Strength and sensation are intact Psych: euthymic mood, full affect  Device interrogation is reviewed today in detail.  See PaceArt for details.   Recent Labs: 12/11/2015: BUN 10; Creat 0.94; Hemoglobin 9.8; Platelets 345; Potassium 3.8; Sodium 141; TSH 1.70    Wt Readings from Last 3 Encounters:  03/10/16 258 lb 6.4 oz (117.2 kg)  12/24/15 255 lb (115.7 kg)  12/18/15 255 lb (115.7 kg)     Other studies Reviewed: Additional studies/ records that were reviewed today include: AF clinic notes , Dr Croitoru's notes Review of the above records today demonstrates: as above   ASSESSMENT AND PLAN:  1.  Chest pain Improved Per Dr Allyson Sabal Not related to afib  2. Afib/ atrial tachycardia Well controlled (overall burden is 0.4%).  Asymptomatic No indication for changes at this time. She is on eliquis chads2vasc score is 2 (HTN, female).  Will continue anticoagulation as per Dr Royann Shivers  If she has robust increase in atrial arrhythmias, could consider AAD therapy in the future, however at this time, I would recommend that she be followed conservatively.  I am not convinced that she has any symptoms with her afib.  She has not been compliant with lifestyle change.  Her weight continues to increase.    3. Sick sinus syndrome Normal pacemaker function though thresholds appear to be slightly elevated chronically. See Arita Miss Art report She will follow with Dr Royann Shivers going forward Atrial therapies turned on today with Minerva protocol settings  4. HTN Stable No change required today  5. Obesity Lifestyle modification including regular exercise and weight reduction was encouraged Body mass index is 47.26 kg/m.  6. Snoring Sleep study reviewed  Follow-up with Drs Allyson Sabal and Croitoru going forward.  I will see as needed  Current medicines are reviewed at length with the patient today.   The patient does not have concerns regarding her medicines.  The  following changes were made today:  none  Signed, Hillis Range, MD  03/10/2016 2:03 PM     [facility] [address] [place] [address] [phone] (office) [phone] (fax)

## 2016-03-10 NOTE — Patient Instructions (Signed)
Medication Instructions:  Your physician recommends that you continue on your current medications as directed. Please refer to the Current Medication list given to you today.   Labwork: None ordered  Testing/Procedures: None ordered  Follow-Up: Your physician recommends that you schedule a follow-up appointment in: 3 months with Dr.Croitoru   Any Other Special Instructions Will Be Listed Below (If Applicable).     If you need a refill on your cardiac medications before your next appointment, please call your pharmacy.   

## 2016-03-10 NOTE — Progress Notes (Signed)
Thanks

## 2016-03-13 ENCOUNTER — Encounter: Payer: Self-pay | Admitting: Cardiovascular Disease

## 2016-04-29 ENCOUNTER — Other Ambulatory Visit: Payer: Self-pay | Admitting: Cardiovascular Disease

## 2016-06-11 ENCOUNTER — Ambulatory Visit (INDEPENDENT_AMBULATORY_CARE_PROVIDER_SITE_OTHER): Payer: Commercial Managed Care - PPO | Admitting: Cardiovascular Disease

## 2016-06-11 ENCOUNTER — Encounter: Payer: Self-pay | Admitting: Cardiovascular Disease

## 2016-06-11 VITALS — BP 108/80 | HR 80 | Ht 62.0 in | Wt 259.0 lb

## 2016-06-11 DIAGNOSIS — I495 Sick sinus syndrome: Secondary | ICD-10-CM | POA: Diagnosis not present

## 2016-06-11 DIAGNOSIS — Z950 Presence of cardiac pacemaker: Secondary | ICD-10-CM | POA: Diagnosis not present

## 2016-06-11 DIAGNOSIS — I4819 Other persistent atrial fibrillation: Secondary | ICD-10-CM

## 2016-06-11 DIAGNOSIS — I481 Persistent atrial fibrillation: Secondary | ICD-10-CM

## 2016-06-11 DIAGNOSIS — I10 Essential (primary) hypertension: Secondary | ICD-10-CM

## 2016-06-11 DIAGNOSIS — R072 Precordial pain: Secondary | ICD-10-CM | POA: Diagnosis not present

## 2016-06-11 MED ORDER — METOPROLOL TARTRATE 50 MG PO TABS: 25.0000 mg | ORAL_TABLET | Freq: Two times a day (BID) | ORAL | 3 refills | Status: DC

## 2016-06-11 MED ORDER — DILTIAZEM HCL 120 MG PO TABS: 360.0000 mg | ORAL_TABLET | Freq: Every day | ORAL | 3 refills | Status: DC

## 2016-06-11 NOTE — Patient Instructions (Addendum)
Dr Royann Shiversroitoru has recommended making the following medication changes: 1. INCREASE Diltiazem to 360 mg daily 2. DECREASE Metoprolol to 25 mg (half tablet) twice daily  Remote monitoring is used to monitor your Pacemaker of ICD from home. This monitoring reduces the number of office visits required to check your device to one time per year. It allows us to keep an eye on the functioning of your device to ensure it is working properly. You are scheduled for a device check from home on Wednesday, August 8th, 2018. You may send your transmission at any time that day. If you have a wireless device, the transmission will be sent automatically. After your physician reviews your transmission, you will receive a postcard with your next transmission date.  Your physician recommends that you schedule a follow-up appointment with Rudi Cocoonna [place] in the A-Fib clinic FIRST AVAILABLE.  Please schedule your August follow-up with Dr Allyson SabalBerry.  Dr Royann Shiversroitoru recommends that you schedule a follow-up appointment in 6 months with a pacemaker check. You will receive a reminder letter in the mail two months in advance. If you don't receive a letter, please call our office to schedule the follow-up appointment.  If you need a refill on your cardiac medications before your next appointment, please call your pharmacy.

## 2016-06-11 NOTE — Progress Notes (Signed)
Cardiology Office Note    Date:  06/12/2016   ID:  Priscilla [name], DOB [birth year], MRN [id number]  PCP:  Juluis RainierBarnes, Elizabeth, MD  Cardiologist: Nanetta BattyJonathan Berry , M.D.; Thurmon FairMihai Marguetta Windish, MD ; Hillis RangeJames Allred, M.D.  Chief Complaint  Patient presents with  . Follow-up    chest tightness    History of Present Illness:  Priscilla [name] is a 51 y.o. female with a history of atrial flutter/fibrillation and previous radiofrequency ablation followed by atrial standstill and need for pacemaker implantation.   Over the last few months her burden of atrial fibrillation has been extremely low. She's not had persistent atrial fibrillation since the cardioversion in November 2017. Her pacemaker shows 99.6 atrial pacing. 7 episodes of atrial fibrillation have occurred since February 18, the longest being only 3 hours in duration. For the most part ventricular rates are well controlled except for one episode that appeared more organized and flutter-like, associated with RVR.  She describes episodes of "disconcerting" chest tightness. This occurs at rest radiates to her ears and makes her cough. It is not associated with the palpitations and does not coincide temporally with her atrial fibrillation. She reports having this kind of discomfort ever since her pacemaker was implanted in [place]. The discomfort is never associated with physical activity. It always happens at rest, usually in the evening. If she stands up and walks around, she usually feels better. On one occasion woke her from sleep. She has noticed that performing regular physical activity makes the chest tightness less likely to occur.  There is only 0.8% ventricular pacing. Activity level is not much changed at about 3 hours per day. Her dual chamber Medtronic advisa pacemaker implant in 2015 still has roughly 6 years of estimated longevity. She has 99.6% atrial pacing and only 0.8% ventricular pacing.  She has a history of normal coronary arteries by  previous angiography. In the past, her chest discomfort seemed to improve when beta blocker dose was reduced and her AV delay was liberalized to reduce the incidence of ventricular pacing. Echo in June 2016 showed normal left ventricular systolic function. Right heart chambers were slightly dilated, but a sleep study performed since that time has showed no evidence of sleep apnea. She has been compliant with her twice daily anticoagulant. She does not have a history of stroke/TIA or other embolic events.   Dr. Johney FrameAllred EP evaluation February 2017: "... significant atriopathy. I anticipate that she will continue to have progressive atrial arrhythmias in the future. Further ablation would carry low success and likely very little benefit. If she has robust increase in atrial arrhythmias, could consider AAD therapy in the future...."  Past Medical History:  Diagnosis Date  . Atrial fibrillation (HCC) 2015  . Atrial flutter (HCC) 2015   status post a flutter ablation and subsequent Medtronic pacemaker, battery 2015  . History of colonoscopy 01/23/15  . History of mammogram 11/13/14  . Hypertension   . Hypothyroidism   . Obese   . Sick sinus syndrome (HCC) 08/01/2014   Dual chamber Medtronic advisa MRI conditional implanted February 2015    Past Surgical History:  Procedure Laterality Date  . ATRIAL FLUTTER ABLATION  2015   in [place]  . BLADDER SUSPENSION N/A 01/05/2015   Procedure: TRANSVAGINAL TAPE (TVT) PROCEDURE;  Surgeon: Osborn CohoAngela Roberts, MD;  Location: [facility];  Service: Gynecology;  Laterality: N/A;  . CARDIAC CATHETERIZATION  03/2013  . CARDIOVERSION N/A 12/18/2015   Procedure: CARDIOVERSION;  Surgeon: Thurmon FairMihai Bill Yohn, MD;  Location: [facility];  Service: Cardiovascular;  Laterality: N/A;  . CESAREAN SECTION     x3  . CYSTOSCOPY N/A 01/05/2015   Procedure: CYSTOSCOPY;  Surgeon: Osborn Coho, MD;  Location: [facility];  Service: Gynecology;  Laterality: N/A;  . PACEMAKER INSERTION  03/2013    Medtronic Advisa DR MRI implanted in [place] for sick sinus syndrome    Current Medications: Outpatient Medications Prior to Visit  Medication Sig Dispense Refill  . ELIQUIS 5 MG TABS tablet TAKE ONE TABLET BY MOUTH TWICE DAILY 180 tablet 0  . levothyroxine (SYNTHROID, LEVOTHROID) 25 MCG tablet Take 25 mcg by mouth daily.   0  . Melatonin 5 MG TABS Take 1 tablet by mouth at bedtime as needed (for sleep).     . Multiple Vitamins-Minerals (CENTRUM SILVER ADULT 50+ PO) Take 1 tablet by mouth daily.    Marland Kitchen diltiazem (CARDIZEM) 120 MG tablet Take 2 tablets (240 mg total) by mouth daily. 180 tablet 3  . metoprolol (LOPRESSOR) 50 MG tablet Take 50 mg by mouth 2 (two) times daily.     No facility-administered medications prior to visit.      Allergies:   Patient has no known allergies.   Social History   Social History  . Marital status: Married    Spouse name: N/A  . Number of children: N/A  . Years of education: N/A   Social History Main Topics  . Smoking status: Never Smoker  . Smokeless tobacco: Never Used  . Alcohol use 1.2 - 2.4 oz/week    2 - 4 Glasses of wine per week  . Drug use: No  . Sexual activity: Yes    Birth control/ protection: Surgical   Other Topics Concern  . Not on file   Social History Narrative  . No narrative on file     Family History:  The patient's family history includes Breast cancer in her maternal grandmother; Heart attack (age of onset: 52) in her paternal grandfather; Heart attack (age of onset: 13) in her father; Rheumatic fever in her mother; Stroke in her mother.   ROS:   Please see the history of present illness.    ROS All other systems reviewed and are negative.   PHYSICAL EXAM:   VS:  BP 108/80   Pulse 80   Ht 5\' 2"  (1.575 m)   Wt 117.5 kg (259 lb)   BMI 47.37 kg/m    GEN: Well nourished, well developed, in no acute distress  HEENT: normal  Neck: no JVD, carotid bruits, or masses Cardiac: RRR; no murmurs, rubs, or gallops,no  edema , healthy subclavian pacemaker site Respiratory:  clear to auscultation bilaterally, normal work of breathing GI: soft, nontender, nondistended, + BS MS: no deformity or atrophy  Skin: warm and dry, no rash Neuro:  Alert and Oriented x 3, Strength and sensation are intact Psych: euthymic mood, full affect  Wt Readings from Last 3 Encounters:  06/11/16 117.5 kg (259 lb)  03/10/16 117.2 kg (258 lb 6.4 oz)  12/24/15 115.7 kg (255 lb)      Studies/Labs Reviewed:   EKG:  EKG is ordered today.  The ekg ordered today demonstrates Atypical atrial flutter with a cycle length of 250 ms and mostly ventricular paced rhythm with a couple of native AV conduction/fused beats. Even natively conducted beats have a broad QRS, most closely resembling right bundle branch block with left anterior fascicular block. QTC 533 ms.  Recent Labs: 12/11/2015: BUN 10; Creat 0.94; Hemoglobin 9.8;  Platelets 345; Potassium 3.8; Sodium 141; TSH 1.70   Lipid Panel No results found for: CHOL, TRIG, HDL, CHOLHDL, VLDL, LDLCALC, LDLDIRECT  Additional studies/ records that were reviewed today include:  Records from Dr. Johney Frame , Dr. Allyson Sabal    ASSESSMENT:    1. Persistent atrial fibrillation (HCC)   2. Precordial pain   3. Pacemaker   4. Sick sinus syndrome (HCC)   5. Essential hypertension   6. Morbid obesity (HCC)      PLAN:  In order of problems listed above:  1. AFib: Low burden of arrhythmia, well under 1%. Antiarrhythmic therapy is not justified at this point, but Dr Johney Frame estimated that is likely she will need this in the future since he suspects she has significant atriopathy. Compliant with anticoagulation, currently without serious bleeding problems. 2. Chest pain: Is highly atypical, is present at rest and not with activity. Activity seems to help. No significant CAD by previous angiography. She states that in the past reducing the beta blocker dose helped. It is conceivable that she could have  coronary vasospasm. Will decrease the dose of metoprolol and increase the dose of diltiazem in an effort to maintain good ventricular rate control for breakthrough episodes of atrial fibrillation. 3. PPM: Normal device function. Continue remote monitoring every 3 months and office visits yearly. With MVP she has minimal ventricular pacing. She seems to tolerate ventricular pacing poorly with complaints of chest discomfort. It's also possible that her chest discomfort is in the evening because that's when she has more V pacing as vagal tone begins dominant. 4. SSS: He has virtually atrial standstill due to a combination of previous ablation procedures.  5. HTN: Well controlled.  6. Obesity:  Again pointed out that this increases the likelihood for recurrent atrial fibrillation. Weight loss recommended.  7. Iron deficiency anemia secondary to Menorrhagia: I don't see a repeat hemoglobin since last November   Medication Adjustments/Labs and Tests Ordered: Current medicines are reviewed at length with the patient today.  Concerns regarding medicines are outlined above.  Medication changes, Labs and Tests ordered today are listed in the Patient Instructions below. Patient Instructions  Dr Royann Shivers has recommended making the following medication changes: 1. INCREASE Diltiazem to 360 mg daily 2. DECREASE Metoprolol to 25 mg (half tablet) twice daily  Remote monitoring is used to monitor your Pacemaker of ICD from home. This monitoring reduces the number of office visits required to check your device to one time per year. It allows [place] to keep an eye on the functioning of your device to ensure it is working properly. You are scheduled for a device check from home on Wednesday, August 8th, 2018. You may send your transmission at any time that day. If you have a wireless device, the transmission will be sent automatically. After your physician reviews your transmission, you will receive a postcard with your next  transmission date.  Your physician recommends that you schedule a follow-up appointment with Rudi Coco in the A-Fib clinic FIRST AVAILABLE.  Please schedule your August follow-up with Dr Allyson Sabal.  Dr Royann Shivers recommends that you schedule a follow-up appointment in 6 months with a pacemaker check. You will receive a reminder letter in the mail two months in advance. If you don't receive a letter, please call our office to schedule the follow-up appointment.  If you need a refill on your cardiac medications before your next appointment, please call your pharmacy.    Signed, Thurmon Fair, MD  06/12/2016 3:00 PM  [facility] [address], [place], [place]  [address] Phone: [phone]; Fax: [phone]

## 2016-06-12 ENCOUNTER — Encounter: Payer: Self-pay | Admitting: Cardiovascular Disease

## 2016-06-13 ENCOUNTER — Other Ambulatory Visit: Payer: Self-pay | Admitting: Cardiovascular Disease

## 2016-06-16 LAB — CUP PACEART INCLINIC DEVICE CHECK
Battery Remaining Longevity: 76 mo
Battery Voltage: 3 V
Brady Statistic AP VP Percent: 0.77 %
Brady Statistic AP VS Percent: 98.8 %
Brady Statistic AS VP Percent: 0.03 %
Brady Statistic AS VS Percent: 0.4 %
Brady Statistic RA Percent Paced: 99.35 %
Brady Statistic RV Percent Paced: 0.76 %
Date Time Interrogation Session: 20180509121708
Implantable Lead Implant Date: 20150207
Implantable Lead Implant Date: 20150207
Implantable Lead Location: 753859
Implantable Lead Location: 753860
Implantable Lead Model: 5076
Implantable Lead Model: 5076
Implantable Pulse Generator Implant Date: 20150207
Lead Channel Impedance Value: 323 Ohm
Lead Channel Impedance Value: 361 Ohm
Lead Channel Impedance Value: 418 Ohm
Lead Channel Impedance Value: 456 Ohm
Lead Channel Pacing Threshold Amplitude: 1.125 V
Lead Channel Pacing Threshold Amplitude: 2.125 V
Lead Channel Pacing Threshold Pulse Width: 0.4 ms
Lead Channel Pacing Threshold Pulse Width: 0.4 ms
Lead Channel Sensing Intrinsic Amplitude: 1.5 mV
Lead Channel Sensing Intrinsic Amplitude: 1.5 mV
Lead Channel Sensing Intrinsic Amplitude: 11.375 mV
Lead Channel Sensing Intrinsic Amplitude: 8.625 mV
Lead Channel Setting Pacing Amplitude: 2.25 V
Lead Channel Setting Pacing Amplitude: 2.5 V
Lead Channel Setting Pacing Pulse Width: 1 ms
Lead Channel Setting Sensing Sensitivity: 0.9 mV

## 2016-07-21 ENCOUNTER — Encounter (HOSPITAL_COMMUNITY): Payer: Self-pay | Admitting: Nurse Practitioner

## 2016-07-21 ENCOUNTER — Ambulatory Visit (HOSPITAL_COMMUNITY)
Admission: RE | Admit: 2016-07-21 | Discharge: 2016-07-21 | Disposition: A | Discharge status: Home / Self Care | Payer: Commercial Managed Care - PPO | Source: Ambulatory Visit | Attending: Nurse Practitioner | Admitting: Nurse Practitioner

## 2016-07-21 VITALS — BP 118/78 | HR 64 | Ht 62.0 in | Wt 263.0 lb

## 2016-07-21 DIAGNOSIS — E669 Obesity, unspecified: Secondary | ICD-10-CM | POA: Diagnosis not present

## 2016-07-21 DIAGNOSIS — R0789 Other chest pain: Secondary | ICD-10-CM | POA: Diagnosis not present

## 2016-07-21 DIAGNOSIS — I10 Essential (primary) hypertension: Secondary | ICD-10-CM | POA: Diagnosis not present

## 2016-07-21 DIAGNOSIS — I480 Paroxysmal atrial fibrillation: Secondary | ICD-10-CM | POA: Diagnosis not present

## 2016-07-21 DIAGNOSIS — E039 Hypothyroidism, unspecified: Secondary | ICD-10-CM | POA: Insufficient documentation

## 2016-07-21 DIAGNOSIS — Z950 Presence of cardiac pacemaker: Secondary | ICD-10-CM | POA: Diagnosis not present

## 2016-07-21 DIAGNOSIS — I4891 Unspecified atrial fibrillation: Secondary | ICD-10-CM | POA: Diagnosis not present

## 2016-07-21 NOTE — Progress Notes (Signed)
Patient ID: Priscilla [name], female   DOB: May 23, 1965, 51 y.o.   MRN: [id number]     Primary Care Physician: Juluis RainierBarnes, Elizabeth, MD Referring Physician: Dr. Allyson SabalBerry Cardiologist: Dr. Jori Mollroituru  Priscilla [name] is a 51 y.o. female with a h/o aflutter ablation and [place] the [place], 2/16, with nml LHC,  before moving to this area last year. I initially saw her 11/17. She had had an interogation of her device which showed persistent afib for the last several weeks when interrogated by Dr. Royann Shiversroitoru. She was set up for cardioversion but post procedure she showed afib.  She was in SR  by interrogation and appeared  to have had less than 0.2% burden since cardioversion. She feels well. She thinks she may have been slightly more fatigued, dry cough when she saw Dr. Salena Saner prior to cardioversion, which has been her afib symptoms in the past. she mostly c/o of atypical chest pain which was not felt to be related to afib. BB dose  was decreased and she had f/u with Dr. Johney FrameAllred who felt that althought her afib burden was low and she did not require antiarrythmic,she may in the future, if afib burden should increase.   F/u in the afib clinic, 6/18, she has recently seen Dr. Salena Saner, had interrogation and afib burden was very low. She was experiencing atypical chest pain again which seemed to have no correlation to afib. Her BB was decreased and cardizem was increased. She reports today that her chest pain has resolved and she is not aware of any afib..  Today, she denies symptoms of palpitations, chest pain, shortness of breath, orthopnea, PND, lower extremity edema, dizziness, presyncope, syncope, or neurologic sequela. The patient is tolerating medications without difficulties and is otherwise without complaint today.   Past Medical History:  Diagnosis Date  . Atrial fibrillation (HCC) 2015  . Atrial flutter (HCC) 2015   status post a flutter ablation and subsequent Medtronic pacemaker, battery 2015  . History of  colonoscopy 01/23/15  . History of mammogram 11/13/14  . Hypertension   . Hypothyroidism   . Obese   . Sick sinus syndrome (HCC) 08/01/2014   Dual chamber Medtronic advisa MRI conditional implanted February 2015   Past Surgical History:  Procedure Laterality Date  . ATRIAL FLUTTER [place]BLATION  2015   in [place]  . BLADDER SUSPENSION N/A 01/05/2015   Procedure: TRANSVAGINAL TAPE (TVT) PROCEDURE;  Surgeon: Osborn CohoAngela Roberts, MD;  Location: [facility];  Service: Gynecology;  Laterality: N/A;  . CARDIAC CATHETERIZATION  03/2013  . CARDIOVERSION N/A 12/18/2015   Procedure: CARDIOVERSION;  Surgeon: Thurmon FairMihai Croitoru, MD;  [facility]: [facility];  Service: Cardiovascular;  Laterality: N/A;  . CESAREAN SECTION     x3  . CYSTOSCOPY N/A 01/05/2015   Procedure: CYSTOSCOPY;  Surgeon: Osborn CohoAngela Roberts, MD;  Location: [facility];  Service: Gynecology;  Laterality: N/A;  . PACEMAKER INSERTION  03/2013   Medtronic Advisa DR MRI implanted in [place] for sick sinus syndrome    Current Outpatient Prescriptions  Medication Sig Dispense Refill  . diltiazem (CARDIZEM) 120 MG tablet Take 3 tablets (360 mg total) by mouth daily. 270 tablet 3  . ELIQUIS 5 MG TABS tablet TAKE ONE TABLET BY MOUTH TWICE DAILY 180 tablet 0  . levothyroxine (SYNTHROID, LEVOTHROID) 25 MCG tablet Take 25 mcg by mouth daily.   0  . Melatonin 5 MG TABS Take 1 tablet by mouth at bedtime as needed (for sleep).     . metoprolol (LOPRESSOR) 50  MG tablet Take 0.5 tablets (25 mg total) by mouth 2 (two) times daily. 90 tablet 3  . Multiple Vitamins-Minerals (CENTRUM SILVER ADULT 50+ PO) Take 1 tablet by mouth daily.     No current facility-administered medications for this encounter.     No Known Allergies  Social History   Social History  . Marital status: Married    Spouse name: N/A  . Number of children: N/A  . Years of education: N/A   Occupational History  . Not on file.   Social History Main Topics  . Smoking status: Never Smoker  .  Smokeless tobacco: Never Used  . Alcohol use 1.2 - 2.4 oz/week    2 - 4 Glasses of wine per week  . Drug use: No  . Sexual activity: Yes    Birth control/ protection: Surgical   Other Topics Concern  . Not on file   Social History Narrative  . No narrative on file    Family History  Problem Relation Age of Onset  . Stroke Mother        x3  . Rheumatic fever Mother   . Heart attack Father 29  . Breast cancer Maternal Grandmother   . Heart attack Paternal Grandfather 50    ROS- All systems are reviewed and negative except as per the HPI above  Physical Exam: Vitals:   07/21/16 1107  BP: 118/78  Pulse: 64  Weight: 263 lb (119.3 kg)  Height: 5\' 2"  (1.575 m)    GEN- The patient is well appearing, alert and oriented x 3 today.   Head- normocephalic, atraumatic Eyes-  Sclera clear, conjunctiva pink Ears- hearing intact Oropharynx- clear Neck- supple, no JVP Lymph- no cervical lymphadenopathy Lungs- Clear to ausculation bilaterally, normal work of breathing Heart- Regular rate and rhythm, no murmurs, rubs or gallops, PMI not laterally displaced GI- soft, NT, ND, + BS Extremities- no clubbing, cyanosis, or edema MS- no significant deformity or atrophy Skin- no rash or lesion Psych- euthymic mood, full affect Neuro- strength and sensation are intact  EKG- atrial paced with prolonged AV conduction, NSIVB, pr int 334 ms, QRS in 130 ms, QTc 408 ms Epic records reviewed Interrogation reveals SR with very low afib burden  Assessment and Plan: 1. A fib  Currently not  symptomatic with afib and with low afib burden, feel this is not the time to start AAD therapy, although at some time in the future, may require AAD therapy For now pt feels well and wants to avoid any antiarrythmic, if needed, Dr. Johney Frame suggests Tikosyn is a viable option  2. Atypical chest pain Resolved with changes in rate control  3. PPM Per remote checks  4. Obesity Has regained since from 236  lbs since 03/14/15 to current 263 lbs Weight loss recommended  F/u with Dr. Royann Shivers, Dr. Shea Evans clinic as needed  [place] [place]. [facility] [address] [place], [place] [address] [phone]

## 2016-07-29 NOTE — Addendum Note (Signed)
Encounter addended by: Newman Niparroll, Anari Evitt C, NP on: 07/29/2016  9:26 AM<BR>    Actions taken: LOS modified

## 2016-07-30 ENCOUNTER — Other Ambulatory Visit: Payer: Self-pay | Admitting: Cardiovascular Disease

## 2016-09-10 ENCOUNTER — Ambulatory Visit (INDEPENDENT_AMBULATORY_CARE_PROVIDER_SITE_OTHER): Payer: Commercial Managed Care - PPO | Admitting: *Deleted

## 2016-09-10 DIAGNOSIS — I495 Sick sinus syndrome: Secondary | ICD-10-CM | POA: Diagnosis not present

## 2016-09-10 NOTE — Progress Notes (Signed)
Remote pacemaker transmission.   

## 2016-09-11 ENCOUNTER — Encounter: Payer: Self-pay | Admitting: Cardiology

## 2016-09-23 ENCOUNTER — Ambulatory Visit (INDEPENDENT_AMBULATORY_CARE_PROVIDER_SITE_OTHER): Payer: Commercial Managed Care - PPO | Admitting: Cardiovascular Disease

## 2016-09-23 ENCOUNTER — Encounter: Payer: Self-pay | Admitting: Cardiovascular Disease

## 2016-09-23 VITALS — BP 138/90 | HR 77 | Ht 62.0 in | Wt 266.0 lb

## 2016-09-23 DIAGNOSIS — Z8679 Personal history of other diseases of the circulatory system: Secondary | ICD-10-CM

## 2016-09-23 DIAGNOSIS — I10 Essential (primary) hypertension: Secondary | ICD-10-CM

## 2016-09-23 NOTE — Patient Instructions (Signed)

## 2016-09-23 NOTE — Assessment & Plan Note (Signed)
History of essential hypertension blood pressure measures 138/90. She is on metoprolol and diltiazem. Continue current meds at current dosing

## 2016-09-23 NOTE — Progress Notes (Signed)
09/23/2016 [name]   02-20-1965  [id number]  Primary Physician Juluis Rainier, MD Primary Cardiologist: Runell Gess MD Milagros Loll, [place], [place]  HPI:  Priscilla [name] is a 51 y.o.severely overweight married Caucasian female mother of 3 children who relocated from [place] to the [place] area in December because of her husband's job with [employer]. I last saw her in the office 09/18/15. Her primary care physician is Dr. Juluis Rainier. She is referred here to be established because of prior history of atrial flutter, atrial flutter ablation, stent implantation. She has basically no chronic risk factors other than hypertension and family history with father who had a stent in his 40s and died of a myocardial infarction at age 61. She has never had a heart attack or stroke. She did have atrial flutter in February of last year patient underwent cardiac catheterization and was told she had "clean plumbing". She had atrial flutter ablation and subsequent required a Medtronic pacemaker which was followed in [place]. 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 She does complain of an occasional dry hacking cough and some chest heaviness however with recent cath that was clean I suspect this is unrelated to cardiac etiology. Her most recent pacemaker download performed by Dr. Royann Shivers 02/01/15 revealed 11 episodes of atrial tachycardia/A. Fib with rates up to 300 bpm to sustain for 1.5 hours. She saw Dr. Johney Frame who felt she was not a candidate for repeat ablation. She is on oral anticoagulation. Dr. Royann Shivers recently changed her losartan to diltiazem and has since increased her diltiazem and decreased her beta blocker. She was complaining of some palpitations which have resolved as has her atypical chest pain.   Current Meds  Medication Sig  . diltiazem (CARDIZEM) 120 MG tablet Take 3 tablets (360 mg total) by mouth daily.  Marland Kitchen ELIQUIS 5 MG TABS tablet TAKE 1 TABLET BY MOUTH TWICE DAILY  .  levothyroxine (SYNTHROID, LEVOTHROID) 25 MCG tablet Take 25 mcg by mouth daily.   . Melatonin 5 MG TABS Take 1 tablet by mouth at bedtime as needed (for sleep).   . metoprolol (LOPRESSOR) 50 MG tablet Take 0.5 tablets (25 mg total) by mouth 2 (two) times daily.  . Multiple Vitamins-Minerals (CENTRUM SILVER ADULT 50+ PO) Take 1 tablet by mouth daily.     No Known Allergies  Social History   Social History  . Marital status: Married    Spouse name: N/A  . Number of children: N/A  . Years of education: N/A   Occupational History  . Not on file.   Social History Main Topics  . Smoking status: Never Smoker  . Smokeless tobacco: Never Used  . Alcohol use 1.2 - 2.4 oz/week    2 - 4 Glasses of wine per week  . Drug use: No  . Sexual activity: Yes    Birth control/ protection: Surgical   Other Topics Concern  . Not on file   Social History Narrative  . No narrative on file     Review of Systems: General: negative for chills, fever, night sweats or weight changes.  Cardiovascular: negative for chest pain, dyspnea on exertion, edema, orthopnea, palpitations, paroxysmal nocturnal dyspnea or shortness of breath Dermatological: negative for rash Respiratory: negative for cough or wheezing Urologic: negative for hematuria Abdominal: negative for nausea, vomiting, diarrhea, bright red blood per rectum, melena, or hematemesis Neurologic: negative for visual changes, syncope, or dizziness All other systems reviewed and are otherwise negative except  as noted above.    Blood pressure 138/90, pulse 77, height 5\' 2"  (1.575 m), weight 266 lb (120.7 kg).  General appearance: alert and no distress Neck: no adenopathy, no carotid bruit, no JVD, supple, symmetrical, trachea midline and thyroid not enlarged, symmetric, no tenderness/mass/nodules Lungs: clear to auscultation bilaterally Heart: regular rate and rhythm, S1, S2 normal, no murmur, click, rub or gallop Extremities: extremities  normal, atraumatic, no cyanosis or edema  EKG atrially paced rhythm at 77 the prolonged AV conduction and left anterior fascicular block. I personally reviewed this EKG  ASSESSMENT AND PLAN:   History of atrial flutter History of atrial flutter status post cardioversion 11/17. She does have a Medtronic pacemaker was placed employed and followed by Dr. Royann Shivers. She has had runs of A. fib captured on pacemaker download and is on Eliquis oral anticoagulation. She was deemed not to be a good candidate for repeat ablation by Dr. Johney Frame.  Essential hypertension History of essential hypertension blood pressure measures 138/90. She is on metoprolol and diltiazem. Continue current meds at current dosing      Runell Gess MD [facility], [facility] 09/23/2016 3:25 PM

## 2016-09-23 NOTE — Assessment & Plan Note (Signed)
History of atrial flutter status post cardioversion 11/17. She does have a Medtronic pacemaker was placed employed and followed by Dr. Royann Shivers. She has had runs of A. fib captured on pacemaker download and is on Eliquis oral anticoagulation. She was deemed not to be a good candidate for repeat ablation by Dr. Johney Frame.

## 2016-09-29 ENCOUNTER — Encounter: Payer: Self-pay | Admitting: Cardiology

## 2016-09-30 LAB — CUP PACEART REMOTE DEVICE CHECK
Battery Remaining Longevity: 69 mo
Battery Voltage: 3 V
Brady Statistic AP VP Percent: 1.24 %
Brady Statistic AP VS Percent: 98.32 %
Brady Statistic AS VP Percent: 0.01 %
Brady Statistic AS VS Percent: 0.42 %
Brady Statistic RA Percent Paced: 99.2 %
Brady Statistic RV Percent Paced: 1.15 %
Date Time Interrogation Session: 20180808111313
Implantable Lead Implant Date: 20150207
Implantable Lead Implant Date: 20150207
Implantable Lead Location: 753859
Implantable Lead Location: 753860
Implantable Lead Model: 5076
Implantable Lead Model: 5076
Implantable Pulse Generator Implant Date: 20150207
Lead Channel Impedance Value: 323 Ohm
Lead Channel Impedance Value: 342 Ohm
Lead Channel Impedance Value: 399 Ohm
Lead Channel Impedance Value: 456 Ohm
Lead Channel Pacing Threshold Amplitude: 1.125 V
Lead Channel Pacing Threshold Amplitude: 2.125 V
Lead Channel Pacing Threshold Pulse Width: 0.4 ms
Lead Channel Pacing Threshold Pulse Width: 0.4 ms
Lead Channel Sensing Intrinsic Amplitude: 1.5 mV
Lead Channel Sensing Intrinsic Amplitude: 1.5 mV
Lead Channel Sensing Intrinsic Amplitude: 7.25 mV
Lead Channel Sensing Intrinsic Amplitude: 7.25 mV
Lead Channel Setting Pacing Amplitude: 2.5 V
Lead Channel Setting Pacing Amplitude: 2.5 V
Lead Channel Setting Pacing Pulse Width: 1 ms
Lead Channel Setting Sensing Sensitivity: 0.9 mV

## 2016-12-07 ENCOUNTER — Other Ambulatory Visit: Payer: Self-pay | Admitting: Cardiology

## 2016-12-10 ENCOUNTER — Ambulatory Visit (INDEPENDENT_AMBULATORY_CARE_PROVIDER_SITE_OTHER): Payer: Commercial Managed Care - PPO | Admitting: *Deleted

## 2016-12-10 DIAGNOSIS — I495 Sick sinus syndrome: Secondary | ICD-10-CM

## 2016-12-10 NOTE — Progress Notes (Signed)
Remote pacemaker transmission.   

## 2016-12-11 ENCOUNTER — Encounter: Payer: Self-pay | Admitting: Cardiology

## 2016-12-16 LAB — CUP PACEART REMOTE DEVICE CHECK
Battery Remaining Longevity: 69 mo
Battery Voltage: 3 V
Brady Statistic AP VP Percent: 0.95 %
Brady Statistic AP VS Percent: 98.74 %
Brady Statistic AS VP Percent: 0.02 %
Brady Statistic AS VS Percent: 0.28 %
Brady Statistic RA Percent Paced: 99.51 %
Brady Statistic RV Percent Paced: 0.9 %
Date Time Interrogation Session: 20181107131832
Implantable Lead Implant Date: 20150207
Implantable Lead Implant Date: 20150207
Implantable Lead Location: 753859
Implantable Lead Location: 753860
Implantable Lead Model: 5076
Implantable Lead Model: 5076
Implantable Pulse Generator Implant Date: 20150207
Lead Channel Impedance Value: 323 Ohm
Lead Channel Impedance Value: 361 Ohm
Lead Channel Impedance Value: 399 Ohm
Lead Channel Impedance Value: 456 Ohm
Lead Channel Pacing Threshold Amplitude: 1.125 V
Lead Channel Pacing Threshold Amplitude: 2.125 V
Lead Channel Pacing Threshold Pulse Width: 0.4 ms
Lead Channel Pacing Threshold Pulse Width: 0.4 ms
Lead Channel Sensing Intrinsic Amplitude: 1.375 mV
Lead Channel Sensing Intrinsic Amplitude: 1.375 mV
Lead Channel Sensing Intrinsic Amplitude: 8.125 mV
Lead Channel Sensing Intrinsic Amplitude: 8.125 mV
Lead Channel Setting Pacing Amplitude: 2.25 V
Lead Channel Setting Pacing Amplitude: 2.5 V
Lead Channel Setting Pacing Pulse Width: 1 ms
Lead Channel Setting Sensing Sensitivity: 0.9 mV

## 2017-01-07 ENCOUNTER — Encounter: Payer: Self-pay | Admitting: Cardiovascular Disease

## 2017-01-07 ENCOUNTER — Ambulatory Visit (INDEPENDENT_AMBULATORY_CARE_PROVIDER_SITE_OTHER): Payer: Commercial Managed Care - PPO | Admitting: Cardiovascular Disease

## 2017-01-07 VITALS — BP 126/87 | HR 84 | Ht 62.0 in | Wt 266.6 lb

## 2017-01-07 DIAGNOSIS — Z950 Presence of cardiac pacemaker: Secondary | ICD-10-CM | POA: Diagnosis not present

## 2017-01-07 DIAGNOSIS — N920 Excessive and frequent menstruation with regular cycle: Secondary | ICD-10-CM

## 2017-01-07 DIAGNOSIS — I481 Persistent atrial fibrillation: Secondary | ICD-10-CM | POA: Diagnosis not present

## 2017-01-07 DIAGNOSIS — I10 Essential (primary) hypertension: Secondary | ICD-10-CM | POA: Diagnosis not present

## 2017-01-07 DIAGNOSIS — I495 Sick sinus syndrome: Secondary | ICD-10-CM | POA: Diagnosis not present

## 2017-01-07 DIAGNOSIS — D500 Iron deficiency anemia secondary to blood loss (chronic): Secondary | ICD-10-CM | POA: Diagnosis not present

## 2017-01-07 DIAGNOSIS — I4819 Other persistent atrial fibrillation: Secondary | ICD-10-CM

## 2017-01-07 NOTE — Patient Instructions (Signed)
Dr Croitoru recommends that you continue on your current medications as directed. Please refer to the Current Medication list given to you today.  Remote monitoring is used to monitor your Pacemaker or ICD from home. This monitoring reduces the number of office visits required to check your device to one time per year. It allows us to keep an eye on the functioning of your device to ensure it is working properly. You are scheduled for a device check from home on Wednesday, February 6th, 2019. You may send your transmission at any time that day. If you have a wireless device, the transmission will be sent automatically. After your physician reviews your transmission, you will receive a notification with your next transmission date.  Dr Croitoru recommends that you schedule a follow-up appointment in 12 months with a pacemaker check. You will receive a reminder letter in the mail two months in advance. If you don't receive a letter, please call our office to schedule the follow-up appointment.  If you need a refill on your cardiac medications before your next appointment, please call your pharmacy. 

## 2017-01-07 NOTE — Progress Notes (Signed)
Cardiology Office Note    Date:  01/07/2017   ID:  Priscilla SharkGayle L Borges, DOB 05/17/1965, MRN [id number]  PCP:  Juluis RainierBarnes, Elizabeth, MD  Cardiologist: Nanetta BattyJonathan Berry , M.D.; Thurmon FairMihai Paco Cislo, MD ; Hillis RangeJames Allred, M.D.  Chief Complaint  Patient presents with  . Follow-up    History of Present Illness:  Priscilla [name] is a 51 y.o. female with a history of atrial flutter/fibrillation and previous radiofrequency ablation followed by atrial standstill and need for pacemaker implantation.    She subsequently had recurrent atrial fibrillation in 2017.  She last underwent a cardioversion in November 2017.  She had an asymptomatic episode of atrial fibrillation in January.  Since the relative burden of atrial fibrillation was low, the decision was made to manage with anticoagulation and avoid antiarrhythmics for now.  Atrial therapies were turned on February when she was last seen by Dr. Johney FrameAllred in February of this year and saw Rudi CocoDonna Carroll in the A. fib clinic in June.  Her last visit with Dr. Nanetta BattyJonathan Berry was August  Device interrogation shows that her burden of atrial fibrillation remains very low around 0.3%.  Interestingly many of the episodes occur in the evening hours.  They are generally brief lasting from several minutes to at most 4 hours.  Rate control is mediocre with average ventricular rates typically around 100-120, but the episodes are routinely asymptomatic.  Pacemaker check is otherwise normal.  Estimated generator longevity is 5.5 years.  Lead parameters are all excellent.  Activity level remains steady at around 3 hours a day.  She has 99% atrial pacing and only 1.1% ventricular pacing.  Reportedly, atrial therapies have worked on for over 11 (36.4%) of the episodes of atrial arrhythmia as far as I can tell none of these were truly "clean breaks".  She had a normal sleep study earlier this year.  She has iron deficiency anemia related to menorrhagia.  She has treated hypothyroidism.  She plans  to have blood work with Dr. Juluis RainierElizabeth Barnes in January.  She has a history of normal coronary arteries by previous angiography. In the past, her chest discomfort seemed to improve when beta blocker dose was reduced and her AV delay was liberalized to reduce the incidence of ventricular pacing. Echo in June 2016 showed normal left ventricular systolic function. Right heart chambers were slightly dilated, but a sleep study performed since that time has showed no evidence of sleep apnea.Dr. Johney FrameAllred EP evaluation February 2017: "... significant atriopathy. I anticipate that she will continue to have progressive atrial arrhythmias in the future. Further ablation would carry low success and likely very little benefit. If she has robust increase in atrial arrhythmias, could consider AAD therapy in the future...."  Past Medical History:  Diagnosis Date  . Atrial fibrillation (HCC) 2015  . Atrial flutter (HCC) 2015   status post a flutter ablation and subsequent Medtronic pacemaker, battery 2015  . History of colonoscopy 01/23/15  . History of mammogram 11/13/14  . Hypertension   . Hypothyroidism   . Obese   . Sick sinus syndrome (HCC) 08/01/2014   Dual chamber Medtronic advisa MRI conditional implanted February 2015    Past Surgical History:  Procedure Laterality Date  . ATRIAL FLUTTER ABLATION  2015   in [place]  . BLADDER SUSPENSION N/A 01/05/2015   Procedure: TRANSVAGINAL TAPE (TVT) PROCEDURE;  Surgeon: Osborn CohoAngela Roberts, MD;  Location: [facility];  Service: Gynecology;  Laterality: N/A;  . CARDIAC CATHETERIZATION  03/2013  . CARDIOVERSION N/A 12/18/2015  Procedure: CARDIOVERSION;  Surgeon: Thurmon FairMihai Marnell Mcdaniel, MD;  Location: [facility];  Service: Cardiovascular;  Laterality: N/A;  . CESAREAN SECTION     x3  . CYSTOSCOPY N/A 01/05/2015   Procedure: CYSTOSCOPY;  Surgeon: Osborn CohoAngela Roberts, MD;  Location: [facility];  Service: Gynecology;  Laterality: N/A;  . PACEMAKER INSERTION  03/2013   Medtronic Advisa DR  MRI implanted in [place] for sick sinus syndrome    Current Medications: Outpatient Medications Prior to Visit  Medication Sig Dispense Refill  . diltiazem (CARDIZEM) 120 MG tablet Take three (3) capsules (360 mg) by mouth daily. 270 tablet 2  . ELIQUIS 5 MG TABS tablet TAKE 1 TABLET BY MOUTH TWICE DAILY 180 tablet 1  . levothyroxine (SYNTHROID, LEVOTHROID) 25 MCG tablet Take 25 mcg by mouth daily.   0  . Melatonin 5 MG TABS Take 1 tablet by mouth at bedtime as needed (for sleep).     . metoprolol (LOPRESSOR) 50 MG tablet Take 0.5 tablets (25 mg total) by mouth 2 (two) times daily. 90 tablet 3  . Multiple Vitamins-Minerals (CENTRUM SILVER ADULT 50+ PO) Take 1 tablet by mouth daily.     No facility-administered medications prior to visit.      Allergies:   Patient has no known allergies.   Social History   Socioeconomic History  . Marital status: Married    Spouse name: None  . Number of children: None  . Years of education: None  . Highest education level: None  Social Needs  . Financial resource strain: None  . Food insecurity - worry: None  . Food insecurity - inability: None  . Transportation needs - medical: None  . Transportation needs - non-medical: None  Occupational History  . None  Tobacco Use  . Smoking status: Never Smoker  . Smokeless tobacco: Never Used  Substance and Sexual Activity  . Alcohol use: Yes    Alcohol/week: 1.2 - 2.4 oz    Types: 2 - 4 Glasses of wine per week  . Drug use: No  . Sexual activity: Yes    Birth control/protection: Surgical  Other Topics Concern  . None  Social History Narrative  . None     Family History:  The patient's family history includes Breast cancer in her maternal grandmother; Heart attack (age of onset: 1550) in her paternal grandfather; Heart attack (age of onset: 8172) in her father; Rheumatic fever in her mother; Stroke in her mother.   ROS:   Please see the history of present illness.    ROS All other systems  reviewed and are negative.   PHYSICAL EXAM:   VS:  BP 126/87   Pulse 84   Ht 5\' 2"  (1.575 m)   Wt 266 lb 9.6 oz (120.9 kg)   BMI 48.76 kg/m     General: Alert, oriented x3, no distress, morbidly obese Head: no evidence of trauma, PERRL, EOMI, no exophtalmos or lid lag, no myxedema, no xanthelasma; normal ears, nose and oropharynx Neck: normal jugular venous pulsations and no hepatojugular reflux; brisk carotid pulses without delay and no carotid bruits Chest: clear to auscultation, no signs of consolidation by percussion or palpation, normal fremitus, symmetrical and full respiratory excursions.  Healthy left subclavian pacemaker site Cardiovascular: normal position and quality of the apical impulse, regular rhythm, normal first and second heart sounds, no murmurs, rubs or gallops Abdomen: no tenderness or distention, no masses by palpation, no abnormal pulsatility or arterial bruits, normal bowel sounds, no hepatosplenomegaly Extremities: no clubbing, cyanosis  or edema; 2+ radial, ulnar and brachial pulses bilaterally; 2+ right femoral, posterior tibial and dorsalis pedis pulses; 2+ left femoral, posterior tibial and dorsalis pedis pulses; no subclavian or femoral bruits Neurological: grossly nonfocal Psych: Normal mood and affect   Wt Readings from Last 3 Encounters:  01/07/17 266 lb 9.6 oz (120.9 kg)  09/23/16 266 lb (120.7 kg)  07/21/16 263 lb (119.3 kg)      Studies/Labs Reviewed:   EKG:  EKG is ordered today.  Shows atrial paced rhythm with long AV delay of almost 300 ms and left bundle branch block with QRS 142 ms.  Normal QTC 439 ms  Additional studies/ records that were reviewed today include:  Records from Dr. Johney Frame , Dr. Allyson Sabal, Rudi Coco  ASSESSMENT:    1. Persistent atrial fibrillation (HCC)   2. Pacemaker   3. Sick sinus syndrome (HCC)   4. Essential hypertension   5. Morbid obesity (HCC)   6. Menorrhagia with regular cycle   7. Iron deficiency anemia due  to chronic blood loss      PLAN:  In order of problems listed above:  1. AFib: Remarkably he has only had episodes of paroxysmal atrial fibrillation following a long persistent episode a year ago.  The rhythm is asymptomatic and does not require additional specific therapy at this time.  If she has recurrent persistent atrial fibrillation, could consider treatment with flecainide, Rythmol or hospitalization to start dofetilide. When choosing an antiarrhythmic medication, keep in mind that she is poorly tolerant of ventricular pacing. Avoid agents that would have a major effect on lengthening AV conduction, such as amiodarone or sotalol. LVEF was normal one year ago, 55-60%. 2. PPM: Normal pacemaker function.  Long AV delay, but with MVP only has about 1% ventricular pacing.  She tolerates ventricular pacing poorly, manifesting as chest pain. 3. SSS: He has virtually atrial standstill due to a combination of previous ablation procedures. Her heart rate histogram distribution is favorable suggesting appropriate sensor programming. 4. HTN: Well controlled.  5. Obesity: Reminded her direct relationship between weight and A. fib burden.  Strongly recommended that she continue her efforts to lose weight even if she does not have sleep apnea . 6. Iron deficiency anemia secondary to Menorrhagia: Repeat labs planned with Dr. Zachery Dauer next month   Medication Adjustments/Labs and Tests Ordered: Current medicines are reviewed at length with the patient today.  Concerns regarding medicines are outlined above.  Medication changes, Labs and Tests ordered today are listed in the Patient Instructions below. Patient Instructions  Dr Royann Shivers recommends that you continue on your current medications as directed. Please refer to the Current Medication list given to you today.  Remote monitoring is used to monitor your Pacemaker or ICD from home. This monitoring reduces the number of office visits required to check your  device to one time per year. It allows [place] to keep an eye on the functioning of your device to ensure it is working properly. You are scheduled for a device check from home on Wednesday, February 6th, 2019. You may send your transmission at any time that day. If you have a wireless device, the transmission will be sent automatically. After your physician reviews your transmission, you will receive a notification with your next transmission date.  Dr Royann Shivers recommends that you schedule a follow-up appointment in 12 months with a pacemaker check. You will receive a reminder letter in the mail two months in advance. If you don't receive a letter, please call  our office to schedule the follow-up appointment.  If you need a refill on your cardiac medications before your next appointment, please call your pharmacy.    Signed, Thurmon Fair, MD  01/07/2017 9:48 AM    [facility] [address] [place], [place], [place]  [address] Phone: [phone]; Fax: [phone]

## 2017-01-09 ENCOUNTER — Other Ambulatory Visit: Payer: Self-pay

## 2017-01-25 ENCOUNTER — Other Ambulatory Visit: Payer: Self-pay | Admitting: Cardiovascular Disease

## 2017-01-28 LAB — CUP PACEART INCLINIC DEVICE CHECK
Battery Remaining Longevity: 69 mo
Battery Voltage: 3 V
Brady Statistic AP VP Percent: 1.05 %
Brady Statistic AP VS Percent: 98.59 %
Brady Statistic AS VP Percent: 0.03 %
Brady Statistic AS VS Percent: 0.33 %
Brady Statistic RA Percent Paced: 99.38 %
Brady Statistic RV Percent Paced: 1 %
Date Time Interrogation Session: 20181205134113
Implantable Lead Implant Date: 20150207
Implantable Lead Implant Date: 20150207
Implantable Lead Location: 753859
Implantable Lead Location: 753860
Implantable Lead Model: 5076
Implantable Lead Model: 5076
Implantable Pulse Generator Implant Date: 20150207
Lead Channel Impedance Value: 323 Ohm
Lead Channel Impedance Value: 361 Ohm
Lead Channel Impedance Value: 418 Ohm
Lead Channel Impedance Value: 456 Ohm
Lead Channel Pacing Threshold Amplitude: 1 V
Lead Channel Pacing Threshold Amplitude: 1.875 V
Lead Channel Pacing Threshold Pulse Width: 0.4 ms
Lead Channel Pacing Threshold Pulse Width: 0.4 ms
Lead Channel Sensing Intrinsic Amplitude: 10.375 mV
Lead Channel Sensing Intrinsic Amplitude: 8 mV
Lead Channel Setting Pacing Amplitude: 2.5 V
Lead Channel Setting Pacing Amplitude: 2.5 V
Lead Channel Setting Pacing Pulse Width: 1 ms
Lead Channel Setting Sensing Sensitivity: 0.9 mV

## 2017-02-26 ENCOUNTER — Telehealth: Payer: Self-pay | Admitting: Cardiovascular Disease

## 2017-02-26 NOTE — Telephone Encounter (Signed)
Returned call to patient who is requesting a co-pay card for Eliquis. Co-pay card left at front desk

## 2017-02-26 NOTE — Telephone Encounter (Signed)
New message  Pt verbalized that she is calling for the RN  Pt want another drug insurance cared for Eliquis

## 2017-03-11 ENCOUNTER — Telehealth: Payer: Self-pay | Admitting: Cardiology

## 2017-03-11 ENCOUNTER — Ambulatory Visit (INDEPENDENT_AMBULATORY_CARE_PROVIDER_SITE_OTHER): Payer: Commercial Managed Care - PPO | Admitting: *Deleted

## 2017-03-11 DIAGNOSIS — I495 Sick sinus syndrome: Secondary | ICD-10-CM

## 2017-03-11 NOTE — Telephone Encounter (Signed)
Spoke with pt and reminded pt of remote transmission that is due today. Pt verbalized understanding.   

## 2017-03-11 NOTE — Progress Notes (Signed)
Remote pacemaker transmission.   

## 2017-03-12 ENCOUNTER — Encounter: Payer: Self-pay | Admitting: Cardiology

## 2017-03-12 NOTE — Progress Notes (Signed)
Letter  

## 2017-04-02 LAB — CUP PACEART REMOTE DEVICE CHECK
Battery Remaining Longevity: 60 mo
Battery Voltage: 3 V
Brady Statistic AP VP Percent: 0.85 %
Brady Statistic AP VS Percent: 99.01 %
Brady Statistic AS VP Percent: 0.01 %
Brady Statistic AS VS Percent: 0.13 %
Brady Statistic RA Percent Paced: 99.74 %
Brady Statistic RV Percent Paced: 0.8 %
Date Time Interrogation Session: 20190206192837
Implantable Lead Implant Date: 20150207
Implantable Lead Implant Date: 20150207
Implantable Lead Location: 753859
Implantable Lead Location: 753860
Implantable Lead Model: 5076
Implantable Lead Model: 5076
Implantable Pulse Generator Implant Date: 20150207
Lead Channel Impedance Value: 323 Ohm
Lead Channel Impedance Value: 342 Ohm
Lead Channel Impedance Value: 399 Ohm
Lead Channel Impedance Value: 456 Ohm
Lead Channel Pacing Threshold Amplitude: 1.125 V
Lead Channel Pacing Threshold Amplitude: 2.25 V
Lead Channel Pacing Threshold Pulse Width: 0.4 ms
Lead Channel Pacing Threshold Pulse Width: 0.4 ms
Lead Channel Sensing Intrinsic Amplitude: 1.375 mV
Lead Channel Sensing Intrinsic Amplitude: 1.375 mV
Lead Channel Sensing Intrinsic Amplitude: 7 mV
Lead Channel Sensing Intrinsic Amplitude: 7 mV
Lead Channel Setting Pacing Amplitude: 2.5 V
Lead Channel Setting Pacing Amplitude: 2.5 V
Lead Channel Setting Pacing Pulse Width: 1 ms
Lead Channel Setting Sensing Sensitivity: 0.9 mV

## 2017-04-10 ENCOUNTER — Encounter: Payer: Self-pay | Admitting: Cardiovascular Disease

## 2017-06-10 ENCOUNTER — Ambulatory Visit (INDEPENDENT_AMBULATORY_CARE_PROVIDER_SITE_OTHER): Payer: Commercial Managed Care - PPO | Admitting: *Deleted

## 2017-06-10 DIAGNOSIS — I495 Sick sinus syndrome: Secondary | ICD-10-CM | POA: Diagnosis not present

## 2017-06-10 NOTE — Progress Notes (Signed)
Remote pacemaker transmission.   

## 2017-06-11 ENCOUNTER — Encounter: Payer: Self-pay | Admitting: Cardiology

## 2017-07-07 LAB — CUP PACEART REMOTE DEVICE CHECK
Battery Remaining Longevity: 61 mo
Battery Voltage: 3 V
Brady Statistic AP VP Percent: 0.61 %
Brady Statistic AP VS Percent: 99.28 %
Brady Statistic AS VP Percent: 0.01 %
Brady Statistic AS VS Percent: 0.1 %
Brady Statistic RA Percent Paced: 99.81 %
Brady Statistic RV Percent Paced: 0.59 %
Date Time Interrogation Session: 20190508100715
Implantable Lead Implant Date: 20150207
Implantable Lead Implant Date: 20150207
Implantable Lead Location: 753859
Implantable Lead Location: 753860
Implantable Lead Model: 5076
Implantable Lead Model: 5076
Implantable Pulse Generator Implant Date: 20150207
Lead Channel Impedance Value: 323 Ohm
Lead Channel Impedance Value: 361 Ohm
Lead Channel Impedance Value: 399 Ohm
Lead Channel Impedance Value: 475 Ohm
Lead Channel Pacing Threshold Amplitude: 1.125 V
Lead Channel Pacing Threshold Amplitude: 2.25 V
Lead Channel Pacing Threshold Pulse Width: 0.4 ms
Lead Channel Pacing Threshold Pulse Width: 0.4 ms
Lead Channel Sensing Intrinsic Amplitude: 1.125 mV
Lead Channel Sensing Intrinsic Amplitude: 1.125 mV
Lead Channel Sensing Intrinsic Amplitude: 7.625 mV
Lead Channel Sensing Intrinsic Amplitude: 7.625 mV
Lead Channel Setting Pacing Amplitude: 2.5 V
Lead Channel Setting Pacing Amplitude: 2.5 V
Lead Channel Setting Pacing Pulse Width: 1 ms
Lead Channel Setting Sensing Sensitivity: 0.9 mV

## 2017-07-26 ENCOUNTER — Other Ambulatory Visit: Payer: Self-pay | Admitting: Cardiovascular Disease

## 2017-07-27 NOTE — Telephone Encounter (Signed)
Rx request sent to pharmacy.  

## 2017-08-04 ENCOUNTER — Other Ambulatory Visit: Payer: Self-pay | Admitting: Cardiovascular Disease

## 2017-08-17 ENCOUNTER — Other Ambulatory Visit: Payer: Self-pay | Admitting: Cardiovascular Disease

## 2017-08-17 NOTE — Telephone Encounter (Signed)
Rx sent to pharmacy   

## 2017-09-09 ENCOUNTER — Ambulatory Visit (INDEPENDENT_AMBULATORY_CARE_PROVIDER_SITE_OTHER): Payer: Commercial Managed Care - PPO | Admitting: *Deleted

## 2017-09-09 DIAGNOSIS — I495 Sick sinus syndrome: Secondary | ICD-10-CM

## 2017-09-09 DIAGNOSIS — D500 Iron deficiency anemia secondary to blood loss (chronic): Secondary | ICD-10-CM

## 2017-09-10 NOTE — Progress Notes (Signed)
Remote pacemaker transmission.   

## 2017-09-29 LAB — CUP PACEART REMOTE DEVICE CHECK
Battery Remaining Longevity: 53 mo
Battery Voltage: 2.99 V
Brady Statistic AP VP Percent: 0.62 %
Brady Statistic AP VS Percent: 98.98 %
Brady Statistic AS VP Percent: 0.02 %
Brady Statistic AS VS Percent: 0.37 %
Brady Statistic RA Percent Paced: 99.41 %
Brady Statistic RV Percent Paced: 0.62 %
Date Time Interrogation Session: 20190807092525
Implantable Lead Implant Date: 20150207
Implantable Lead Implant Date: 20150207
Implantable Lead Location: 753859
Implantable Lead Location: 753860
Implantable Lead Model: 5076
Implantable Lead Model: 5076
Implantable Pulse Generator Implant Date: 20150207
Lead Channel Impedance Value: 323 Ohm
Lead Channel Impedance Value: 342 Ohm
Lead Channel Impedance Value: 399 Ohm
Lead Channel Impedance Value: 475 Ohm
Lead Channel Pacing Threshold Amplitude: 1.25 V
Lead Channel Pacing Threshold Amplitude: 2.25 V
Lead Channel Pacing Threshold Pulse Width: 0.4 ms
Lead Channel Pacing Threshold Pulse Width: 0.4 ms
Lead Channel Sensing Intrinsic Amplitude: 1.25 mV
Lead Channel Sensing Intrinsic Amplitude: 1.25 mV
Lead Channel Sensing Intrinsic Amplitude: 7.25 mV
Lead Channel Sensing Intrinsic Amplitude: 7.25 mV
Lead Channel Setting Pacing Amplitude: 2.5 V
Lead Channel Setting Pacing Amplitude: 2.5 V
Lead Channel Setting Pacing Pulse Width: 1 ms
Lead Channel Setting Sensing Sensitivity: 0.9 mV

## 2017-11-22 ENCOUNTER — Other Ambulatory Visit: Payer: Self-pay | Admitting: Cardiovascular Disease

## 2017-11-25 ENCOUNTER — Other Ambulatory Visit: Payer: Self-pay | Admitting: *Deleted

## 2017-11-25 MED ORDER — DILTIAZEM HCL 120 MG PO TABS: 360.0000 mg | ORAL_TABLET | Freq: Every day | ORAL | 0 refills | Status: DC

## 2017-12-09 ENCOUNTER — Ambulatory Visit (INDEPENDENT_AMBULATORY_CARE_PROVIDER_SITE_OTHER): Payer: Commercial Managed Care - PPO | Admitting: *Deleted

## 2017-12-09 DIAGNOSIS — I4892 Unspecified atrial flutter: Secondary | ICD-10-CM | POA: Diagnosis not present

## 2017-12-09 DIAGNOSIS — I495 Sick sinus syndrome: Secondary | ICD-10-CM

## 2017-12-09 NOTE — Progress Notes (Signed)
Remote pacemaker transmission.   

## 2017-12-10 ENCOUNTER — Encounter: Payer: Self-pay | Admitting: Cardiovascular Disease

## 2017-12-13 ENCOUNTER — Encounter: Payer: Self-pay | Admitting: Cardiology

## 2018-01-13 ENCOUNTER — Encounter: Payer: Self-pay | Admitting: Cardiovascular Disease

## 2018-01-13 ENCOUNTER — Ambulatory Visit (INDEPENDENT_AMBULATORY_CARE_PROVIDER_SITE_OTHER): Payer: Commercial Managed Care - PPO | Admitting: Cardiovascular Disease

## 2018-01-13 VITALS — BP 116/86 | HR 90 | Ht 62.0 in | Wt 271.2 lb

## 2018-01-13 DIAGNOSIS — I10 Essential (primary) hypertension: Secondary | ICD-10-CM | POA: Diagnosis not present

## 2018-01-13 DIAGNOSIS — I480 Paroxysmal atrial fibrillation: Secondary | ICD-10-CM

## 2018-01-13 DIAGNOSIS — I495 Sick sinus syndrome: Secondary | ICD-10-CM

## 2018-01-13 DIAGNOSIS — Z950 Presence of cardiac pacemaker: Secondary | ICD-10-CM | POA: Diagnosis not present

## 2018-01-13 DIAGNOSIS — Z7901 Long term (current) use of anticoagulants: Secondary | ICD-10-CM

## 2018-01-13 NOTE — Progress Notes (Signed)
Cardiology Office Note    Date:  01/13/2018   ID:  [name], DOB December 15, 1965, MRN [id number]  PCP:  Juluis Rainier, MD  Cardiologist: Nanetta Batty , M.D.; Thurmon Fair, MD ; Hillis Range, M.D.  Chief Complaint  Patient presents with  . Atrial Fibrillation  . Pacemaker Check    History of Present Illness:  Priscilla [name] is a 52 y.o. female with morbid obesity, history of atrial flutter/fibrillation and previous radiofrequency ablation followed by atrial standstill and need for pacemaker implantation.  She has had recurrent paroxysmal atrial fibrillation, asymptomatic.  Device interrogation shows that her burden of atrial fibrillation remains very low around 0.2%.  The longest episode was 6 hours long.  Rate control is good during the longer episodes, occasional fast ventricular rates during short PAT episodes..  Pacemaker check is otherwise normal.  Estimated generator longevity is 3.5 years.  Lead parameters are all excellent.  Activity level remains steady at around 2.6 hours a day.  She has 99.8% atrial pacing and only 0.7% ventricular pacing.  Atrial therapies importantly 70% of the time, but none of these are "clean breaks".  She had a normal sleep study 2018.  She has iron deficiency anemia related to menorrhagia.  She has treated hypothyroidism.   She has a history of normal coronary arteries by previous angiography. In the past, her chest discomfort seemed to improve when beta blocker dose was reduced and her AV delay was liberalized to reduce the incidence of ventricular pacing. Echo in June 2016 showed normal left ventricular systolic function. Right heart chambers were slightly dilated, but a sleep study performed since that time has showed no evidence of sleep apnea.Dr. Johney Frame EP evaluation February 2017: "... significant atriopathy. I anticipate that she will continue to have progressive atrial arrhythmias in the future. Further ablation would carry low success and  likely very little benefit. If she has robust increase in atrial arrhythmias, could consider AAD therapy in the future...."  Past Medical History:  Diagnosis Date  . Atrial fibrillation (HCC) 2015  . Atrial flutter (HCC) 2015   status post a flutter ablation and subsequent Medtronic pacemaker, battery 2015  . History of colonoscopy 01/23/15  . History of mammogram 11/13/14  . Hypertension   . Hypothyroidism   . Obese   . Sick sinus syndrome (HCC) 08/01/2014   Dual chamber Medtronic advisa MRI conditional implanted February 2015    Past Surgical History:  Procedure Laterality Date  . ATRIAL FLUTTER ABLATION  2015   in [place]  . BLADDER SUSPENSION N/A 01/05/2015   Procedure: TRANSVAGINAL TAPE (TVT) PROCEDURE;  Surgeon: Osborn Coho, MD;  Location: [facility];  Service: Gynecology;  Laterality: N/A;  . CARDIAC CATHETERIZATION  03/2013  . CARDIOVERSION N/A 12/18/2015   Procedure: CARDIOVERSION;  Surgeon: Thurmon Fair, MD;  Location: [facility];  Service: Cardiovascular;  Laterality: N/A;  . CESAREAN SECTION     x3  . CYSTOSCOPY N/A 01/05/2015   Procedure: CYSTOSCOPY;  Surgeon: Osborn Coho, MD;  Location: [facility];  Service: Gynecology;  Laterality: N/A;  . PACEMAKER INSERTION  03/2013   Medtronic Advisa DR MRI implanted in [place] for sick sinus syndrome    Current Medications: Outpatient Medications Prior to Visit  Medication Sig Dispense Refill  . diltiazem (CARDIZEM) 120 MG tablet Take 3 tablets (360 mg total) by mouth daily. KEEP OV. 270 tablet 0  . ELIQUIS 5 MG TABS tablet TAKE 1 TABLET BY MOUTH TWICE DAILY 180 tablet 1  . levothyroxine (  SYNTHROID, LEVOTHROID) 25 MCG tablet Take 25 mcg by mouth daily.   0  . Melatonin 5 MG TABS Take 1 tablet by mouth at bedtime as needed (for sleep).     . metoprolol tartrate (LOPRESSOR) 50 MG tablet TAKE 1/2 TABLET (=25 MG    TOTAL) TWO TIMES A DAY 90 tablet 1  . Multiple Vitamins-Minerals (CENTRUM SILVER ADULT 50+ PO) Take 1 tablet by mouth  daily.     No facility-administered medications prior to visit.      Allergies:   Patient has no known allergies.   Social History   Socioeconomic History  . Marital status: Married    Spouse name: Not on file  . Number of children: Not on file  . Years of education: Not on file  . Highest education level: Not on file  Occupational History  . Not on file  Social Needs  . Financial resource strain: Not on file  . Food insecurity:    Worry: Not on file    Inability: Not on file  . Transportation needs:    Medical: Not on file    Non-medical: Not on file  Tobacco Use  . Smoking status: Never Smoker  . Smokeless tobacco: Never Used  Substance and Sexual Activity  . Alcohol use: Yes    Alcohol/week: 2.0 - 4.0 standard drinks    Types: 2 - 4 Glasses of wine per week  . Drug use: No  . Sexual activity: Yes    Birth control/protection: Surgical  Lifestyle  . Physical activity:    Days per week: Not on file    Minutes per session: Not on file  . Stress: Not on file  Relationships  . Social connections:    Talks on phone: Not on file    Gets together: Not on file    Attends religious service: Not on file    Active member of club or organization: Not on file    Attends meetings of clubs or organizations: Not on file    Relationship status: Not on file  Other Topics Concern  . Not on file  Social History Narrative  . Not on file     Family History:  The patient's family history includes Breast cancer in her maternal grandmother; Heart attack (age of onset: 7650) in her paternal grandfather; Heart attack (age of onset: 4872) in her father; Rheumatic fever in her mother; Stroke in her mother.   ROS:   Please see the history of present illness.    ROS All other systems are reviewed and are negative.  PHYSICAL EXAM:   VS:  BP 116/86   Pulse 90   Ht 5\' 2"  (1.575 m)   Wt 271 lb 3.2 oz (123 kg)   BMI 49.60 kg/m      General: Alert, oriented x3, no distress, morbidly  obese.  Healthy left subclavian pacemaker site Head: no evidence of trauma, PERRL, EOMI, no exophtalmos or lid lag, no myxedema, no xanthelasma; normal ears, nose and oropharynx Neck: normal jugular venous pulsations and no hepatojugular reflux; brisk carotid pulses without delay and no carotid bruits Chest: clear to auscultation, no signs of consolidation by percussion or palpation, normal fremitus, symmetrical and full respiratory excursions Cardiovascular: normal position and quality of the apical impulse, regular rhythm, normal first and second heart sounds, no murmurs, rubs or gallops Abdomen: no tenderness or distention, no masses by palpation, no abnormal pulsatility or arterial bruits, normal bowel sounds, no hepatosplenomegaly Extremities: no clubbing, cyanosis or  edema; 2+ radial, ulnar and brachial pulses bilaterally; 2+ right femoral, posterior tibial and dorsalis pedis pulses; 2+ left femoral, posterior tibial and dorsalis pedis pulses; no subclavian or femoral bruits Neurological: grossly nonfocal Psych: Normal mood and affect    Wt Readings from Last 3 Encounters:  01/13/18 271 lb 3.2 oz (123 kg)  01/07/17 266 lb 9.6 oz (120.9 kg)  09/23/16 266 lb (120.7 kg)      Studies/Labs Reviewed:   EKG:  EKG is ordered today.  Shows atrial paced ventricular sensed rhythm with a very long AV delay 336 ms, branch block with left axis deviation (QRS 146 ms), QTC 452 ms  Additional studies/ records that were reviewed today include:    ASSESSMENT:    1. Paroxysmal atrial fibrillation (HCC)   2. SSS (sick sinus syndrome) (HCC)   3. Pacemaker   4. Essential hypertension   5. Morbid obesity (HCC)   6. Long term (current) use of anticoagulants      PLAN:  In order of problems listed above:  1. AFib: Burden of arrhythmia is very low, the episodes are brief and asymptomatic, rate control is acceptable.  No reason to initiate additional antiarrhythmic therapy at this time.  These are  likely to frequent ventricular pacing in view of the length of her AV conduction time in the presence of intraventricular conduction delay.   2. PPM: Normal pacemaker function.  Long AV delay, but with MVP < 1% ventricular pacing.  She tolerates ventricular pacing poorly, manifesting as chest pain.  Remote downloads every 3 months and yearly office visits. 3. SSS: She has atrial standstill due to previous ablation procedures.  Heart rate histogram distribution is a little blunted and remained her heart rate response sensor is a little more aggressive today (for both activity and exercise). 4. HTN: Excellent control. 5. Obesity: Reviewed the direct relationship between weight and overall burden of arrhythmia. 6. Eliquis: No overt bleeding complications but has had problems with iron deficiency anemia.  We will try to get her most recent hemoglobin level from Dr. Zachery Dauer.   Medication Adjustments/Labs and Tests Ordered: Current medicines are reviewed at length with the patient today.  Concerns regarding medicines are outlined above.  Medication changes, Labs and Tests ordered today are listed in the Patient Instructions below. Patient Instructions  Medication Instructions:  Dr Royann Shivers recommends that you continue on your current medications as directed. Please refer to the Current Medication list given to you today.  If you need a refill on your cardiac medications before your next appointment, please call your pharmacy.   Testing/Procedures: Remote monitoring is used to monitor your Pacemaker of ICD from home. This monitoring reduces the number of office visits required to check your device to one time per year. It allows [place] to keep an eye on the functioning of your device to ensure it is working properly. You are scheduled for a device check from home on Wednesday, February 5th, 2020. You may send your transmission at any time that day. If you have a wireless device, the transmission will be sent  automatically. After your physician reviews your transmission, you will receive a postcard with your next transmission date. To improve our patient care and to more adequately follow your device, [facility] has decided, as a practice, to start following each patient four times a year with your home monitor. This means that you may experience a remote appointment that is close to an in-office appointment with your physician. Your insurance will  apply at the same rate as other remote monitoring transmissions.  Follow-Up: At [facility], you and your health needs are our priority.  As part of our continuing mission to provide you with exceptional heart care, we have created designated Provider Care Teams.  These Care Teams include your primary Cardiologist (physician) and Advanced Practice Providers (APPs -  Physician Assistants and Nurse Practitioners) who all work together to provide you with the care you need, when you need it. You will need a follow up appointment in 6 months.  Please call our office 2 months in advance to schedule this appointment.  You may see Thurmon Fair, MD or one of the following Advanced Practice Providers on your designated Care Team: [place], [place] . Micah Flesher, PA-C . You will receive a reminder letter in the mail two months in advance. If you don't receive a letter, please call our office to schedule the follow-up appointment.    Signed, Thurmon Fair, MD  01/13/2018 2:58 PM    [facility] [address] [place], [place], [place]  [address] Phone: [phone]; Fax: [phone]    All other systems reviewed and are negative.   PHYSICAL EXAM:   VS:  BP 116/86   Pulse 90   Ht 5\' 2"  (1.575 m)   Wt 271 lb 3.2 oz (123 kg)   BMI 49.60 kg/m     General: Alert, oriented x3, no distress, morbidly obese Head: no evidence of trauma, PERRL, EOMI, no exophtalmos or lid lag, no myxedema, no xanthelasma; normal ears, nose and oropharynx Neck:  normal jugular venous pulsations and no hepatojugular reflux; brisk carotid pulses without delay and no carotid bruits Chest: clear to auscultation, no signs of consolidation by percussion or palpation, normal fremitus, symmetrical and full respiratory excursions.  Healthy left subclavian pacemaker site Cardiovascular: normal position and quality of the apical impulse, regular rhythm, normal first and second heart sounds, no murmurs, rubs or gallops Abdomen: no tenderness or distention, no masses by palpation, no abnormal pulsatility or arterial bruits, normal bowel sounds, no hepatosplenomegaly Extremities: no clubbing, cyanosis or edema; 2+ radial, ulnar and brachial pulses bilaterally; 2+ right femoral, posterior tibial and dorsalis pedis pulses; 2+ left femoral, posterior tibial and dorsalis pedis pulses; no subclavian or femoral bruits Neurological: grossly nonfocal Psych: Normal mood and affect   Wt Readings from Last 3 Encounters:  01/13/18 271 lb 3.2 oz (123 kg)  01/07/17 266 lb 9.6 oz (120.9 kg)  09/23/16 266 lb (120.7 kg)      Studies/Labs Reviewed:   EKG:  EKG is ordered today.  Shows atrial paced rhythm with long AV delay of almost 300 ms and left bundle branch block with QRS 142 ms.  Normal QTC 439 ms  Additional studies/ records that were reviewed today include:  Records from Dr. Johney Frame , Dr. Allyson Sabal, Rudi Coco  ASSESSMENT:    1. Paroxysmal atrial fibrillation (HCC)   2. SSS (sick sinus syndrome) (HCC)   3. Pacemaker   4. Essential hypertension   5. Morbid obesity (HCC)   6. Long term (current) use of anticoagulants      PLAN:  In order of problems listed above:  7. AFib: Remarkably he has only had episodes of paroxysmal atrial fibrillation following a long persistent episode a year ago.  The rhythm is asymptomatic and does not require additional specific therapy at this time.  If she has recurrent persistent atrial fibrillation, could consider treatment with  flecainide, Rythmol or hospitalization  to start dofetilide. When choosing an antiarrhythmic medication, keep in mind that she is poorly tolerant of ventricular pacing. Avoid agents that would have a major effect on lengthening AV conduction, such as amiodarone or sotalol. LVEF was normal one year ago, 55-60%. 8. PPM: Normal pacemaker function.  Long AV delay, but with MVP only has about 1% ventricular pacing.  She tolerates ventricular pacing poorly, manifesting as chest pain. 9. SSS: He has virtually atrial standstill due to a combination of previous ablation procedures. Her heart rate histogram distribution is favorable suggesting appropriate sensor programming. 10. HTN: Well controlled.  11. Obesity: Reminded her direct relationship between weight and A. fib burden.  Strongly recommended that she continue her efforts to lose weight even if she does not have sleep apnea . 12. Iron deficiency anemia secondary to Menorrhagia: Repeat labs planned with Dr. Zachery Dauer next month   Medication Adjustments/Labs and Tests Ordered: Current medicines are reviewed at length with the patient today.  Concerns regarding medicines are outlined above.  Medication changes, Labs and Tests ordered today are listed in the Patient Instructions below. Patient Instructions  Medication Instructions:  Dr Royann Shivers recommends that you continue on your current medications as directed. Please refer to the Current Medication list given to you today.  If you need a refill on your cardiac medications before your next appointment, please call your pharmacy.   Testing/Procedures: Remote monitoring is used to monitor your Pacemaker of ICD from home. This monitoring reduces the number of office visits required to check your device to one time per year. It allows [place] to keep an eye on the functioning of your device to ensure it is working properly. You are scheduled for a device check from home on Wednesday, February 5th, 2020. You may  send your transmission at any time that day. If you have a wireless device, the transmission will be sent automatically. After your physician reviews your transmission, you will receive a postcard with your next transmission date. To improve our patient care and to more adequately follow your device, [facility] has decided, as a practice, to start following each patient four times a year with your home monitor. This means that you may experience a remote appointment that is close to an in-office appointment with your physician. Your insurance will apply at the same rate as other remote monitoring transmissions.  Follow-Up: At [facility], you and your health needs are our priority.  As part of our continuing mission to provide you with exceptional heart care, we have created designated Provider Care Teams.  These Care Teams include your primary Cardiologist (physician) and Advanced Practice Providers (APPs -  Physician Assistants and Nurse Practitioners) who all work together to provide you with the care you need, when you need it. You will need a follow up appointment in 6 months.  Please call our office 2 months in advance to schedule this appointment.  You may see Thurmon Fair, MD or one of the following Advanced Practice Providers on your designated Care Team: [place], [place] . Micah Flesher, PA-C . You will receive a reminder letter in the mail two months in advance. If you don't receive a letter, please call our office to schedule the follow-up appointment.    Signed, Thurmon Fair, MD  01/13/2018 2:58 PM    [facility] [address] [place], [place], [place]  [address] Phone: [phone]; Fax: [phone]

## 2018-01-13 NOTE — Patient Instructions (Signed)
Medication Instructions:  Dr Royann Shiversroitoru recommends that you continue on your current medications as directed. Please refer to the Current Medication list given to you today.  If you need a refill on your cardiac medications before your next appointment, please call your pharmacy.   Testing/Procedures: Remote monitoring is used to monitor your Pacemaker of ICD from home. This monitoring reduces the number of office visits required to check your device to one time per year. It allows us to keep an eye on the functioning of your device to ensure it is working properly. You are scheduled for a device check from home on Wednesday, February 5th, 2020. You may send your transmission at any time that day. If you have a wireless device, the transmission will be sent automatically. After your physician reviews your transmission, you will receive a postcard with your next transmission date. To improve our patient care and to more adequately follow your device, [facility] has decided, as a practice, to start following each patient four times a year with your home monitor. This means that you may experience a remote appointment that is close to an in-office appointment with your physician. Your insurance will apply at the same rate as other remote monitoring transmissions.  Follow-Up: [facility], you and your health needs are our priority.  As part of our continuing mission to provide you with exceptional heart care, we have created designated Provider Care Teams.  These Care Teams include your primary Cardiologist (physician) and Advanced Practice Providers (APPs -  Physician Assistants and Nurse Practitioners) who all work together to provide you with the care you need, when you need it. You will need a follow up appointment in 6 months.  Please call our office 2 months in advance to schedule this appointment.  You may see Thurmon FairMihai Croitoru, MD or one of the following Advanced Practice Providers on your  designated Care Team: [place], [place] . Micah FlesherAngela Duke, PA-C . You will receive a reminder letter in the mail two months in advance. If you don't receive a letter, please call our office to schedule the follow-up appointment.

## 2018-01-18 ENCOUNTER — Telehealth: Payer: Self-pay | Admitting: Cardiology

## 2018-01-18 NOTE — Telephone Encounter (Signed)
Patient called and stated that she seen MD last week and there were some changes made to her device. She stated that she is experiencing chest tightness, coughing, and pressure in her ears. Pt agreed to an appt on Tuesday 01-19-18 at 3:00 PM.

## 2018-01-19 ENCOUNTER — Ambulatory Visit (INDEPENDENT_AMBULATORY_CARE_PROVIDER_SITE_OTHER): Payer: Commercial Managed Care - PPO | Admitting: *Deleted

## 2018-01-19 DIAGNOSIS — Z950 Presence of cardiac pacemaker: Secondary | ICD-10-CM

## 2018-01-19 DIAGNOSIS — I495 Sick sinus syndrome: Secondary | ICD-10-CM

## 2018-01-20 ENCOUNTER — Other Ambulatory Visit: Payer: Self-pay | Admitting: Cardiovascular Disease

## 2018-01-20 LAB — CUP PACEART INCLINIC DEVICE CHECK
Battery Remaining Longevity: 45 mo
Battery Voltage: 2.98 V
Brady Statistic AP VP Percent: 0.99 %
Brady Statistic AP VS Percent: 98.95 %
Brady Statistic AS VP Percent: 0 %
Brady Statistic AS VS Percent: 0.05 %
Brady Statistic RA Percent Paced: 99.91 %
Brady Statistic RV Percent Paced: 0.99 %
Date Time Interrogation Session: 20191217194629
Implantable Lead Implant Date: 20150207
Implantable Lead Implant Date: 20150207
Implantable Lead Location: 753859
Implantable Lead Location: 753860
Implantable Lead Model: 5076
Implantable Lead Model: 5076
Implantable Pulse Generator Implant Date: 20150207
Lead Channel Impedance Value: 323 Ohm
Lead Channel Impedance Value: 342 Ohm
Lead Channel Impedance Value: 399 Ohm
Lead Channel Impedance Value: 475 Ohm
Lead Channel Setting Pacing Amplitude: 2.5 V
Lead Channel Setting Pacing Amplitude: 2.5 V
Lead Channel Setting Pacing Pulse Width: 1 ms
Lead Channel Setting Sensing Sensitivity: 0.9 mV

## 2018-01-20 NOTE — Progress Notes (Signed)
Pacemaker check in clinic to reprogram ADL rate back to previous settings. No testing performed, lead trends stable. No mode switch or high ventricular rates noted. Vp increased to 1.0% from <0.1% at last OV with [facility] on 01/13/18. Patient symptomatic with RV pacing. Discussed with [facility] via phone--reprogrammed ADL rate back to 95bpm as per previous settings. RA/RV max impedance limit decreased to 2000ohms per protocol. Estimated longevity 3.5 years. Patient enrolled in remote follow-up. Patient education completed. [facility] on 03/10/18 and ROV with [facility] in 07/2018.

## 2018-02-09 ENCOUNTER — Other Ambulatory Visit: Payer: Self-pay | Admitting: Cardiovascular Disease

## 2018-02-12 LAB — CUP PACEART REMOTE DEVICE CHECK
Battery Remaining Longevity: 51 mo
Battery Voltage: 2.99 V
Brady Statistic AP VP Percent: 0.6 %
Brady Statistic AP VS Percent: 99.37 %
Brady Statistic AS VP Percent: 0 %
Brady Statistic AS VS Percent: 0.03 %
Brady Statistic RA Percent Paced: 99.95 %
Brady Statistic RV Percent Paced: 0.59 %
Date Time Interrogation Session: 20191106112102
Implantable Lead Implant Date: 20150207
Implantable Lead Implant Date: 20150207
Implantable Lead Location: 753859
Implantable Lead Location: 753860
Implantable Lead Model: 5076
Implantable Lead Model: 5076
Implantable Pulse Generator Implant Date: 20150207
Lead Channel Impedance Value: 323 Ohm
Lead Channel Impedance Value: 342 Ohm
Lead Channel Impedance Value: 399 Ohm
Lead Channel Impedance Value: 475 Ohm
Lead Channel Pacing Threshold Amplitude: 1.25 V
Lead Channel Pacing Threshold Amplitude: 2.25 V
Lead Channel Pacing Threshold Pulse Width: 0.4 ms
Lead Channel Pacing Threshold Pulse Width: 0.4 ms
Lead Channel Sensing Intrinsic Amplitude: 2.25 mV
Lead Channel Sensing Intrinsic Amplitude: 2.25 mV
Lead Channel Sensing Intrinsic Amplitude: 7.125 mV
Lead Channel Sensing Intrinsic Amplitude: 7.125 mV
Lead Channel Setting Pacing Amplitude: 2.5 V
Lead Channel Setting Pacing Amplitude: 2.5 V
Lead Channel Setting Pacing Pulse Width: 1 ms
Lead Channel Setting Sensing Sensitivity: 0.9 mV

## 2018-02-23 LAB — CUP PACEART INCLINIC DEVICE CHECK
Date Time Interrogation Session: 20200121151648
Implantable Lead Implant Date: 20150207
Implantable Lead Implant Date: 20150207
Implantable Lead Location: 753859
Implantable Lead Location: 753860
Implantable Lead Model: 5076
Implantable Lead Model: 5076
Implantable Pulse Generator Implant Date: 20150207

## 2018-02-24 ENCOUNTER — Telehealth: Payer: Self-pay

## 2018-02-24 NOTE — Telephone Encounter (Signed)
-----   Message from Thurmon Fair, MD sent at 01/13/2018  2:57 PM EST ----- Please get me last labs (especially hemoglobin) from Dr. Zachery Dauer. MCr

## 2018-02-24 NOTE — Telephone Encounter (Signed)
Labs in [facility] from 03/26/17. Called Dr Zachery Dauer office to confirm - they are in fact the last labs drawn. Labs placed in Dr [employer] box for him to review.

## 2018-02-25 NOTE — Telephone Encounter (Signed)
Labs reviewed.

## 2018-03-05 ENCOUNTER — Encounter: Payer: Self-pay | Admitting: Cardiovascular Disease

## 2018-03-08 ENCOUNTER — Telehealth: Payer: Self-pay

## 2018-03-08 NOTE — Telephone Encounter (Signed)
Left message for patient stating I could mail her the letter or she could pick it up.

## 2018-03-10 ENCOUNTER — Ambulatory Visit (INDEPENDENT_AMBULATORY_CARE_PROVIDER_SITE_OTHER): Payer: Commercial Managed Care - PPO

## 2018-03-10 DIAGNOSIS — I4892 Unspecified atrial flutter: Secondary | ICD-10-CM

## 2018-03-12 LAB — CUP PACEART REMOTE DEVICE CHECK
Battery Remaining Longevity: 45 mo
Battery Voltage: 2.98 V
Brady Statistic AP VP Percent: 0.78 %
Brady Statistic AP VS Percent: 98.95 %
Brady Statistic AS VP Percent: 0.03 %
Brady Statistic AS VS Percent: 0.24 %
Brady Statistic RA Percent Paced: 99.58 %
Brady Statistic RV Percent Paced: 0.78 %
Date Time Interrogation Session: 20200205122708
Implantable Lead Implant Date: 20150207
Implantable Lead Implant Date: 20150207
Implantable Lead Location: 753859
Implantable Lead Location: 753860
Implantable Lead Model: 5076
Implantable Lead Model: 5076
Implantable Pulse Generator Implant Date: 20150207
Lead Channel Impedance Value: 323 Ohm
Lead Channel Impedance Value: 342 Ohm
Lead Channel Impedance Value: 399 Ohm
Lead Channel Impedance Value: 475 Ohm
Lead Channel Pacing Threshold Amplitude: 1.125 V
Lead Channel Pacing Threshold Amplitude: 2.125 V
Lead Channel Pacing Threshold Pulse Width: 0.4 ms
Lead Channel Pacing Threshold Pulse Width: 0.4 ms
Lead Channel Sensing Intrinsic Amplitude: 1.5 mV
Lead Channel Sensing Intrinsic Amplitude: 1.5 mV
Lead Channel Sensing Intrinsic Amplitude: 6.875 mV
Lead Channel Sensing Intrinsic Amplitude: 6.875 mV
Lead Channel Setting Pacing Amplitude: 2.5 V
Lead Channel Setting Pacing Amplitude: 2.5 V
Lead Channel Setting Pacing Pulse Width: 1 ms
Lead Channel Setting Sensing Sensitivity: 0.9 mV

## 2018-03-19 NOTE — Progress Notes (Signed)
Remote pacemaker transmission.   

## 2018-04-06 DIAGNOSIS — E039 Hypothyroidism, unspecified: Secondary | ICD-10-CM | POA: Diagnosis not present

## 2018-04-06 DIAGNOSIS — I10 Essential (primary) hypertension: Secondary | ICD-10-CM | POA: Diagnosis not present

## 2018-04-06 DIAGNOSIS — Z0000 Encounter for general adult medical examination without abnormal findings: Secondary | ICD-10-CM | POA: Diagnosis not present

## 2018-04-06 DIAGNOSIS — Z7901 Long term (current) use of anticoagulants: Secondary | ICD-10-CM | POA: Diagnosis not present

## 2018-06-16 ENCOUNTER — Other Ambulatory Visit: Payer: Self-pay

## 2018-06-16 ENCOUNTER — Ambulatory Visit (INDEPENDENT_AMBULATORY_CARE_PROVIDER_SITE_OTHER): Payer: Commercial Managed Care - PPO | Admitting: *Deleted

## 2018-06-16 DIAGNOSIS — I480 Paroxysmal atrial fibrillation: Secondary | ICD-10-CM

## 2018-06-16 DIAGNOSIS — I495 Sick sinus syndrome: Secondary | ICD-10-CM

## 2018-06-16 LAB — CUP PACEART REMOTE DEVICE CHECK
Battery Remaining Longevity: 45 mo
Battery Voltage: 2.98 V
Brady Statistic AP VP Percent: 0.58 %
Brady Statistic AP VS Percent: 99.2 %
Brady Statistic AS VP Percent: 0.01 %
Brady Statistic AS VS Percent: 0.21 %
Brady Statistic RA Percent Paced: 99.66 %
Brady Statistic RV Percent Paced: 0.56 %
Date Time Interrogation Session: 20200513130114
Implantable Lead Implant Date: 20150207
Implantable Lead Implant Date: 20150207
Implantable Lead Location: 753859
Implantable Lead Location: 753860
Implantable Lead Model: 5076
Implantable Lead Model: 5076
Implantable Pulse Generator Implant Date: 20150207
Lead Channel Impedance Value: 323 Ohm
Lead Channel Impedance Value: 342 Ohm
Lead Channel Impedance Value: 399 Ohm
Lead Channel Impedance Value: 475 Ohm
Lead Channel Pacing Threshold Amplitude: 1.125 V
Lead Channel Pacing Threshold Amplitude: 2.25 V
Lead Channel Pacing Threshold Pulse Width: 0.4 ms
Lead Channel Pacing Threshold Pulse Width: 0.4 ms
Lead Channel Sensing Intrinsic Amplitude: 2.375 mV
Lead Channel Sensing Intrinsic Amplitude: 2.375 mV
Lead Channel Sensing Intrinsic Amplitude: 7 mV
Lead Channel Sensing Intrinsic Amplitude: 7 mV
Lead Channel Setting Pacing Amplitude: 2.5 V
Lead Channel Setting Pacing Amplitude: 2.5 V
Lead Channel Setting Pacing Pulse Width: 1 ms
Lead Channel Setting Sensing Sensitivity: 0.9 mV

## 2018-07-06 NOTE — Progress Notes (Signed)
Remote pacemaker transmission.   

## 2018-07-07 ENCOUNTER — Telehealth: Payer: Self-pay | Admitting: Cardiovascular Disease

## 2018-07-07 NOTE — Telephone Encounter (Signed)
Phone visit, call [phone]/my chart/pre reg complete/consent obtained -- ttf

## 2018-07-13 NOTE — Telephone Encounter (Signed)

## 2018-07-14 ENCOUNTER — Telehealth (INDEPENDENT_AMBULATORY_CARE_PROVIDER_SITE_OTHER): Payer: Commercial Managed Care - PPO | Admitting: Cardiovascular Disease

## 2018-07-14 ENCOUNTER — Encounter: Payer: Self-pay | Admitting: Cardiovascular Disease

## 2018-07-14 DIAGNOSIS — I480 Paroxysmal atrial fibrillation: Secondary | ICD-10-CM | POA: Diagnosis not present

## 2018-07-14 DIAGNOSIS — Z7901 Long term (current) use of anticoagulants: Secondary | ICD-10-CM | POA: Diagnosis not present

## 2018-07-14 DIAGNOSIS — I10 Essential (primary) hypertension: Secondary | ICD-10-CM | POA: Diagnosis not present

## 2018-07-14 DIAGNOSIS — I495 Sick sinus syndrome: Secondary | ICD-10-CM

## 2018-07-14 NOTE — Progress Notes (Signed)
Virtual Visit via Telephone Note   This visit type was conducted due to national recommendations for restrictions regarding the COVID-19 Pandemic (e.g. social distancing) in an effort to limit this patient's exposure and mitigate transmission in our community.  Due to her co-morbid illnesses, this patient is at least at moderate risk for complications without adequate follow up.  This format is felt to be most appropriate for this patient at this time.  The patient did not have access to video technology/had technical difficulties with video requiring transitioning to audio format only (telephone).  All issues noted in this document were discussed and addressed.  No physical exam could be performed with this format.  Please refer to the patient's chart for her  consent to telehealth for [facility].   Date:  07/14/2018   ID:  Priscilla SharkGayle L Mcphie, DOB May 28, 1965, MRN [id number]  Patient Location: Home Provider Location: Home  PCP:  Juluis RainierBarnes, Elizabeth, MD  Cardiologist:  Thurmon FairMihai Desmen Schoffstall, MD  Electrophysiologist:  None   Evaluation Performed:  Follow-Up Visit  Chief Complaint: Pacemaker follow-up, atrial flutter  History of Present Illness:    Priscilla [name] is a 53 y.o. female with a history of atrial flutter/atrial fibrillation with previous radiofrequency ablation followed by atrial standstill and pacemaker implantation.  She has longstanding problems with morbid obesity and her current weight actually places her in the super obese category.  She expresses interest in bariatric surgery.  As before, she has infrequent paroxysmal atrial fibrillation and atrial flutter which is asymptomatic.  She had a lot of atrial flutter on March 23 when her husband presented with coronary disease.  On that day she mostly had atrial flutter with 2: 1 AV conduction and a ventricular rate of 158 bpm.  Overall the burden of arrhythmia is only 0.2%.  She has 99.2% atrial pacing and only 0.6% ventricular pacing.   Estimated generator longevity is 3.5 years.  The patient specifically denies any chest pain at rest exertion, dyspnea at rest or with exertion, orthopnea, paroxysmal nocturnal dyspnea, syncope, palpitations, focal neurological deficits, intermittent claudication, lower extremity edema,  cough, hemoptysis or wheezing.  She continues to gain weight and thinks that going through menopause is contributing to her difficulty in losing weight.  She expresses clear interest in bariatric surgery.  She has been trying the best she can to lose weight with improved diet and limited exercise.  She had a normal sleep study 2018.  She has iron deficiency anemia related to menorrhagia.  She has treated hypothyroidism.   She has a history of normal coronary arteries by previous angiography. In the past, her chest discomfort seemed to improve when beta blocker dose was reduced and her AV delay was liberalized to reduce the incidence of ventricular pacing. Echo in June 2016 showed normal left ventricular systolic function. Right heart chambers were slightly dilated, but a sleep study performed since that time has showed no evidence of sleep apnea.Dr. Johney FrameAllred EP evaluation February 2017: "... significant atriopathy. I anticipate that she will continue to have progressive atrial arrhythmias in the future. Further ablation would carry low success and likely very little benefit. If she has robust increase in atrial arrhythmias, could consider AAD therapy in the future...."  The patient does not have symptoms concerning for COVID-19 infection (fever, chills, cough, or new shortness of breath).    Past Medical History:  Diagnosis Date  . Atrial fibrillation (HCC) 2015  . Atrial flutter (HCC) 2015   status post a flutter ablation  and subsequent Medtronic pacemaker, battery 2015  . History of colonoscopy 01/23/15  . History of mammogram 11/13/14  . Hypertension   . Hypothyroidism   . Obese   . Sick sinus syndrome  (HCC) 08/01/2014   Dual chamber Medtronic advisa MRI conditional implanted February 2015   Past Surgical History:  Procedure Laterality Date  . ATR[place] FL[place]TER [place]BLATION  2015   [place]  . BLADDER SUSPENSION N/A 01/05/2015   Procedure: TRANSVAGINAL TAPE (TVT) PROCEDURE;  Surgeon: Osborn CohoAngela Roberts, MD;  Location: [facility];  Service: Gynecology;  Laterality: N/A;  . CARDIAC CATHETERIZATION  03/2013  . CARDIOVERSION N/A 12/18/2015   Procedure: CARDIOVERSION;  Surgeon: Thurmon FairMihai Jacinto Keil, MD;  [facility];  Service: Cardiovascular;  Laterality: N/A;  . CESAREAN SECTION     x3  . CYSTOSCOPY N/A 01/05/2015   Procedure: CYSTOSCOPY;  Surgeon: Osborn CohoAngela Roberts, MD;  Location: [facility];  Service: Gynecology;  Laterality: N/A;  . PACEMAKER INSERTION  03/2013   Medtronic Advisa DR MRI implanted in [place] for sick sinus syndrome     Current Meds  Medication Sig  . diltiazem (CARDIZEM) 120 MG tablet TAKE 3 TABLETS (360MG       TOTAL)  DAILY. KEEP OFFICE VISIT.  Marland Kitchen. ELIQUIS 5 MG TABS tablet TAKE 1 TABLET BY MOUTH TWICE DAILY  . levothyroxine (SYNTHROID, LEVOTHROID) 25 MCG tablet Take 25 mcg by mouth daily.   . Melatonin 5 MG TABS Take 1 tablet by mouth at bedtime as needed (for sleep).   . metoprolol tartrate (LOPRESSOR) 50 MG tablet TAKE 1/2 TABLET (=25 MG    TOTAL) TWO TIMES A DAY  . Multiple Vitamins-Minerals (CENTRUM SILVER ADULT 50+ PO) Take 1 tablet by mouth daily.     Allergies:   Patient has no known allergies.   Social History   Tobacco Use  . Smoking status: Never Smoker  . Smokeless tobacco: Never Used  Substance Use Topics  . Alcohol use: Yes    Alcohol/week: 2.0 - 4.0 standard drinks    Types: 2 - 4 Glasses of wine per week  . Drug use: No     Family Hx: The patient's family history includes Breast cancer in her maternal grandmother; Heart attack (age of onset: 7450) in her paternal grandfather; Heart attack (age of onset: 6972) in her father; Rheumatic fever in her mother; Stroke in  her mother.  ROS:   Please see the history of present illness.     All other systems reviewed and are negative.   Prior CV studies:   The following studies were reviewed today:  Comprehensive recent pacemaker download May 13  Labs/Other Tests and Data Reviewed:    EKG:  An ECG dated 01/13/2018 was personally reviewed today and demonstrated:  Atrial paced ventricular sensed rhythm with a very long AV delay 336 ms  Recent Labs: No results found for requested labs within last 8760 hours.   Recent Lipid Panel No results found for: CHOL, TRIG, HDL, CHOLHDL, LDLCALC, LDLDIRECT  Wt Readings from Last 3 Encounters:  07/14/18 275 lb (124.7 kg)  01/13/18 271 lb 3.2 oz (123 kg)  01/07/17 266 lb 9.6 oz (120.9 kg)     Objective:    Vital Signs:  BP 104/72   Pulse 82   Ht 5\' 2"  (1.575 m)   Wt 275 lb (124.7 kg)   BMI 50.30 kg/m    VITAL SIGNS:  reviewed Unable to examine  ASSESSMENT & PLAN:    1. AFib/AFlutter: Thankfully the burden of arrhythmia  is very low, she did have some rapid rates in the setting of coherent flutter-like rhythm with 2: 1 AV conduction, but this remained asymptomatic.  She is on appropriate anticoagulation. 2. SSS: "Atrially pacemaker dependent", atrial standstill.  Heart rate histogram distribution is a little blunted, but it seems to parallel her relatively sedentary lifestyle. 3. PM: Normal device function.  Long AV conduction time with very little ventricular pacing.  She tolerates ventricular pacing very poorly, manifesting as chest pain.  This became apparent when we try to increase her beta-blocker or make her rate response sensor more aggressive. 4. Anticoagulation: No bleeding problems, falls or injuries. 5. Super obesity: I think she is a good candidate for bariatric surgery.  The odds of maintaining sinus rhythm will be much higher if we cannot decrease her weight.  She will seek referral to her primary care provider, but I would be glad to assist if  necessary. 6. HTN: Well-controlled  COVID-19 Education: The signs and symptoms of COVID-19 were discussed with the patient and how to seek care for testing (follow up with PCP or arrange E-visit).  The importance of social distancing was discussed today.  Time:   Today, I have spent 14 minutes with the patient with telehealth technology discussing the above problems.     Medication Adjustments/Labs and Tests Ordered: Current medicines are reviewed at length with the patient today.  Concerns regarding medicines are outlined above.   Tests Ordered: No orders of the defined types were placed in this encounter.   Medication Changes: No orders of the defined types were placed in this encounter.  Patient Instructions  Medication Instructions:  Your physician recommends that you continue on your current medications as directed. Please refer to the Current Medication list given to you today.  If you need a refill on your cardiac medications before your next appointment, please call your pharmacy.   Lab work: None ordered  Testing/Procedures: None ordered  Follow-Up: At [facility], you and your health needs are our priority.  As part of our continuing mission to provide you with exceptional heart care, we have created designated Provider Care Teams.  These Care Teams include your primary Cardiologist (physician) and Advanced Practice Providers (APPs -  Physician Assistants and Nurse Practitioners) who all work together to provide you with the care you need, when you need it. You will need a follow up appointment in 12 months.  Please call our office 2 months in advance to schedule this appointment.  You may see Sanda Klein, MD or one of the following Advanced Practice Providers on your designated Care Team: Almyra Deforest, PA-C Fabian Sharp, [place]         Disposition:  Follow up 12 months  Signed, Sanda Klein, MD  07/14/2018 9:19 AM    [place]

## 2018-07-14 NOTE — Patient Instructions (Signed)

## 2018-07-15 NOTE — Progress Notes (Signed)
Sorry, fixed it, too

## 2018-08-01 ENCOUNTER — Other Ambulatory Visit: Payer: Self-pay | Admitting: Cardiovascular Disease

## 2018-08-02 NOTE — Telephone Encounter (Signed)
Rx(s) sent to pharmacy electronically.  

## 2018-08-03 NOTE — Telephone Encounter (Signed)
Opened in error

## 2018-09-16 ENCOUNTER — Ambulatory Visit (INDEPENDENT_AMBULATORY_CARE_PROVIDER_SITE_OTHER): Payer: Commercial Managed Care - PPO | Admitting: *Deleted

## 2018-09-16 DIAGNOSIS — I495 Sick sinus syndrome: Secondary | ICD-10-CM | POA: Diagnosis not present

## 2018-09-16 LAB — CUP PACEART REMOTE DEVICE CHECK
Battery Remaining Longevity: 43 mo
Battery Voltage: 2.98 V
Brady Statistic AP VP Percent: 0.51 %
Brady Statistic AP VS Percent: 99.28 %
Brady Statistic AS VP Percent: 0.02 %
Brady Statistic AS VS Percent: 0.18 %
Brady Statistic RA Percent Paced: 99.7 %
Brady Statistic RV Percent Paced: 0.51 %
Date Time Interrogation Session: 20200813094747
Implantable Lead Implant Date: 20150207
Implantable Lead Implant Date: 20150207
Implantable Lead Location: 753859
Implantable Lead Location: 753860
Implantable Lead Model: 5076
Implantable Lead Model: 5076
Implantable Pulse Generator Implant Date: 20150207
Lead Channel Impedance Value: 342 Ohm
Lead Channel Impedance Value: 342 Ohm
Lead Channel Impedance Value: 418 Ohm
Lead Channel Impedance Value: 475 Ohm
Lead Channel Pacing Threshold Amplitude: 1.125 V
Lead Channel Pacing Threshold Amplitude: 2.25 V
Lead Channel Pacing Threshold Pulse Width: 0.4 ms
Lead Channel Pacing Threshold Pulse Width: 0.4 ms
Lead Channel Sensing Intrinsic Amplitude: 2.125 mV
Lead Channel Sensing Intrinsic Amplitude: 7 mV
Lead Channel Setting Pacing Amplitude: 2.5 V
Lead Channel Setting Pacing Amplitude: 2.5 V
Lead Channel Setting Pacing Pulse Width: 1 ms
Lead Channel Setting Sensing Sensitivity: 0.9 mV

## 2018-09-27 ENCOUNTER — Encounter: Payer: Self-pay | Admitting: Cardiology

## 2018-09-27 NOTE — Progress Notes (Signed)
Remote pacemaker transmission.   

## 2018-10-31 ENCOUNTER — Other Ambulatory Visit: Payer: Self-pay | Admitting: Cardiovascular Disease

## 2018-12-16 ENCOUNTER — Ambulatory Visit (INDEPENDENT_AMBULATORY_CARE_PROVIDER_SITE_OTHER): Payer: Commercial Managed Care - PPO | Admitting: *Deleted

## 2018-12-16 DIAGNOSIS — I480 Paroxysmal atrial fibrillation: Secondary | ICD-10-CM | POA: Diagnosis not present

## 2018-12-16 DIAGNOSIS — I495 Sick sinus syndrome: Secondary | ICD-10-CM

## 2018-12-16 LAB — CUP PACEART REMOTE DEVICE CHECK
Battery Remaining Longevity: 38 mo
Battery Voltage: 2.97 V
Brady Statistic AP VP Percent: 0.54 %
Brady Statistic AP VS Percent: 99.28 %
Brady Statistic AS VP Percent: 0.01 %
Brady Statistic AS VS Percent: 0.17 %
Brady Statistic RA Percent Paced: 99.72 %
Brady Statistic RV Percent Paced: 0.52 %
Date Time Interrogation Session: 20201112124631
Implantable Lead Implant Date: 20150207
Implantable Lead Implant Date: 20150207
Implantable Lead Location: 753859
Implantable Lead Location: 753860
Implantable Lead Model: 5076
Implantable Lead Model: 5076
Implantable Pulse Generator Implant Date: 20150207
Lead Channel Impedance Value: 342 Ohm
Lead Channel Impedance Value: 361 Ohm
Lead Channel Impedance Value: 399 Ohm
Lead Channel Impedance Value: 475 Ohm
Lead Channel Pacing Threshold Amplitude: 1.125 V
Lead Channel Pacing Threshold Amplitude: 2.5 V
Lead Channel Pacing Threshold Pulse Width: 0.4 ms
Lead Channel Pacing Threshold Pulse Width: 0.4 ms
Lead Channel Sensing Intrinsic Amplitude: 1.375 mV
Lead Channel Sensing Intrinsic Amplitude: 1.375 mV
Lead Channel Sensing Intrinsic Amplitude: 6.875 mV
Lead Channel Sensing Intrinsic Amplitude: 6.875 mV
Lead Channel Setting Pacing Amplitude: 2.25 V
Lead Channel Setting Pacing Amplitude: 2.5 V
Lead Channel Setting Pacing Pulse Width: 1 ms
Lead Channel Setting Sensing Sensitivity: 0.9 mV

## 2019-01-07 NOTE — Progress Notes (Signed)
Remote pacemaker transmission.   

## 2019-01-19 ENCOUNTER — Other Ambulatory Visit: Payer: Self-pay | Admitting: Cardiovascular Disease

## 2019-03-17 ENCOUNTER — Ambulatory Visit (INDEPENDENT_AMBULATORY_CARE_PROVIDER_SITE_OTHER): Payer: Commercial Managed Care - PPO | Admitting: *Deleted

## 2019-03-17 DIAGNOSIS — I480 Paroxysmal atrial fibrillation: Secondary | ICD-10-CM | POA: Diagnosis not present

## 2019-03-17 LAB — CUP PACEART REMOTE DEVICE CHECK
Battery Remaining Longevity: 40 mo
Battery Voltage: 2.96 V
Brady Statistic AP VP Percent: 0.52 %
Brady Statistic AP VS Percent: 99.44 %
Brady Statistic AS VP Percent: 0 %
Brady Statistic AS VS Percent: 0.04 %
Brady Statistic RA Percent Paced: 99.92 %
Brady Statistic RV Percent Paced: 0.48 %
Date Time Interrogation Session: 20210211073229
Implantable Lead Implant Date: 20150207
Implantable Lead Implant Date: 20150207
Implantable Lead Location: 753859
Implantable Lead Location: 753860
Implantable Lead Model: 5076
Implantable Lead Model: 5076
Implantable Pulse Generator Implant Date: 20150207
Lead Channel Impedance Value: 342 Ohm
Lead Channel Impedance Value: 342 Ohm
Lead Channel Impedance Value: 399 Ohm
Lead Channel Impedance Value: 475 Ohm
Lead Channel Pacing Threshold Amplitude: 1.25 V
Lead Channel Pacing Threshold Amplitude: 2.125 V
Lead Channel Pacing Threshold Pulse Width: 0.4 ms
Lead Channel Pacing Threshold Pulse Width: 0.4 ms
Lead Channel Sensing Intrinsic Amplitude: 2.375 mV
Lead Channel Sensing Intrinsic Amplitude: 2.375 mV
Lead Channel Sensing Intrinsic Amplitude: 7.125 mV
Lead Channel Sensing Intrinsic Amplitude: 7.125 mV
Lead Channel Setting Pacing Amplitude: 2.5 V
Lead Channel Setting Pacing Amplitude: 2.5 V
Lead Channel Setting Pacing Pulse Width: 1 ms
Lead Channel Setting Sensing Sensitivity: 0.9 mV

## 2019-03-18 NOTE — Progress Notes (Signed)
PPM Remote  

## 2019-04-18 ENCOUNTER — Telehealth: Payer: Self-pay | Admitting: Cardiovascular Disease

## 2019-04-18 NOTE — Telephone Encounter (Signed)
New message   Patient states that she is having issues with her discount card for ELIQUIS 5 MG TABS tablet. Please call to discuss.

## 2019-04-18 NOTE — Telephone Encounter (Signed)
The patient stated that the card that was mailed to her would not work. She will come by the office to pick up a new eliquis discount card.   This has been left up front for her.

## 2019-04-20 ENCOUNTER — Other Ambulatory Visit: Payer: Self-pay | Admitting: Cardiovascular Disease

## 2019-04-23 ENCOUNTER — Other Ambulatory Visit: Payer: Self-pay | Admitting: Cardiovascular Disease

## 2019-06-16 ENCOUNTER — Ambulatory Visit (INDEPENDENT_AMBULATORY_CARE_PROVIDER_SITE_OTHER): Payer: Commercial Managed Care - PPO | Admitting: *Deleted

## 2019-06-16 DIAGNOSIS — I480 Paroxysmal atrial fibrillation: Secondary | ICD-10-CM | POA: Diagnosis not present

## 2019-06-16 LAB — CUP PACEART REMOTE DEVICE CHECK
Battery Remaining Longevity: 31 mo
Battery Voltage: 2.95 V
Brady Statistic AP VP Percent: 0.66 %
Brady Statistic AP VS Percent: 99.01 %
Brady Statistic AS VP Percent: 0.04 %
Brady Statistic AS VS Percent: 0.29 %
Brady Statistic RA Percent Paced: 99.53 %
Brady Statistic RV Percent Paced: 0.66 %
Date Time Interrogation Session: 20210513062310
Implantable Lead Implant Date: 20150207
Implantable Lead Implant Date: 20150207
Implantable Lead Location: 753859
Implantable Lead Location: 753860
Implantable Lead Model: 5076
Implantable Lead Model: 5076
Implantable Pulse Generator Implant Date: 20150207
Lead Channel Impedance Value: 361 Ohm
Lead Channel Impedance Value: 361 Ohm
Lead Channel Impedance Value: 418 Ohm
Lead Channel Impedance Value: 475 Ohm
Lead Channel Pacing Threshold Amplitude: 1.125 V
Lead Channel Pacing Threshold Amplitude: 2.5 V
Lead Channel Pacing Threshold Pulse Width: 0.4 ms
Lead Channel Pacing Threshold Pulse Width: 0.4 ms
Lead Channel Sensing Intrinsic Amplitude: 1.75 mV
Lead Channel Sensing Intrinsic Amplitude: 1.75 mV
Lead Channel Sensing Intrinsic Amplitude: 7.25 mV
Lead Channel Sensing Intrinsic Amplitude: 7.25 mV
Lead Channel Setting Pacing Amplitude: 2.5 V
Lead Channel Setting Pacing Amplitude: 2.5 V
Lead Channel Setting Pacing Pulse Width: 1 ms
Lead Channel Setting Sensing Sensitivity: 0.9 mV

## 2019-06-20 NOTE — Progress Notes (Signed)
Remote pacemaker transmission.   

## 2019-07-15 ENCOUNTER — Other Ambulatory Visit: Payer: Self-pay | Admitting: Cardiovascular Disease

## 2019-07-18 ENCOUNTER — Other Ambulatory Visit: Payer: Self-pay | Admitting: Cardiovascular Disease

## 2019-08-18 ENCOUNTER — Other Ambulatory Visit: Payer: Self-pay

## 2019-08-18 ENCOUNTER — Encounter: Payer: Self-pay | Admitting: Cardiovascular Disease

## 2019-08-18 ENCOUNTER — Ambulatory Visit (INDEPENDENT_AMBULATORY_CARE_PROVIDER_SITE_OTHER): Payer: Commercial Managed Care - PPO | Admitting: Cardiovascular Disease

## 2019-08-18 VITALS — BP 136/79 | HR 86 | Ht 62.0 in | Wt 283.6 lb

## 2019-08-18 DIAGNOSIS — I495 Sick sinus syndrome: Secondary | ICD-10-CM

## 2019-08-18 DIAGNOSIS — Z7901 Long term (current) use of anticoagulants: Secondary | ICD-10-CM

## 2019-08-18 DIAGNOSIS — E669 Obesity, unspecified: Secondary | ICD-10-CM

## 2019-08-18 DIAGNOSIS — Z950 Presence of cardiac pacemaker: Secondary | ICD-10-CM

## 2019-08-18 DIAGNOSIS — I480 Paroxysmal atrial fibrillation: Secondary | ICD-10-CM

## 2019-08-18 DIAGNOSIS — I10 Essential (primary) hypertension: Secondary | ICD-10-CM | POA: Diagnosis not present

## 2019-08-18 NOTE — Patient Instructions (Signed)

## 2019-08-18 NOTE — Progress Notes (Signed)
Cardiology Office Note   Date:  08/18/2019   ID:  CHEVON [name], DOB 10-07-1965, MRN [id number]  PCP:  Juluis Rainier, MD  Cardiologist:  Thurmon Fair, MD  Electrophysiologist:  None   Evaluation Performed:  Follow-Up Visit  Chief Complaint: Pacemaker follow-up, atrial flutter  History of Present Illness:    Priscilla [name] is a 54 y.o. female with a history of atrial flutter/atrial fibrillation with previous radiofrequency ablation followed by atrial standstill and pacemaker implantation.  She has longstanding problems with morbid obesity and her current weight actually places her in the super obese category.  She expresses interest in bariatric surgery.  Overall she feels quite well.  She is planning to enroll in a formal bariatric program either at [facility] or [facility].  She has had very infrequent palpitations that have not bothered her much.  She denies shortness of breath other than her usual pattern.  She is trying to increase her exercise to 30 minutes a day.  She has not had dizziness or syncope and denies problems with angina or claudication.  She has not had any bleeding problems and is compliant with anticoagulation.  She denies focal neurological events.  Pacemaker interrogation shows the same very low burden of atrial flutter and atrial fibrillation.  The overall prevalence is only 0.2% of the time.  Many of the episodes appear to be fairly organized, likely atypical left atrial flutter.  About 40% of the time automatic pacemaker antitachycardia therapies are successful at terminating this arrhythmia.  At other times the rhythm is quite disorganized and resembles atrial fibrillation.  Ventricular rate control is generally good and there have been no recordings of high ventricular rates.  As before the ventricular pacing threshold is a little high, but this is not a big issue since she only has 0.6% ventricular pacing.  She has virtually 100% atrial pacing and her  heart rate histogram distribution appears appropriate for her activity level.  Atrial lead parameters are excellent.  Estimated generator longevity is 2.5 years.  She had a normal sleep study 2018.  She had iron deficiency anemia related to menorrhagia.  She has treated hypothyroidism.   She has a history of normal coronary arteries by previous angiography. In the past, her chest discomfort seemed to improve when beta blocker dose was reduced and her AV delay was liberalized to reduce the incidence of ventricular pacing. Echo in June 2016 showed normal left ventricular systolic function. Right heart chambers were slightly dilated, but a sleep study performed since that time has showed no evidence of sleep apnea.Dr. Johney Frame EP evaluation February 2017: "... significant atriopathy. I anticipate that she will continue to have progressive atrial arrhythmias in the future. Further ablation would carry low success and likely very little benefit. If she has robust increase in atrial arrhythmias, could consider AAD therapy in the future...."   Past Medical History:  Diagnosis Date  . Atrial fibrillation (HCC) 2015  . Atrial flutter (HCC) 2015   status post a flutter ablation and subsequent Medtronic pacemaker, battery 2015  . History of colonoscopy 01/23/15  . History of mammogram 11/13/14  . Hypertension   . Hypothyroidism   . Obese   . Sick sinus syndrome (HCC) 08/01/2014   Dual chamber Medtronic advisa MRI conditional implanted February 2015   Past Surgical History:  Procedure Laterality Date  . ATRIAL FLUTTER ABLATION  2015   in [place]  . BLADDER SUSPENSION N/A 01/05/2015   Procedure: TRANSVAGINAL TAPE (TVT) PROCEDURE;  Surgeon: Osborn Coho, MD;  Location: [facility];  Service: Gynecology;  Laterality: N/A;  . CARDIAC CATHETERIZATION  03/2013  . CARDIOVERSION N/A 12/18/2015   Procedure: CARDIOVERSION;  Surgeon: Thurmon Fair, MD;  Location: [facility];  Service: Cardiovascular;   Laterality: N/A;  . CESAREAN SECTION     x3  . CYSTOSCOPY N/A 01/05/2015   Procedure: CYSTOSCOPY;  Surgeon: Osborn Coho, MD;  Location: [facility];  Service: Gynecology;  Laterality: N/A;  . PACEMAKER INSERTION  03/2013   Medtronic Advisa DR MRI implanted in [place] for sick sinus syndrome     Current Meds  Medication Sig  . cholecalciferol (VITAMIN D3) 25 MCG (1000 UNIT) tablet Take 2,000 Units by mouth daily.  Marland Kitchen diltiazem (CARDIZEM) 120 MG tablet TAKE 3 TABLETS (360MG       TOTAL)  DAILY  . ELIQUIS 5 MG TABS tablet Take 1 tablet by mouth twice daily  . levothyroxine (SYNTHROID, LEVOTHROID) 25 MCG tablet Take 25 mcg by mouth daily.   . Melatonin 5 MG TABS Take 1 tablet by mouth at bedtime as needed (for sleep).   . metoprolol tartrate (LOPRESSOR) 50 MG tablet TAKE 1/2 TABLET TWICE A DAY  . Multiple Vitamins-Minerals (CENTRUM SILVER ADULT 50+ PO) Take 1 tablet by mouth daily.     Allergies:   Patient has no known allergies.   Social History   Tobacco Use  . Smoking status: Never Smoker  . Smokeless tobacco: Never Used  Substance Use Topics  . Alcohol use: Yes    Alcohol/week: 2.0 - 4.0 standard drinks    Types: 2 - 4 Glasses of wine per week  . Drug use: No     Family Hx: The patient's family history includes Breast cancer in her maternal grandmother; Heart attack (age of onset: 12) in her paternal grandfather; Heart attack (age of onset: 91) in her father; Rheumatic fever in her mother; Stroke in her mother.  ROS:   Please see the history of present illness.    All other systems are reviewed and are negative.   Prior CV studies:   The following studies were reviewed today:  Comprehensive pacemaker check in the office today  Labs/Other Tests and Data Reviewed:    EKG: Ordered today shows atrial paced, ventricular sensed rhythm with prolonged AV delay (290 ms), left axis deviation, left bundle branch block (158 ms), QTC 446 ms  Recent Labs: No results found for requested  labs within last 8760 hours.  06/10/2019 Hemoglobin 13.4, creatinine 0.88, potassium 4.4 Recent Lipid Panel No results found for: CHOL, TRIG, HDL, CHOLHDL, LDLCALC, LDLDIRECT 06/10/2019 Total cholesterol 152, HDL 61, LDL 72, triglycerides 105  Wt Readings from Last 3 Encounters:  08/18/19 283 lb 9.6 oz (128.6 kg)  07/14/18 275 lb (124.7 kg)  01/13/18 271 lb 3.2 oz (123 kg)     Objective:    Vital Signs:  BP 136/79   Pulse 86   Ht 5\' 2"  (1.575 m)   Wt 283 lb 9.6 oz (128.6 kg)   SpO2 98%   BMI 51.87 kg/m    VITAL SIGNS:  reviewed Unable to examine  ASSESSMENT & PLAN:    1. AFib/AFlutter: Appropriately anticoagulated.  CHA2DS2-VASc at least 2 (gender, hypertension), but embolic risk elevated due to significant atriopathy/atrial standstill.  The burden of arrhythmia is very low at 0.2% and atrial therapies sometimes appear to be successful in converting the arrhythmia, albeit without a "clean break" most of the time. 2. SSS: "Atrially pacemaker dependent", atrial standstill.  Heart rate histogram remains a little blunted but is probably appropriate considering her activity level of roughly 2 hours a day. 3. PM: Normal device function.  Successfully avoiding ventricular pacing, albeit with a long AV conduction time (she was very poorly tolerant of ventricular pacing in the past). 4. Anticoagulation: Compliant with Eliquis without bleeding problems, injuries or falls. 5. Super obesity: I am still of the opinion that she is a good candidate for bariatric surgery and this will increase the likelihood that she will have fewer atrial arrhythmiae. 6. HTN: Adequate control.   COVID-19 Education: The signs and symptoms of COVID-19 were discussed with the patient and how to seek care for testing (follow up with PCP or arrange E-visit).  The importance of social distancing was discussed today.  Time:   Today, I have spent 14 minutes with the patient with telehealth technology discussing the  above problems.     Medication Adjustments/Labs and Tests Ordered: Current medicines are reviewed at length with the patient today.  Concerns regarding medicines are outlined above.   Tests Ordered: Orders Placed This Encounter  Procedures  . EKG 12-Lead    Medication Changes: No orders of the defined types were placed in this encounter.  Patient Instructions  Medication Instructions:  No changes *If you need a refill on your cardiac medications before your next appointment, please call your pharmacy*   Lab Work: None ordered If you have labs (blood work) drawn today and your tests are completely normal, you will receive your results only by: Marland Kitchen MyChart Message (if you have MyChart) OR . A paper copy in the mail If you have any lab test that is abnormal or we need to change your treatment, we will call you to review the results.   Testing/Procedures: None ordered   Follow-Up: At [facility], you and your health needs are our priority.  As part of our continuing mission to provide you with exceptional heart care, we have created designated Provider Care Teams.  These Care Teams include your primary Cardiologist (physician) and Advanced Practice Providers (APPs -  Physician Assistants and Nurse Practitioners) who all work together to provide you with the care you need, when you need it.  We recommend signing up for the patient portal called "MyChart".  Sign up information is provided on this After Visit Summary.  MyChart is used to connect with patients for Virtual Visits (Telemedicine).  Patients are able to view lab/test results, encounter notes, upcoming appointments, etc.  Non-urgent messages can be sent to your provider as well.   To learn more about what you can do with MyChart, go to [email or web address].    Your next appointment:   12 month(s)  The format for your next appointment:   In Person  Provider:   You may see Thurmon Fair, MD or one of the following  Advanced Practice Providers on your designated Care Team:    Azalee Course, PA-C  Micah Flesher, PA-C or   Judy Pimple, [place]     Disposition:  Follow up 12 months  Signed, Thurmon Fair, MD  08/18/2019 6:16 PM    [facility]

## 2019-09-05 ENCOUNTER — Encounter: Payer: Self-pay | Admitting: Cardiovascular Disease

## 2019-09-05 ENCOUNTER — Telehealth: Payer: Self-pay | Admitting: *Deleted

## 2019-09-05 NOTE — Telephone Encounter (Signed)
The patient has been made aware that medical clearance has been faxed to [facility] for bariatric surgery at [phone]  See epic letter.

## 2019-09-15 ENCOUNTER — Ambulatory Visit (INDEPENDENT_AMBULATORY_CARE_PROVIDER_SITE_OTHER): Payer: Commercial Managed Care - PPO | Admitting: *Deleted

## 2019-09-15 DIAGNOSIS — I495 Sick sinus syndrome: Secondary | ICD-10-CM | POA: Diagnosis not present

## 2019-09-15 LAB — CUP PACEART REMOTE DEVICE CHECK
Battery Remaining Longevity: 34 mo
Battery Voltage: 2.95 V
Brady Statistic AP VP Percent: 0.42 %
Brady Statistic AP VS Percent: 99.42 %
Brady Statistic AS VP Percent: 0.02 %
Brady Statistic AS VS Percent: 0.15 %
Brady Statistic RA Percent Paced: 99.76 %
Brady Statistic RV Percent Paced: 0.41 %
Date Time Interrogation Session: 20210812081223
Implantable Lead Implant Date: 20150207
Implantable Lead Implant Date: 20150207
Implantable Lead Location: 753859
Implantable Lead Location: 753860
Implantable Lead Model: 5076
Implantable Lead Model: 5076
Implantable Pulse Generator Implant Date: 20150207
Lead Channel Impedance Value: 342 Ohm
Lead Channel Impedance Value: 380 Ohm
Lead Channel Impedance Value: 437 Ohm
Lead Channel Impedance Value: 456 Ohm
Lead Channel Pacing Threshold Amplitude: 1 V
Lead Channel Pacing Threshold Amplitude: 2.5 V
Lead Channel Pacing Threshold Pulse Width: 0.4 ms
Lead Channel Pacing Threshold Pulse Width: 0.4 ms
Lead Channel Sensing Intrinsic Amplitude: 2.25 mV
Lead Channel Sensing Intrinsic Amplitude: 2.25 mV
Lead Channel Sensing Intrinsic Amplitude: 5.75 mV
Lead Channel Sensing Intrinsic Amplitude: 5.75 mV
Lead Channel Setting Pacing Amplitude: 2.25 V
Lead Channel Setting Pacing Amplitude: 2.5 V
Lead Channel Setting Pacing Pulse Width: 1 ms
Lead Channel Setting Sensing Sensitivity: 0.9 mV

## 2019-09-19 NOTE — Progress Notes (Signed)
Remote pacemaker transmission.   

## 2019-10-16 ENCOUNTER — Other Ambulatory Visit: Payer: Self-pay | Admitting: Cardiovascular Disease

## 2019-10-17 ENCOUNTER — Other Ambulatory Visit: Payer: Self-pay | Admitting: Cardiovascular Disease

## 2019-11-02 ENCOUNTER — Telehealth: Payer: Self-pay | Admitting: *Deleted

## 2019-11-02 NOTE — Telephone Encounter (Signed)
   Primary Cardiologist: Thurmon Fair, MD  Chart revisited as part of pre-operative protocol coverage. Given past medical history and conversation with patient today, based on ACC/AHA guidelines, the patient would be at acceptable risk for the planned procedure without further cardiovascular testing.   The patient was advised that if she develops new symptoms prior to surgery to contact our office to arrange for a follow-up visit, and she verbalized understanding.  Per office protocol, patient can hold Eliquis for 2 days prior to procedure.    I will route this recommendation to the requesting party via Epic fax function and remove from pre-op pool.  Please call with questions.  Laurann Montana, PA-C 11/02/2019, 11:06 AM

## 2019-11-02 NOTE — Telephone Encounter (Signed)
Patient with diagnosis of afib on Eliquis for anticoagulation.    Procedure: Sleeve gastrectomy  Date of procedure: TBD  CHADS2-VASc score of  2  (HTN, female)  CrCl 91 ml/min  Per office protocol, patient can hold Eliquis for 2 days prior to procedure.

## 2019-11-02 NOTE — Telephone Encounter (Signed)
   Primary Cardiologist: Thurmon Fair, MD  Chart reviewed as part of pre-operative protocol coverage. Patient was contacted 11/02/2019 in reference to pre-operative risk assessment for pending surgery as outlined below.  Priscilla [name] was last seen on 08/18/19 by Dr. Harrold Donath with history of atrial fib/flutter s/p prior ablation followed by atrial standstill and pacemaker implantation, morbid obesity, IDA, hypothyroidism, prior normal coronary angiography. Last echo 2016 showed normal LV function. In Dr. Erin Hearing last note he indicated he felt she was a good candidate for bariatric surgery. RCRI 0.4% indicating low risk of CV complications. I spoke with the patient today who affirms she's been doing well from cardiac standpoint without any new concerning symptoms. She does not exercise formally but with ADLs including carrying in groceries and routine walking she does not have any concerning symptoms. Therefore, based on ACC/AHA guidelines, the patient would be at acceptable risk for the planned procedure without further cardiovascular testing. The patient was advised that if she develops new symptoms prior to surgery to contact our office to arrange for a follow-up visit, and she verbalized understanding.  Will route to pharm for input on anticoag. [facility] labs available from 06/2019 showing Hgb 13.4 and Cr 0.88.  Laurann Montana, PA-C 11/02/2019, 9:35 AM

## 2019-11-02 NOTE — Telephone Encounter (Signed)
   [facility] Pre-operative Risk Assessment     Request for surgical clearance:  1. What type of surgery is being performed? Sleeve gastrectomy    2. When is this surgery scheduled? TBD  3. What type of clearance is required (medical clearance vs. Pharmacy clearance to hold med vs. Both)? medical  4. Are there any medications that need to be held prior to surgery and how long?  Eliquis  5. Practice name and name of physician performing surgery? [facility] Dr. Kae Heller   6. What is the office phone number? [phone]   7.   What is the office fax number? [phone]  8.   Anesthesia type (None, local, MAC, general) ? General   Priscilla [name] [name] 11/02/2019, 8:12 AM  _________________________________________________________________   (provider comments below)

## 2019-11-04 ENCOUNTER — Other Ambulatory Visit (HOSPITAL_COMMUNITY): Payer: Self-pay | Admitting: Surgery

## 2019-11-04 ENCOUNTER — Other Ambulatory Visit: Payer: Self-pay | Admitting: Surgery

## 2019-11-15 ENCOUNTER — Encounter: Payer: Self-pay | Admitting: Skilled Nursing Facility1

## 2019-11-15 ENCOUNTER — Other Ambulatory Visit: Payer: Self-pay

## 2019-11-15 ENCOUNTER — Encounter: Payer: Commercial Managed Care - PPO | Attending: Surgery | Admitting: Skilled Nursing Facility1

## 2019-11-15 DIAGNOSIS — E669 Obesity, unspecified: Secondary | ICD-10-CM | POA: Insufficient documentation

## 2019-11-15 NOTE — Progress Notes (Signed)
Nutrition Assessment for Bariatric Surgery Medical Nutrition Therapy  Patient was seen on 11/15/2019 for Pre-Operative Nutrition Assessment. Letter of approval faxed to [facility] bariatric surgery program coordinator on 11/15/2019.   Referral stated Supervised Weight Loss (SWL) visits needed: 6 per pt  Planned surgery: sleeve gastrectomy Pt expectation of surgery: to lose weight Pt expectation of dietitian: to help guide    NUTRITION ASSESSMENT   Anthropometrics  Start weight at [facility]: 284 lbs (date: 11/15/2019)  Height: 61 in BMI: 53.66 kg/m2     Clinical  Medical hx: diabetes, HTN, Pacemaker  Medications: see list Labs: A1C 6.5 Notable signs/symptoms:  Any previous deficiencies? Vitamin D   Micronutrient Nutrition Focused Physical Exam: Hair: No issues observed Eyes: No issues observed Mouth: No issues observed Neck: No issues observed Nails: No issues observed Skin: No issues observed  Lifestyle & Dietary Hx  Pt states with her child moving out and her husband making the changes with her she feels she is in a really god place to make changes. Pt states she wants to e active after dinner each evening. Pt states she thinks she will do a meal delivery system like nutrisystem because she really does not like to figure out what to eat and just wants to get through her meals as quickly as possible.  Pt states cooking really stresses her out feeling lonely when she does feels unsuccessful with it.  Pt states she does not check her blood sugars. Pt states they moved from [place] about 5 years ago so she feels lonely not having any friends. Pt states she really struggles picking out what she wants to eat and creates a lot of stress. Pt had trouble recalling what she has been eating. Speak no evil; had trouble admitting she ate out with consistency.    Pt state she is noticing her stomachs hurts more and has more sensitivities in the past 5 years: drinking alcohol on an  empty stomach; cold mixed drinks, gummy treats, spicy foods. Pt states she does not have any chest pressure or reflux but does have some belching.   Pt states also needed to clean her plate as a child so now she fears her portion control which Is a part of why she is so fearful of her meals.   24-Hr Dietary Recall First Meal: skipped or cereal or atkins bars Snack:  Second Meal: sandwich + clementine or salad (fast food) Snack:  Third Meal: shrimp + grits + green beans Snack:  Beverages: soda, unsweet tea, water   Estimated Energy Needs Calories: 1500   NUTRITION DIAGNOSIS  Overweight/obesity ([place]-3.3) related to past poor dietary habits and physical inactivity as evidenced by patient w/ planned sleeve gastrectomy surgery following dietary guidelines for continued weight loss.    NUTRITION INTERVENTION  Nutrition counseling (C-1) and education (E-2) to facilitate bariatric surgery goals.   Pre-Op Goals Reviewed with the Patient . Track food and beverage intake (pen and paper, MyFitness Pal, Baritastic app, etc.) . Make healthy food choices while monitoring portion sizes . Consume 3 meals per day or try to eat every 3-5 hours . Avoid concentrated sugars and fried foods . Keep sugar & fat in the single digits per serving on food labels . Practice CHEWING your food (aim for applesauce consistency) . Practice not drinking 15 minutes before, during, and 30 minutes after each meal and snack . Avoid all carbonated beverages (ex: soda, sparkling beverages)  . Limit caffeinated beverages (ex: coffee, tea, energy drinks) .  Avoid all sugar-sweetened beverages (ex: regular soda, sports drinks)  . Avoid alcohol  . Aim for 64-100 ounces of FLUID daily (with at least half of fluid intake being plain water)  . Aim for at least 60-80 grams of PROTEIN daily . Look for a liquid protein source that contains ?15 g protein and ?5 g carbohydrate (ex: shakes, drinks, shots) . Make a list of non-food  related activities . Physical activity is an important part of a healthy lifestyle so keep it moving! The goal is to reach 150 minutes of exercise per week, including cardiovascular and weight baring activity.  *Goals that are bolded indicate the pt would like to start working towards these  Handouts Provided Include  . Bariatric Surgery handouts (Nutrition Visits, Pre-Op Goals, Protein Shakes, Vitamins & Minerals) . Worked on meal ideas and how to put meals together with specific examples using the detailed MyPlate  Learning Style & Readiness for Change Teaching method utilized: Visual & Auditory  Demonstrated degree of understanding via: Teach Back  Barriers to learning/adherence to lifestyle change: relationship with food   RD's Notes for Next Visit . Assess pts adherence to chosen goals . Check on pts feeling surrounding food and cooking     MONITORING & EVALUATION Dietary intake, weekly physical activity, body weight, and pre-op goals reached at next nutrition visit.    Next Steps  Patient is to follow up at [facility] for Pre-Op Class >2 weeks before surgery for further nutrition education.

## 2019-11-16 ENCOUNTER — Ambulatory Visit (HOSPITAL_COMMUNITY)
Admission: RE | Admit: 2019-11-16 | Discharge: 2019-11-16 | Disposition: A | Discharge status: Home / Self Care | Payer: Commercial Managed Care - PPO | Source: Ambulatory Visit | Attending: Surgery | Admitting: Surgery

## 2019-11-16 ENCOUNTER — Other Ambulatory Visit: Payer: Self-pay

## 2019-11-24 ENCOUNTER — Ambulatory Visit: Payer: Commercial Managed Care - PPO | Admitting: Skilled Nursing Facility1

## 2019-12-01 ENCOUNTER — Ambulatory Visit: Payer: Commercial Managed Care - PPO | Admitting: Dietician

## 2019-12-14 ENCOUNTER — Other Ambulatory Visit: Payer: Self-pay

## 2019-12-14 ENCOUNTER — Encounter: Payer: Commercial Managed Care - PPO | Attending: Surgery | Admitting: Skilled Nursing Facility1

## 2019-12-14 DIAGNOSIS — E669 Obesity, unspecified: Secondary | ICD-10-CM | POA: Diagnosis not present

## 2019-12-14 NOTE — Progress Notes (Signed)
Supervised Weight Loss Visit Bariatric Nutrition Education  Planned Surgery: sleeve  Pt Expectation of Surgery/ Goals: to lose weight  1 out of 6 SWL Appointments   Star given previously: no  NUTRITION ASSESSMENT  Anthropometrics  Start weight at [facility]: 284 lbs (date: 11/15/2019)  Weight: 275 Height: 61 in BMI: 53.66 kg/m2     Clinical  Medical hx: diabetes, HTN, Pacemaker  Medications: see list Labs: A1C 6.5 Notable signs/symptoms:  Any previous deficiencies? Vitamin D   Lifestyle & Dietary Hx  Pt arrives having lost about 9 pounds. Pt states she is doing nutri system. Pt states she set up her workout equipment so it works for her.  Pt states she just got her meter in: Dietitian educated pt on how to use her meter ; checked her blood sugar 92.     Estimated daily fluid intake: 64+  oz Supplements:  Current average weekly physical activity: 20 minutes treadmill, 10 minutes recumbent with some light weights  24-Hr Dietary Recall First Meal: peanut butter oat bar and strawberry yogurt Snack: ptretzel nuggut Second Meal: 3 cheese chicken 1 whole tomato  Snack: peanut butter shake Third Meal: large salad: carrots, atrichoke, spinach, cherry tomatoes, black olives [place] dressing mac n cheese and [place] sausage  Snack: choclate pretzel bar Beverages: tea, water  Estimated Energy Needs Calories: 1500  NUTRITION DIAGNOSIS  Overweight/obesity ([place]-3.3) related to past poor dietary habits and physical inactivity as evidenced by patient w/ planned sleeve surgery following dietary guidelines for continued weight loss.   NUTRITION INTERVENTION  Nutrition counseling (C-1) and education (E-2) to facilitate bariatric surgery goals.  Pre-Op Goals Progress & New Goals . Work on not drinking with meals   Handouts Provided Include     Learning Style & Readiness for Change Teaching method utilized: Visual & Auditory  Demonstrated degree of understanding via: Teach Back   Barriers to learning/adherence to lifestyle change: none identified   RD's Notes for next Visit  . Assess pts adherence to chosen goals    MONITORING & EVALUATION Dietary intake, weekly physical activity, body weight, and pre-op goals in 1 month.   Next Steps  Patient is to return to [facility] in 1 months

## 2019-12-15 ENCOUNTER — Ambulatory Visit (INDEPENDENT_AMBULATORY_CARE_PROVIDER_SITE_OTHER): Payer: Commercial Managed Care - PPO

## 2019-12-15 DIAGNOSIS — I480 Paroxysmal atrial fibrillation: Secondary | ICD-10-CM | POA: Diagnosis not present

## 2019-12-15 LAB — CUP PACEART REMOTE DEVICE CHECK
Battery Remaining Longevity: 31 mo
Battery Voltage: 2.95 V
Brady Statistic AP VP Percent: 0.59 %
Brady Statistic AP VS Percent: 99.28 %
Brady Statistic AS VP Percent: 0.01 %
Brady Statistic AS VS Percent: 0.12 %
Brady Statistic RA Percent Paced: 99.78 %
Brady Statistic RV Percent Paced: 0.57 %
Date Time Interrogation Session: 20211111071651
Implantable Lead Implant Date: 20150207
Implantable Lead Implant Date: 20150207
Implantable Lead Location: 753859
Implantable Lead Location: 753860
Implantable Lead Model: 5076
Implantable Lead Model: 5076
Implantable Pulse Generator Implant Date: 20150207
Lead Channel Impedance Value: 342 Ohm
Lead Channel Impedance Value: 399 Ohm
Lead Channel Impedance Value: 475 Ohm
Lead Channel Impedance Value: 475 Ohm
Lead Channel Pacing Threshold Amplitude: 1.125 V
Lead Channel Pacing Threshold Amplitude: 2.25 V
Lead Channel Pacing Threshold Pulse Width: 0.4 ms
Lead Channel Pacing Threshold Pulse Width: 0.4 ms
Lead Channel Sensing Intrinsic Amplitude: 3.75 mV
Lead Channel Sensing Intrinsic Amplitude: 3.75 mV
Lead Channel Sensing Intrinsic Amplitude: 7.875 mV
Lead Channel Sensing Intrinsic Amplitude: 7.875 mV
Lead Channel Setting Pacing Amplitude: 2.5 V
Lead Channel Setting Pacing Amplitude: 2.5 V
Lead Channel Setting Pacing Pulse Width: 1 ms
Lead Channel Setting Sensing Sensitivity: 0.9 mV

## 2019-12-17 ENCOUNTER — Other Ambulatory Visit: Payer: Self-pay | Admitting: Cardiovascular Disease

## 2019-12-19 NOTE — Progress Notes (Signed)
Remote pacemaker transmission.   

## 2020-01-12 ENCOUNTER — Other Ambulatory Visit: Payer: Self-pay

## 2020-01-12 ENCOUNTER — Encounter: Payer: Commercial Managed Care - PPO | Attending: Surgery | Admitting: Skilled Nursing Facility1

## 2020-01-12 DIAGNOSIS — E669 Obesity, unspecified: Secondary | ICD-10-CM | POA: Insufficient documentation

## 2020-01-12 NOTE — Progress Notes (Signed)
Supervised Weight Loss Visit Bariatric Nutrition Education  Planned Surgery: sleeve  Pt Expectation of Surgery/ Goals: to lose weight  2 out of 6 SWL Appointments   Star given previously: no  NUTRITION ASSESSMENT  Anthropometrics  Start weight at [facility]: 284 lbs (date: 11/15/2019)  Weight: 269 Height: 61 in BMI: 51.00 kg/m2     Clinical  Medical hx: diabetes, HTN, Pacemaker  Medications: see list Labs: A1C 6.2 Notable signs/symptoms:  Any previous deficiencies? Vitamin D   Lifestyle & Dietary Hx   Pt states she has been checking her blood sugar numbers: fasting-108, 113, 108, 139. Pt states she has trouble getting enough blood from her finger to test.  Pt states she is still using nutri system planning on using it through the end of the 6 months.  Pt keeps a diary of her meals.   Estimated daily fluid intake: 64+  oz Supplements:  Current average weekly physical activity: 20 minutes treadmill, 10 minutes recumbent with some light weights  24-Hr Dietary Recall: all nutrisystem First Meal: orange cranberry muffin + yogurt + tea Snack: probiotic shake Second Meal: large salad (premade) added tomatoes and lite ranch Snack: snack mix Third Meal: fresh green beans + stuffing + 5oz [place] + mashed potatoes + gravy + cranberry sauce (Tahnksgiving) Snack: choclate pretzel bar  Beverages: tea, water  Estimated Energy Needs Calories: 1500  NUTRITION DIAGNOSIS  Overweight/obesity ([place]-3.3) related to past poor dietary habits and physical inactivity as evidenced by patient w/ planned sleeve surgery following dietary guidelines for continued weight loss.   NUTRITION INTERVENTION  Nutrition counseling (C-1) and education (E-2) to facilitate bariatric surgery goals.  Pre-Op Goals Progress & New Goals . Continue: Work on not drinking with meals  . NEW: chew until applesauce consistency; understanding your own satisfaction verses hunger cues  Handouts Provided Include      Learning Style & Readiness for Change Teaching method utilized: Visual & Auditory  Demonstrated degree of understanding via: Teach Back  Barriers to learning/adherence to lifestyle change: none identified   RD's Notes for next Visit  . Assess pts adherence to chosen goals    MONITORING & EVALUATION Dietary intake, weekly physical activity, body weight, and pre-op goals in 1 month.   Next Steps  Patient is to return to [facility] in 1 months

## 2020-02-13 ENCOUNTER — Other Ambulatory Visit: Payer: Self-pay

## 2020-02-13 ENCOUNTER — Encounter: Payer: Commercial Managed Care - PPO | Attending: Surgery | Admitting: Skilled Nursing Facility1

## 2020-02-13 NOTE — Progress Notes (Signed)
Supervised Weight Loss Visit Bariatric Nutrition Education  Planned Surgery: sleeve  Pt Expectation of Surgery/ Goals: to lose weight  3 out of 6 SWL Appointments   Star given previously: no  NUTRITION ASSESSMENT  Anthropometrics  Start weight at [facility]: 284 lbs (date: 11/15/2019)  Weight: 263 Height: 61 in BMI: 49.73 kg/m2     Clinical  Medical hx: diabetes, HTN, Pacemaker  Medications: see list Labs: A1C 6.2 Notable signs/symptoms:  Any previous deficiencies? Vitamin D   Lifestyle & Dietary Hx   Pt states she has been checking her blood sugar numbers: fasting-108, 113, 108, 139. Pt states she is still using nutri system planning on using it through the end of the 6 months.  Pt keeps a diary of her meals.  Pt states she did indulge in some holiday foods but kept up with her physical activity routine. Pt states she does well with chewing well and not drinking with meals. Pt states she comes up with strategies so she is less likely to fail. Pt states her husband has been working out with her and has been very supportive.   Estimated daily fluid intake: 64+  oz Supplements:  Current average weekly physical activity: 20 minutes treadmill, 10 minutes recumbent with some light weights  24-Hr Dietary Recall: all nutrisystem First Meal: orange cranberry muffin + yogurt + tea or [place] ham and cheese omelet + fruit Snack: probiotic shake or nothing Second Meal: large salad (premade) added tomatoes and lite ranch Snack: snack mix Third Meal: fresh green beans + stuffing + 5oz [place] + mashed potatoes + gravy + cranberry sauce (Tahnksgiving) or nutri system chicken parmesan + steamed broccoli Snack: choclate pretzel bar  Beverages: tea, water  Estimated Energy Needs Calories: 1500  NUTRITION DIAGNOSIS  Overweight/obesity ([place]-3.3) related to past poor dietary habits and physical inactivity as evidenced by patient w/ planned sleeve surgery following dietary guidelines for  continued weight loss.   NUTRITION INTERVENTION  Nutrition counseling (C-1) and education (E-2) to facilitate bariatric surgery goals.  Pre-Op Goals Progress & New Goals . Continue: Work on not drinking with meals  . Continue: chew until applesauce consistency . NEW: understand your own satisfaction verses hunger cues and other body cues   Handouts Provided Include     Learning Style & Readiness for Change Teaching method utilized: Visual & Auditory  Demonstrated degree of understanding via: Teach Back  Barriers to learning/adherence to lifestyle change: none identified   RD's Notes for next Visit  . Assess pts adherence to chosen goals    MONITORING & EVALUATION Dietary intake, weekly physical activity, body weight, and pre-op goals in 1 month.   Next Steps  Patient is to return to [facility] in 1 months

## 2020-03-14 ENCOUNTER — Other Ambulatory Visit: Payer: Self-pay

## 2020-03-14 ENCOUNTER — Encounter: Payer: Commercial Managed Care - PPO | Attending: Surgery | Admitting: Skilled Nursing Facility1

## 2020-03-14 DIAGNOSIS — E119 Type 2 diabetes mellitus without complications: Secondary | ICD-10-CM | POA: Insufficient documentation

## 2020-03-14 NOTE — Progress Notes (Signed)
Supervised Weight Loss Visit Bariatric Nutrition Education  Planned Surgery: sleeve  Pt Expectation of Surgery/ Goals: to lose weight  4 out of 6 SWL Appointments   Star given previously: no  NUTRITION ASSESSMENT  Anthropometrics  Start weight at [facility]: 284 lbs (date: 11/15/2019)  Weight: 259 Height: 61 in BMI: 49.11 kg/m2     Clinical  Medical hx: diabetes, HTN, Pacemaker  Medications: see list Labs: A1C 6.2 Notable signs/symptoms:  Any previous deficiencies? Vitamin D   Lifestyle & Dietary Hx  Pt state her hair has been getting thinner chalking it up to ageing.   Pt states she has been checking her blood sugar numbers: fasting-80's-90's Pt states she comes up with strategies so she is less likely to fail. Pt states her husband has been working out with her and has been very supportive.   Pt states she has gotten a little bored with her foods so she has had a couple things that are not in the plan recognizing there is a limit to this and tries to be more active. Pt states her she has been getting her husband to eat more non starchy vegetables.    Estimated daily fluid intake: 64+  oz Supplements:  Current average weekly physical activity: 20 minutes treadmill, 10 minutes recumbent with some light weights  24-Hr Dietary Recall: all nutrisystem continued First Meal: orange cranberry muffin + yogurt + tea or [place] ham and cheese omelet + fruit Snack: probiotic shake or nothing Second Meal: large salad (premade) added tomatoes and lite ranch Snack: snack mix Third Meal: fresh green beans + stuffing + 5oz [place] + mashed potatoes + gravy + cranberry sauce (Tahnksgiving) or nutri system chicken parmesan + steamed broccoli Snack: choclate pretzel bar  Beverages: tea, water  Estimated Energy Needs Calories: 1500  NUTRITION DIAGNOSIS  Overweight/obesity ([place]-3.3) related to past poor dietary habits and physical inactivity as evidenced by patient w/ planned sleeve surgery  following dietary guidelines for continued weight loss.   NUTRITION INTERVENTION  Nutrition counseling (C-1) and education (E-2) to facilitate bariatric surgery goals.  Pre-Op Goals Progress & New Goals . Continue: Work on not drinking with meals  . Continue: chew until applesauce consistency . Continue: understand your own satisfaction verses hunger cues and other body cues  . NEW: hypoglycemia awareness just in case  Handouts Provided Include     Learning Style & Readiness for Change Teaching method utilized: Visual & Auditory  Demonstrated degree of understanding via: Teach Back  Barriers to learning/adherence to lifestyle change: none identified   RD's Notes for next Visit  . Assess pts adherence to chosen goals    MONITORING & EVALUATION Dietary intake, weekly physical activity, body weight, and pre-op goals in 1 month.   Next Steps  Patient is to return to [facility] in 1 months

## 2020-03-15 ENCOUNTER — Ambulatory Visit (INDEPENDENT_AMBULATORY_CARE_PROVIDER_SITE_OTHER): Payer: Commercial Managed Care - PPO

## 2020-03-15 DIAGNOSIS — I495 Sick sinus syndrome: Secondary | ICD-10-CM

## 2020-03-17 LAB — CUP PACEART REMOTE DEVICE CHECK
Battery Remaining Longevity: 28 mo
Battery Voltage: 2.94 V
Brady Statistic AP VP Percent: 0.65 %
Brady Statistic AP VS Percent: 98.67 %
Brady Statistic AS VP Percent: 0.12 %
Brady Statistic AS VS Percent: 0.56 %
Brady Statistic RA Percent Paced: 98.96 %
Brady Statistic RV Percent Paced: 0.73 %
Date Time Interrogation Session: 20220211172533
Implantable Lead Implant Date: 20150207
Implantable Lead Implant Date: 20150207
Implantable Lead Location: 753859
Implantable Lead Location: 753860
Implantable Lead Model: 5076
Implantable Lead Model: 5076
Implantable Pulse Generator Implant Date: 20150207
Lead Channel Impedance Value: 380 Ohm
Lead Channel Impedance Value: 399 Ohm
Lead Channel Impedance Value: 494 Ohm
Lead Channel Impedance Value: 513 Ohm
Lead Channel Pacing Threshold Amplitude: 1 V
Lead Channel Pacing Threshold Amplitude: 2.5 V
Lead Channel Pacing Threshold Pulse Width: 0.4 ms
Lead Channel Pacing Threshold Pulse Width: 0.4 ms
Lead Channel Sensing Intrinsic Amplitude: 2 mV
Lead Channel Sensing Intrinsic Amplitude: 2 mV
Lead Channel Sensing Intrinsic Amplitude: 6.75 mV
Lead Channel Sensing Intrinsic Amplitude: 6.75 mV
Lead Channel Setting Pacing Amplitude: 2.25 V
Lead Channel Setting Pacing Amplitude: 2.5 V
Lead Channel Setting Pacing Pulse Width: 1 ms
Lead Channel Setting Sensing Sensitivity: 0.9 mV

## 2020-03-21 ENCOUNTER — Telehealth: Payer: Self-pay

## 2020-03-21 NOTE — Telephone Encounter (Signed)
Opening error 

## 2020-03-21 NOTE — Progress Notes (Signed)
Remote pacemaker transmission.   

## 2020-04-13 ENCOUNTER — Other Ambulatory Visit: Payer: Self-pay | Admitting: Cardiovascular Disease

## 2020-04-13 NOTE — Telephone Encounter (Signed)
Prescription refill request for Eliquis received. Indication: atrial fibrillation Last office visit: Scr:7/21  croitoru Age: 55 Weight:117.9 kg  Prescription refilled

## 2020-04-16 ENCOUNTER — Other Ambulatory Visit: Payer: Self-pay

## 2020-04-16 ENCOUNTER — Encounter: Payer: Commercial Managed Care - PPO | Attending: Surgery | Admitting: Skilled Nursing Facility1

## 2020-04-16 DIAGNOSIS — E119 Type 2 diabetes mellitus without complications: Secondary | ICD-10-CM | POA: Insufficient documentation

## 2020-04-16 NOTE — Progress Notes (Signed)
Supervised Weight Loss Visit Bariatric Nutrition Education  Planned Surgery: sleeve  Pt Expectation of Surgery/ Goals: to lose weight  5 out of 6 SWL Appointments   Star given previously: no  NUTRITION ASSESSMENT  Anthropometrics  Start weight at [facility]: 284 lbs (date: 11/15/2019)  Weight: 257 Height: 61 in BMI: 48.62 kg/m2     Clinical  Medical hx: diabetes, HTN, Pacemaker  Medications: see list Labs: A1C 6.2 Notable signs/symptoms:  Any previous deficiencies? Vitamin D   Lifestyle & Dietary Hx  Pt state her hair has been getting thinner chalking it up to ageing.   Pt states she has been checking her blood sugar numbers: fasting-100-110  Pt states she has been eating a salad a day and the pre-made has given her a lot of variety and getting the frozen veggies that are roasted.   Estimated daily fluid intake: 64+  oz Supplements:  Current average weekly physical activity: 20 minutes treadmill, 10 minutes recumbent with some light weights  24-Hr Dietary Recall: all nutrisystem continued First Meal: orange cranberry muffin + yogurt + tea or [place] ham and cheese omelet + fruit Snack: probiotic shake or nothing Second Meal: large salad (premade) added tomatoes and lite ranch Snack: snack mix Third Meal:  nutri system chicken parmesan + steamed broccoli Snack: choclate pretzel bar  Beverages: tea, water  Estimated Energy Needs Calories: 1500  NUTRITION DIAGNOSIS  Overweight/obesity (-3.3) related to past poor dietary habits and physical inactivity as evidenced by patient w/ planned sleeve surgery following dietary guidelines for continued weight loss.   NUTRITION INTERVENTION  Nutrition counseling (C-1) and education (E-2) to facilitate bariatric surgery goals.  Pre-Op Goals Progress & New Goals . Continue: Work on not drinking with meals  . Continue: chew until applesauce consistency . Continue: understand your own satisfaction verses hunger cues and other body  cues  . continue: hypoglycemia awareness just in case  Handouts Provided Include     Learning Style & Readiness for Change Teaching method utilized: Visual & Auditory  Demonstrated degree of understanding via: Teach Back  Barriers to learning/adherence to lifestyle change: none identified   RD's Notes for next Visit  . Assess pts adherence to chosen goals    MONITORING & EVALUATION Dietary intake, weekly physical activity, body weight, and pre-op goals in 1 month.   Next Steps  Patient is to return to [facility] in 1 months

## 2020-04-20 ENCOUNTER — Telehealth: Payer: Self-pay | Admitting: *Deleted

## 2020-04-20 NOTE — Telephone Encounter (Signed)
Prior Auth for Eliquis sent through CoverMyMeds: [id number]

## 2020-04-24 ENCOUNTER — Ambulatory Visit (INDEPENDENT_AMBULATORY_CARE_PROVIDER_SITE_OTHER): Payer: Commercial Managed Care - PPO | Admitting: Psychology

## 2020-04-30 NOTE — Telephone Encounter (Signed)
The prior authorization was denied because the patient must try and fail Xarelto first

## 2020-04-30 NOTE — Telephone Encounter (Signed)
Spoke to the patient. She stated that her secondary insurance will still cover it so she will remain on the Eliquis.

## 2020-04-30 NOTE — Telephone Encounter (Signed)
I am fine with switching to Xarelto 20 mg in the evening daily (always take with a meal).

## 2020-05-08 ENCOUNTER — Ambulatory Visit: Payer: Commercial Managed Care - PPO | Admitting: Psychology

## 2020-05-17 ENCOUNTER — Other Ambulatory Visit: Payer: Self-pay

## 2020-05-17 ENCOUNTER — Encounter: Payer: Commercial Managed Care - PPO | Attending: Surgery | Admitting: Skilled Nursing Facility1

## 2020-05-17 DIAGNOSIS — E119 Type 2 diabetes mellitus without complications: Secondary | ICD-10-CM

## 2020-05-17 DIAGNOSIS — E669 Obesity, unspecified: Secondary | ICD-10-CM | POA: Diagnosis present

## 2020-05-17 NOTE — Progress Notes (Signed)
Supervised Weight Loss Visit Bariatric Nutrition Education  Planned Surgery: sleeve  Pt Expectation of Surgery/ Goals: to lose weight  6 out of 6 SWL Appointments    Pt completed visits.  Pt has cleared nutrition requirements.  Star given previously: no  NUTRITION ASSESSMENT  Anthropometrics  Start weight at [facility]: 284 lbs (date: 11/15/2019)  Weight:  253 lb (down 4 lb from last visit) Height: 61 in BMI: 47.80 kg/m2     Clinical  Medical hx: diabetes, HTN, Pacemaker  Medications: see list Labs: A1C 6.2 Notable signs/symptoms:  Any previous deficiencies? Vitamin D   Lifestyle & Dietary Hx  Pt reports she has learned about increasing her water intake, timing of water and meals, chewing food to applesauce consistency.  Pt reports eating 3 - 5 times per day might be a challenge but she has a plan to set an alarm.  Pt reports she has set her schedule to make the surgery and recovery a priority  Pt reports bg b/w 105 - 115mg /dL  Estimated daily fluid intake: 64+  oz Supplements: will buy 1 month's supply of chewable multivitamin with iron Current average weekly physical activity: 20 minutes treadmill, 10 minutes recumbent with some light weights  24-Hr Dietary Recall: all nutrisystem continued First Meal: grapefuit + ham and cheese omelet  Snack: probiotic shake Second Meal: large spinach salad (premade w/eggs, bacon) added tomatoes and 1 Tb lite balsamic vinaigrette + steak and cheese hot pocket Snack: cookie Third Meal:  cranberry & brie stuffed [place] breast + roasted sweet potato/cauliflower/brussels sprouts Snack: chocolate pretzel bar  Beverages: decaf iced tea, water  Estimated Energy Needs Calories: 1500  NUTRITION DIAGNOSIS  Overweight/obesity ([place]-3.3) related to past poor dietary habits and physical inactivity as evidenced by patient w/ planned sleeve surgery following dietary guidelines for continued weight loss.   NUTRITION INTERVENTION   Nutrition counseling (C-1) and education (E-2) to facilitate bariatric surgery goals.  Pre-Op Goals Progress & New Goals . Continue: Work on not drinking with meals  . Continue: chew until applesauce consistency . Continue: understand your own satisfaction verses hunger cues and other body cues  . continue: hypoglycemia awareness just in case  Handouts Provided Include   none  Learning Style & Readiness for Change Teaching method utilized: Visual & Auditory  Demonstrated degree of understanding via: Teach Back  Barriers to learning/adherence to lifestyle change: none identified   RD's Notes for next Visit  . Assess pts adherence to chosen goals    MONITORING & EVALUATION Dietary intake, weekly physical activity, body weight, and pre-op goals  Next Steps  Patient will return for pre-op class Pt has completed visits. No further supervised visits required/recomended

## 2020-06-11 ENCOUNTER — Ambulatory Visit: Payer: Self-pay | Admitting: General Surgery

## 2020-06-14 ENCOUNTER — Ambulatory Visit (INDEPENDENT_AMBULATORY_CARE_PROVIDER_SITE_OTHER): Payer: Commercial Managed Care - PPO

## 2020-06-14 DIAGNOSIS — I480 Paroxysmal atrial fibrillation: Secondary | ICD-10-CM

## 2020-06-14 LAB — CUP PACEART REMOTE DEVICE CHECK
Battery Remaining Longevity: 26 mo
Battery Voltage: 2.93 V
Brady Statistic AP VP Percent: 0.44 %
Brady Statistic AP VS Percent: 99.24 %
Brady Statistic AS VP Percent: 0.02 %
Brady Statistic AS VS Percent: 0.3 %
Brady Statistic RA Percent Paced: 99.52 %
Brady Statistic RV Percent Paced: 0.44 %
Date Time Interrogation Session: 20220512062600
Implantable Lead Implant Date: 20150207
Implantable Lead Implant Date: 20150207
Implantable Lead Location: 753859
Implantable Lead Location: 753860
Implantable Lead Model: 5076
Implantable Lead Model: 5076
Implantable Pulse Generator Implant Date: 20150207
Lead Channel Impedance Value: 361 Ohm
Lead Channel Impedance Value: 399 Ohm
Lead Channel Impedance Value: 418 Ohm
Lead Channel Impedance Value: 494 Ohm
Lead Channel Pacing Threshold Amplitude: 1 V
Lead Channel Pacing Threshold Amplitude: 2 V
Lead Channel Pacing Threshold Pulse Width: 0.4 ms
Lead Channel Pacing Threshold Pulse Width: 0.4 ms
Lead Channel Sensing Intrinsic Amplitude: 2.125 mV
Lead Channel Sensing Intrinsic Amplitude: 2.125 mV
Lead Channel Sensing Intrinsic Amplitude: 6 mV
Lead Channel Sensing Intrinsic Amplitude: 6 mV
Lead Channel Setting Pacing Amplitude: 2 V
Lead Channel Setting Pacing Amplitude: 2.5 V
Lead Channel Setting Pacing Pulse Width: 1 ms
Lead Channel Setting Sensing Sensitivity: 0.9 mV

## 2020-06-18 ENCOUNTER — Other Ambulatory Visit: Payer: Self-pay

## 2020-06-18 ENCOUNTER — Encounter: Payer: Commercial Managed Care - PPO | Attending: General Surgery | Admitting: Skilled Nursing Facility1

## 2020-06-18 NOTE — Progress Notes (Signed)
Pre-Operative Nutrition Class:    Patient was seen on 0516/2022 for Pre-Operative [facility]ariatric Surgery [facility]    Surgery date: 07/09/2020 Surgery type: sleeve Start weight at [facility]: 284 Weight today: 425.4  Samples given per MNT protocol. Patient educated on appropriate usage: [employer] Vitamins Multivitamin Lot # [id number] Exp: 01/23  Pro[employer] Vitamins Calcium  Lot # [id number] Exp: 01/23  Protein20 Shake Lot # [id number] Exp: 06/17/21  The following the learning objectives were met by the patient during this course:  Identify Pre-Op Dietary Goals and will begin 2 weeks pre-operatively  Identify appropriate sources of fluids and proteins   State protein recommendations and appropriate sources pre and post-operatively  Identify Post-Operative Dietary Goals and will follow for 2 weeks post-operatively  Identify appropriate multivitamin and calcium sources  Describe the need for physical activity post-operatively and will follow MD recommendations  State when to call healthcare provider regarding medication questions or post-operative complications  When having a diagnosis of diabetes understanding hypoglycemia symptoms and the inclusion of 1 complex carbohydrate per meal  Handouts given during class include:  Pre-Op Bariatric Surgery Diet Handout  Protein Shake Handout  Post-Op Bariatric Surgery Nutrition Handout  [facility] Program Information Flyer  Support Group Information Flyer  [facility] Bariatric Supplements Price List  Follow-Up Plan: Patient will follow-up at [facility] 2 weeks post operatively for diet advancement per MD.

## 2020-06-19 ENCOUNTER — Other Ambulatory Visit (HOSPITAL_COMMUNITY): Payer: Self-pay

## 2020-06-21 ENCOUNTER — Other Ambulatory Visit: Payer: Self-pay | Admitting: *Deleted

## 2020-06-21 NOTE — Progress Notes (Signed)
Encounter not needed

## 2020-06-22 NOTE — Progress Notes (Signed)
DUE TO COVID-19 ONLY ONE VISITOR IS ALLOWED TO COME WITH YOU AND STAY IN THE WAITING ROOM ONLY DURING PRE OP AND PROCEDURE DAY OF SURGERY. THE 1 VISITOR  MAY VISIT WITH YOU AFTER SURGERY IN YOUR PRIVATE ROOM DURING VISITING HOURS ONLY!  YOU NEED TO HAVE A COVID 19 TEST ON__6/03/2020 _____ @_______ , THIS TEST MUST BE DONE BEFORE SURGERY,  COVID TESTING SITE [address] [place] , IT IS ON THE RIGHT GOING OUT [address] APPROXIMATELY  2 MINUTES PAST [employer] ON THE RIGHT. ONCE YOUR COVID TEST IS COMPLETED,  PLEASE BEGIN THE QUARANTINE INSTRUCTIONS AS OUTLINED IN YOUR HANDOUT.                Priscilla [name]  06/22/2020   Your procedure is scheduled on:  07/09/2020   Report to [facility] Main  Entrance   Report to admitting at    0515 AM     Call this number if you have problems the morning of surgery [phone]    REMEMBER: NO  SOLID FOOD CANDY OR GUM AFTER MIDNIGHT. CLEAR LIQUIDS UNTIL  0415am        . NOTHING BY MOUTH EXCEPT CLEAR LIQUIDS UNTIL     0415am   . PLEASE FINISH ENSURE DRINK PER SURGEON ORDER  WHICH NEEDS TO BE COMPLETED AT    0415am   .      CLEAR LIQUID DIET   Foods Allowed                                                                    Coffee and tea, regular and decaf                            Fruit ices (not with fruit pulp)                                      Iced Popsicles                                    Carbonated beverages, regular and diet                                    Cranberry, grape and apple juices Sports drinks like Gatorade Lightly seasoned clear broth or consume(fat free) Sugar, honey syrup ___________________________________________________________________      BRUSH YOUR TEETH MORNING OF SURGERY AND RINSE YOUR MOUTH OUT, NO CHEWING GUM CANDY OR MINTS.     Take these medicines the morning of surgery with A SIP OF WATER:   metoprolol, synthroid, cardizem  DO NOT TAKE ANY DIABETIC  MEDICATIONS DAY OF YOUR SURGERY                               You may not have any metal on your body including hair pins and  piercings  Do not wear jewelry, make-up, lotions, powders or perfumes, deodorant             Do not wear nail polish on your fingernails.  Do not shave  48 hours prior to surgery.              Men may shave face and neck.   Do not bring valuables to the hospital. [place].  Contacts, dentures or bridgework may not be worn into surgery.  Leave suitcase in the car. After surgery it may be brought to your room.     Patients discharged the day of surgery will not be allowed to drive home. IF YOU ARE HAVING SURGERY AND GOING HOME THE SAME DAY, YOU MUST HAVE AN ADULT TO DRIVE YOU HOME AND BE WITH YOU FOR 24 HOURS. YOU MAY GO HOME BY TAXI OR UBER OR ORTHERWISE, BUT AN ADULT MUST ACCOMPANY YOU HOME AND STAY WITH YOU FOR 24 HOURS.  Name and phone number of your driver:  Special Instructions: N/A              Please read over the following fact sheets you were given: _____________________________________________________________________  [facility] - Preparing for Surgery Before surgery, you can play an important role.  Because skin is not sterile, your skin needs to be as free of germs as possible.  You can reduce the number of germs on your skin by washing with CHG (chlorahexidine gluconate) soap before surgery.  CHG is an antiseptic cleaner which kills germs and bonds with the skin to continue killing germs even after washing. Please DO NOT use if you have an allergy to CHG or antibacterial soaps.  If your skin becomes reddened/irritated stop using the CHG and inform your nurse when you arrive at Short Stay. Do not shave (including legs and underarms) for at least 48 hours prior to the first CHG shower.  You may shave your face/neck. Please follow these instructions carefully:  1.  Shower with CHG Soap the night  before surgery and the  morning of Surgery.  2.  If you choose to wash your hair, wash your hair first as usual with your  normal  shampoo.  3.  After you shampoo, rinse your hair and body thoroughly to remove the  shampoo.                           4.  Use CHG as you would any other liquid soap.  You can apply chg directly  to the skin and wash                       Gently with a scrungie or clean washcloth.  5.  Apply the CHG Soap to your body ONLY FROM THE NECK DOWN.   Do not use on face/ open                           Wound or open sores. Avoid contact with eyes, ears mouth and genitals (private parts).                       Wash face,  Genitals (private parts) with your normal soap.             6.  Wash thoroughly, paying special attention to the area where your surgery  will be performed.  7.  Thoroughly rinse your body with warm water from the neck down.  8.  DO NOT shower/wash with your normal soap after using and rinsing off  the CHG Soap.                9.  Pat yourself dry with a clean towel.            10.  Wear clean pajamas.            11.  Place clean sheets on your bed the night of your first shower and do not  sleep with pets. Day of Surgery : Do not apply any lotions/deodorants the morning of surgery.  Please wear clean clothes to the hospital/surgery center.  FAILURE TO FOLLOW THESE INSTRUCTIONS MAY RESULT IN THE CANCELLATION OF YOUR SURGERY PATIENT SIGNATURE_________________________________  NURSE SIGNATURE__________________________________  ________________________________________________________________________

## 2020-06-27 ENCOUNTER — Encounter (HOSPITAL_COMMUNITY)
Admission: RE | Admit: 2020-06-27 | Discharge: 2020-06-27 | Disposition: A | Discharge status: Home / Self Care | Payer: Commercial Managed Care - PPO | Source: Ambulatory Visit | Attending: General Surgery | Admitting: General Surgery

## 2020-06-27 ENCOUNTER — Other Ambulatory Visit: Payer: Self-pay

## 2020-06-27 ENCOUNTER — Encounter (HOSPITAL_COMMUNITY): Payer: Self-pay

## 2020-06-27 DIAGNOSIS — Z01818 Encounter for other preprocedural examination: Secondary | ICD-10-CM | POA: Diagnosis present

## 2020-06-27 HISTORY — DX: Presence of cardiac pacemaker: Z95.0

## 2020-06-27 HISTORY — DX: Cardiac murmur, unspecified: R01.1

## 2020-06-27 HISTORY — DX: Heart failure, unspecified: I50.9

## 2020-06-27 HISTORY — DX: Cardiac arrhythmia, unspecified: I49.9

## 2020-06-27 LAB — COMPREHENSIVE METABOLIC PANEL
ALT: 18 U/L (ref 0–44)
AST: 22 U/L (ref 15–41)
Albumin: 3.7 g/dL (ref 3.5–5.0)
Alkaline Phosphatase: 84 U/L (ref 38–126)
Anion gap: 6 (ref 5–15)
BUN: 21 mg/dL — ABNORMAL HIGH (ref 6–20)
CO2: 27 mmol/L (ref 22–32)
Calcium: 9 mg/dL (ref 8.9–10.3)
Chloride: 103 mmol/L (ref 98–111)
Creatinine, Ser: 0.84 mg/dL (ref 0.44–1.00)
GFR, Estimated: >60 mL/min (ref >60)
Glucose, Bld: 103 mg/dL — ABNORMAL HIGH (ref 70–99)
Potassium: 4.2 mmol/L (ref 3.5–5.1)
Sodium: 136 mmol/L (ref 135–145)
Total Bilirubin: 0.4 mg/dL (ref 0.3–1.2)
Total Protein: 7.9 g/dL (ref 6.5–8.1)

## 2020-06-27 LAB — CBC WITH DIFFERENTIAL/PLATELET
Abs Immature Granulocytes: 0.05 10*3/uL (ref 0.00–0.07)
Basophils Absolute: 0.1 10*3/uL (ref 0.0–0.1)
Basophils Relative: 1 %
Eosinophils Absolute: 0.2 10*3/uL (ref 0.0–0.5)
Eosinophils Relative: 2 %
HCT: 43.1 % (ref 36.0–46.0)
Hemoglobin: 14.4 g/dL (ref 12.0–15.0)
Immature Granulocytes: 1 %
Lymphocytes Relative: 27 %
Lymphs Abs: 2.9 10*3/uL (ref 0.7–4.0)
MCH: 29.7 pg (ref 26.0–34.0)
MCHC: 33.4 g/dL (ref 30.0–36.0)
MCV: 88.9 fL (ref 80.0–100.0)
Monocytes Absolute: 0.9 10*3/uL (ref 0.1–1.0)
Monocytes Relative: 8 %
Neutro Abs: 6.6 10*3/uL (ref 1.7–7.7)
Neutrophils Relative %: 61 %
Platelets: 274 10*3/uL (ref 150–400)
RBC: 4.85 MIL/uL (ref 3.87–5.11)
RDW: 13.2 % (ref 11.5–15.5)
WBC: 10.7 10*3/uL — ABNORMAL HIGH (ref 4.0–10.5)
nRBC: 0 % (ref 0.0–0.2)

## 2020-06-27 LAB — GLUCOSE, CAPILLARY: Glucose-Capillary: 113 mg/dL — ABNORMAL HIGH (ref 70–99)

## 2020-06-27 LAB — TYPE AND SCREEN
ABO/RH(D): AB POS
Antibody Screen: NEGATIVE

## 2020-06-27 NOTE — Progress Notes (Addendum)
Anesthesia Review:  PCP:  Jarrett Soho, - [facility]  Was Juluis Rainier LOV - Requested LOV note. 04/13/20 Lov note on chart along with 01/09/2020 OV note  Cardiologist : DR Croituri LOV 08/18/2019  Pacemaker Last Device check 06/14/20  Chest x-ray : 2V 11/18/19  EKG : 11/16/19  Echo : Stress test: Cardiac Cath :  Activity level: can doa  Flight of stairs without difficulty  Sleep Study/ CPAP : none  Fasting Blood Sugar :      / Checks Blood Sugar -- times a day:   Blood Thinner/ Instructions /Last Dose: ASA / Instructions/ Last Dose :  eliquis- pt to stop 2 days prior to surgery.  DM- type 2 on no meds- checks glucose once daily  hgba1c-06/27/20-

## 2020-06-28 LAB — HEMOGLOBIN A1C
Hgb A1c MFr Bld: 5.9 % — ABNORMAL HIGH (ref 4.8–5.6)
Mean Plasma Glucose: 123 mg/dL

## 2020-06-29 NOTE — Anesthesia Preprocedure Evaluation (Addendum)
Anesthesia Evaluation  Patient identified by MRN, date of birth, ID band Patient awake    Reviewed: Allergy & Precautions, NPO status , Patient's Chart, lab work & pertinent test results  History of Anesthesia Complications Negative for: history of anesthetic complications  Airway Mallampati: III  TM Distance: >3 FB Neck ROM: Full    Dental no notable dental hx. (+) Dental Advisory Given   Pulmonary neg pulmonary ROS,    Pulmonary exam normal        Cardiovascular hypertension, Pt. on medications and Pt. on home beta blockers +CHF  Normal cardiovascular exam+ dysrhythmias Atrial Fibrillation + pacemaker      Neuro/Psych negative neurological ROS     GI/Hepatic negative GI ROS, Neg liver ROS,   Endo/Other  diabetesMorbid obesity  Renal/GU negative Renal ROS     Musculoskeletal negative musculoskeletal ROS (+)   Abdominal   Peds  Hematology negative hematology ROS (+)   Anesthesia Other Findings   Reproductive/Obstetrics                           Anesthesia Physical Anesthesia Plan  ASA: III  Anesthesia Plan: General   Post-op Pain Management:    Induction: Intravenous  PONV Risk Score and Plan: Ondansetron, Dexamethasone, Midazolam and Scopolamine patch - Pre-op  Airway Management Planned: Oral ETT  Additional Equipment:   Intra-op Plan:   Post-operative Plan: Extubation in OR  Informed Consent: I have reviewed the patients History and Physical, chart, labs and discussed the procedure including the risks, benefits and alternatives for the proposed anesthesia with the patient or authorized representative who has indicated his/her understanding and acceptance.     Dental advisory given  Plan Discussed with: CRNA and Anesthesiologist  Anesthesia Plan Comments: (See PAT note 06/27/2020, Jodell Cipro, PA-C)       Anesthesia Quick Evaluation

## 2020-06-29 NOTE — Progress Notes (Addendum)
Anesthesia Chart Review   Case: [id number] Date/Time: 07/09/20 0700   Procedures:      LAPAROSCOPIC GASTRIC SLEEVE RESECTION (N/A )     UPPER GI ENDOSCOPY (N/A )   Anesthesia type: General   Pre-op diagnosis: morbid obesity   Location: [facility] ROOM 02 / [facility]   Surgeons: Kinsinger, De Blanch, MD      DISCUSSION:55 y.o. never smoker with h/o HTN, CHF, atrial flutter/A-fib (Xarelto), pacemaker in place (device orders in progress note 07/05/2020), DM II, morbid obesity scheduled for above procedure 07/09/2020 with Dr. Feliciana Rossetti.   Per cardiology preoperative evaluation 11/02/2019, "Chart reviewed as part of pre-operative protocol coverage. Patient was contacted 11/02/2019 in reference to pre-operative risk assessment for pending surgery as outlined below.  Priscilla [name] was last seen on 08/18/19 by Dr. Harrold Donath with history of atrial fib/flutter s/p prior ablation followed by atrial standstill and pacemaker implantation, morbid obesity, IDA, hypothyroidism, prior normal coronary angiography. Last echo 2016 showed normal LV function. In Dr. Erin Hearing last note he indicated he felt she was a good candidate for bariatric surgery. RCRI 0.4% indicating low risk of CV complications. I spoke with the patient today who affirms she's been doing well from cardiac standpoint without any new concerning symptoms. She does not exercise formally but with ADLs including carrying in groceries and routine walking she does not have any concerning symptoms. Therefore, based on ACC/AHA guidelines, the patient would be at acceptable risk for the planned procedure without further cardiovascular testing. The patient was advised that if she develops new symptoms prior to surgery to contact our office to arrange for a follow-up visit, and she verbalized understanding."  Pt advised to hold Xarelto 2 days prior to procedure.  VS: BP 116/74   Pulse 62   Temp 36.9 C (Oral)   Resp 16   Ht 5\' 2"  (1.575 m)   Wt 110.7 kg   SpO2 99%    BMI 44.63 kg/m   PROVIDERS: , PA-C is PCP   Croitoru, Jarrett Soho, MD is Cardiologist  LABS: Labs reviewed: Acceptable for surgery. (all labs ordered are listed, but only abnormal results are displayed)  Labs Reviewed  CBC WITH DIFFERENTIAL/PLATELET - Abnormal; Notable for the following components:      Result Value   WBC 10.7 (*)    All other components within normal limits  COMPREHENSIVE METABOLIC PANEL - Abnormal; Notable for the following components:   Glucose, Bld 103 (*)    BUN 21 (*)    All other components within normal limits  HEMOGLOBIN A1C - Abnormal; Notable for the following components:   Hgb A1c MFr Bld 5.9 (*)    All other components within normal limits  GLUCOSE, CAPILLARY - Abnormal; Notable for the following components:   Glucose-Capillary 113 (*)    All other components within normal limits  TYPE AND SCREEN     IMAGES:   EKG: 11/16/2019 Rate 62 bpm  Atrial-paced rhythm with prolonged AV conduction Left axis deviation Left ventricular hypertrophy with QRS widening ( R in aVL , Cornell product ) Abnormal ECG No significant change since last tracing  CV: Echo 08/02/2014 Study Conclusions   - Left ventricle: The cavity size was normal. Systolic function was  normal. The estimated ejection fraction was in the range of 55%  to 60%. Wall motion was normal; there were no regional wall  motion abnormalities. Left ventricular diastolic function  parameters were normal.  - Left atrium: The atrium was mildly dilated.  -  Right ventricle: The cavity size was mildly dilated. Wall  thickness was normal.  - Line: A venous catheter was visualized in the superior vena cava,  with its tip in the right atrium. No abnormal features noted.  Cardiac Cath 03/09/2014 1. No significant epicardial coronary artery disease identified.  2. Reduced left ventricular ejection fraction  3. Nonischemic cardiomyopathy  Past Medical History:  Diagnosis  Date  . Atrial fibrillation (HCC) 2015  . Atrial flutter (HCC) 2015   status post a flutter ablation and subsequent Medtronic pacemaker, battery 2015  . CHF (congestive heart failure) (HCC)   . Diabetes mellitus without complication (HCC)    type 2   . Dysrhythmia    atfib/at flutter   . Heart murmur   . History of colonoscopy 01/23/15  . History of mammogram 11/13/14  . Hypertension   . Hypothyroidism   . Obese   . Presence of permanent cardiac pacemaker   . Sick sinus syndrome (HCC) 08/01/2014   Dual chamber Medtronic advisa MRI conditional implanted February 2015    Past Surgical History:  Procedure Laterality Date  . ATRIAL FLUTTER ABLATION  2015   in [place]  . BLADDER SUSPENSION N/A 01/05/2015   Procedure: TRANSVAGINAL TAPE (TVT) PROCEDURE;  Surgeon: Osborn Coho, MD;  Location: [facility];  Service: Gynecology;  Laterality: N/A;  . CARDIAC CATHETERIZATION  03/2013  . CARDIOVERSION N/A 12/18/2015   Procedure: CARDIOVERSION;  Surgeon: Thurmon Fair, MD;  Location: [facility];  Service: Cardiovascular;  Laterality: N/A;  . CESAREAN SECTION     x3  . CYSTOSCOPY N/A 01/05/2015   Procedure: CYSTOSCOPY;  Surgeon: Osborn Coho, MD;  Location: [facility];  Service: Gynecology;  Laterality: N/A;  . PACEMAKER INSERTION  03/2013   Medtronic Advisa DR MRI implanted in [place] for sick sinus syndrome    MEDICATIONS: . Biotin 5000 MCG TABS  . BLACK ELDERBERRY PO  . diltiazem (CARDIZEM) 120 MG tablet  . ELIQUIS 5 MG TABS tablet  . levothyroxine (SYNTHROID, LEVOTHROID) 25 MCG tablet  . Melatonin 5 MG TABS  . metoprolol tartrate (LOPRESSOR) 50 MG tablet  . Vitamin D3 (VITAMIN D) 25 MCG tablet   No current facility-administered medications for this encounter.     Jodell Cipro, PA-C [facility] Pre-Surgical Testing [phone]

## 2020-07-02 ENCOUNTER — Other Ambulatory Visit: Payer: Self-pay | Admitting: Cardiovascular Disease

## 2020-07-05 ENCOUNTER — Other Ambulatory Visit (HOSPITAL_COMMUNITY)
Admission: RE | Admit: 2020-07-05 | Discharge: 2020-07-05 | Disposition: A | Discharge status: Home / Self Care | Payer: Commercial Managed Care - PPO | Source: Ambulatory Visit | Attending: General Surgery | Admitting: General Surgery

## 2020-07-05 ENCOUNTER — Encounter: Payer: Self-pay | Admitting: Cardiovascular Disease

## 2020-07-05 DIAGNOSIS — Z20822 Contact with and (suspected) exposure to covid-19: Secondary | ICD-10-CM | POA: Diagnosis not present

## 2020-07-05 DIAGNOSIS — Z01812 Encounter for preprocedural laboratory examination: Secondary | ICD-10-CM | POA: Insufficient documentation

## 2020-07-05 LAB — SARS CORONAVIRUS 2 (TAT 6-24 HRS): SARS Coronavirus 2: NEGATIVE

## 2020-07-05 NOTE — Progress Notes (Signed)
PERIOPERATIVE PRESCRIPTION FOR IMPLANTED CARDIAC DEVICE PROGRAMMING   Patient Information: Name:[name], Priscilla Bickle  DOB: [birth year]  MRN: [id number]  Planned Procedure: laparoscopic sleeve gastrectomy, upper endoscopy  Surgeon: Dr. Feliciana Rossetti  Date of Procedure: 07/09/2020  Cautery will be used.  Position during surgery: unknown   Please send documentation back to:  [facility] (Fax # [phone])   Jodell Cipro, PA-C  06/29/2020 3:22 PM     Device Information:   Clinic EP Physician:  Thurmon Fair Device Type:  Pacemaker Manufacturer and Phone #:  Medtronic: [phone] Pacemaker Dependent?:  No Date of Last Device Check:  06/14/2020        Normal Device Function?:  Yes     Electrophysiologist's Recommendations:    Have magnet available.  Provide continuous ECG monitoring when magnet is used or reprogramming is to be performed.   Procedure may interfere with device function.  Magnet should be placed over device during procedure.  Per Device Clinic Standing Orders, Uvaldo Rising  07/05/2020 9:41 AM

## 2020-07-08 MED ORDER — BUPIVACAINE LIPOSOME 1.3 % IJ SUSP
20.0000 mL | Freq: Once | INTRAMUSCULAR | Status: DC
  Filled 2020-07-08: qty 20

## 2020-07-09 ENCOUNTER — Inpatient Hospital Stay (HOSPITAL_COMMUNITY)
Admission: RE | Admit: 2020-07-09 | Discharge: 2020-07-10 | DRG: 620 | Disposition: A | Discharge status: Home / Self Care | Payer: Commercial Managed Care - PPO | Attending: General Surgery | Admitting: General Surgery

## 2020-07-09 ENCOUNTER — Other Ambulatory Visit: Payer: Self-pay

## 2020-07-09 ENCOUNTER — Inpatient Hospital Stay (HOSPITAL_COMMUNITY): Payer: Commercial Managed Care - PPO | Admitting: Certified Registered Nurse Anesthetist

## 2020-07-09 ENCOUNTER — Encounter (HOSPITAL_COMMUNITY)
Admission: RE | Disposition: A | Discharge status: Home / Self Care | Payer: Self-pay | Source: Home / Self Care | Attending: General Surgery

## 2020-07-09 ENCOUNTER — Inpatient Hospital Stay (HOSPITAL_COMMUNITY): Payer: Commercial Managed Care - PPO | Admitting: Physician Assistant

## 2020-07-09 ENCOUNTER — Encounter (HOSPITAL_COMMUNITY): Payer: Self-pay | Admitting: General Surgery

## 2020-07-09 DIAGNOSIS — I509 Heart failure, unspecified: Secondary | ICD-10-CM | POA: Diagnosis present

## 2020-07-09 DIAGNOSIS — Z79890 Hormone replacement therapy (postmenopausal): Secondary | ICD-10-CM | POA: Diagnosis not present

## 2020-07-09 DIAGNOSIS — I495 Sick sinus syndrome: Secondary | ICD-10-CM | POA: Diagnosis present

## 2020-07-09 DIAGNOSIS — Z7901 Long term (current) use of anticoagulants: Secondary | ICD-10-CM

## 2020-07-09 DIAGNOSIS — E039 Hypothyroidism, unspecified: Secondary | ICD-10-CM | POA: Diagnosis present

## 2020-07-09 DIAGNOSIS — E119 Type 2 diabetes mellitus without complications: Secondary | ICD-10-CM | POA: Diagnosis present

## 2020-07-09 DIAGNOSIS — I480 Paroxysmal atrial fibrillation: Secondary | ICD-10-CM | POA: Diagnosis present

## 2020-07-09 DIAGNOSIS — I4891 Unspecified atrial fibrillation: Secondary | ICD-10-CM | POA: Diagnosis present

## 2020-07-09 DIAGNOSIS — Z79899 Other long term (current) drug therapy: Secondary | ICD-10-CM | POA: Diagnosis not present

## 2020-07-09 DIAGNOSIS — Z8249 Family history of ischemic heart disease and other diseases of the circulatory system: Secondary | ICD-10-CM | POA: Diagnosis not present

## 2020-07-09 DIAGNOSIS — I4892 Unspecified atrial flutter: Secondary | ICD-10-CM | POA: Diagnosis present

## 2020-07-09 DIAGNOSIS — Z950 Presence of cardiac pacemaker: Secondary | ICD-10-CM

## 2020-07-09 DIAGNOSIS — E669 Obesity, unspecified: Secondary | ICD-10-CM | POA: Diagnosis present

## 2020-07-09 DIAGNOSIS — I110 Hypertensive heart disease with heart failure: Secondary | ICD-10-CM | POA: Diagnosis present

## 2020-07-09 DIAGNOSIS — Z6841 Body Mass Index (BMI) 40.0 and over, adult: Secondary | ICD-10-CM | POA: Diagnosis not present

## 2020-07-09 HISTORY — PX: LAPAROSCOPIC GASTRIC SLEEVE RESECTION: SHX5895

## 2020-07-09 HISTORY — PX: UPPER GI ENDOSCOPY: SHX6162

## 2020-07-09 LAB — GLUCOSE, CAPILLARY
Glucose-Capillary: 126 mg/dL — ABNORMAL HIGH (ref 70–99)
Glucose-Capillary: 135 mg/dL — ABNORMAL HIGH (ref 70–99)
Glucose-Capillary: 141 mg/dL — ABNORMAL HIGH (ref 70–99)
Glucose-Capillary: 167 mg/dL — ABNORMAL HIGH (ref 70–99)
Glucose-Capillary: 167 mg/dL — ABNORMAL HIGH (ref 70–99)

## 2020-07-09 LAB — HEMOGLOBIN AND HEMATOCRIT, BLOOD
HCT: 42.5 % (ref 36.0–46.0)
Hemoglobin: 13.8 g/dL (ref 12.0–15.0)

## 2020-07-09 LAB — ABO/RH: ABO/RH(D): AB POS

## 2020-07-09 LAB — HCG, SERUM, QUALITATIVE: Preg, Serum: NEGATIVE

## 2020-07-09 SURGERY — GASTRECTOMY, SLEEVE, LAPAROSCOPIC
Anesthesia: General

## 2020-07-09 MED ORDER — ORAL CARE MOUTH RINSE: 15.0000 mL | Freq: Once | OROMUCOSAL | Status: AC

## 2020-07-09 MED ORDER — DEXAMETHASONE SODIUM PHOSPHATE 10 MG/ML IJ SOLN
INTRAMUSCULAR | Status: AC
  Filled 2020-07-09: qty 1

## 2020-07-09 MED ORDER — FENTANYL CITRATE (PF) 250 MCG/5ML IJ SOLN
INTRAMUSCULAR | Status: AC
  Filled 2020-07-09: qty 5

## 2020-07-09 MED ORDER — SUGAMMADEX SODIUM 500 MG/5ML IV SOLN
INTRAVENOUS | Status: AC
  Filled 2020-07-09: qty 5

## 2020-07-09 MED ORDER — APREPITANT 40 MG PO CAPS
40.0000 mg | ORAL_CAPSULE | ORAL | Status: AC
  Administered 2020-07-09: 40 mg via ORAL
  Filled 2020-07-09: qty 1

## 2020-07-09 MED ORDER — LIDOCAINE HCL 2 % IJ SOLN
INTRAMUSCULAR | Status: AC
  Filled 2020-07-09: qty 20

## 2020-07-09 MED ORDER — KETAMINE HCL 10 MG/ML IJ SOLN
INTRAMUSCULAR | Status: AC
  Filled 2020-07-09: qty 1

## 2020-07-09 MED ORDER — FENTANYL CITRATE (PF) 100 MCG/2ML IJ SOLN
INTRAMUSCULAR | Status: AC
  Administered 2020-07-09: 50 ug via INTRAVENOUS
  Filled 2020-07-09: qty 2

## 2020-07-09 MED ORDER — SCOPOLAMINE 1 MG/3DAYS TD PT72: 1.0000 | MEDICATED_PATCH | TRANSDERMAL | Status: DC

## 2020-07-09 MED ORDER — ACETAMINOPHEN 500 MG PO TABS
1000.0000 mg | ORAL_TABLET | Freq: Once | ORAL | Status: DC
  Filled 2020-07-09: qty 2

## 2020-07-09 MED ORDER — ACETAMINOPHEN 500 MG PO TABS
1000.0000 mg | ORAL_TABLET | ORAL | Status: AC
  Administered 2020-07-09: 1000 mg via ORAL

## 2020-07-09 MED ORDER — CHLORHEXIDINE GLUCONATE 0.12 % MT SOLN
15.0000 mL | Freq: Once | OROMUCOSAL | Status: AC
  Administered 2020-07-09: 15 mL via OROMUCOSAL

## 2020-07-09 MED ORDER — AMISULPRIDE (ANTIEMETIC) 5 MG/2ML IV SOLN
INTRAVENOUS | Status: AC
  Filled 2020-07-09: qty 2

## 2020-07-09 MED ORDER — SODIUM CHLORIDE 0.9 % IV SOLN
2.0000 g | INTRAVENOUS | Status: AC
  Administered 2020-07-09: 2 g via INTRAVENOUS
  Filled 2020-07-09: qty 2

## 2020-07-09 MED ORDER — METOPROLOL TARTRATE 25 MG PO TABS
25.0000 mg | ORAL_TABLET | Freq: Two times a day (BID) | ORAL | Status: DC
  Administered 2020-07-09 – 2020-07-10 (×2): 25 mg via ORAL
  Filled 2020-07-09 (×2): qty 1

## 2020-07-09 MED ORDER — MIDAZOLAM HCL 5 MG/5ML IJ SOLN
INTRAMUSCULAR | Status: DC | PRN
  Administered 2020-07-09: 2 mg via INTRAVENOUS

## 2020-07-09 MED ORDER — ROCURONIUM BROMIDE 10 MG/ML (PF) SYRINGE
PREFILLED_SYRINGE | INTRAVENOUS | Status: DC | PRN
  Administered 2020-07-09: 100 mg via INTRAVENOUS

## 2020-07-09 MED ORDER — MIDAZOLAM HCL 2 MG/2ML IJ SOLN
INTRAMUSCULAR | Status: AC
  Filled 2020-07-09: qty 2

## 2020-07-09 MED ORDER — KETAMINE HCL 10 MG/ML IJ SOLN
INTRAMUSCULAR | Status: DC | PRN
  Administered 2020-07-09: 30 mg via INTRAVENOUS

## 2020-07-09 MED ORDER — HYDRALAZINE HCL 25 MG PO TABS: 25.0000 mg | ORAL_TABLET | Freq: Three times a day (TID) | ORAL | Status: DC | PRN

## 2020-07-09 MED ORDER — SUGAMMADEX SODIUM 200 MG/2ML IV SOLN
INTRAVENOUS | Status: DC | PRN
  Administered 2020-07-09: 400 mg via INTRAVENOUS

## 2020-07-09 MED ORDER — CHLORHEXIDINE GLUCONATE CLOTH 2 % EX PADS: 6.0000 | MEDICATED_PAD | Freq: Once | CUTANEOUS | Status: DC

## 2020-07-09 MED ORDER — SCOPOLAMINE 1 MG/3DAYS TD PT72
1.0000 | MEDICATED_PATCH | TRANSDERMAL | Status: DC
  Administered 2020-07-09: 1.5 mg via TRANSDERMAL
  Filled 2020-07-09: qty 1

## 2020-07-09 MED ORDER — MELATONIN 5 MG PO TABS: 5.0000 mg | ORAL_TABLET | Freq: Every evening | ORAL | Status: DC | PRN

## 2020-07-09 MED ORDER — FAMOTIDINE IN NACL 20-0.9 MG/50ML-% IV SOLN
20.0000 mg | Freq: Two times a day (BID) | INTRAVENOUS | Status: DC
  Administered 2020-07-09 – 2020-07-10 (×3): 20 mg via INTRAVENOUS
  Filled 2020-07-09 (×3): qty 50

## 2020-07-09 MED ORDER — LEVOTHYROXINE SODIUM 25 MCG PO TABS
25.0000 ug | ORAL_TABLET | Freq: Every day | ORAL | Status: DC
  Administered 2020-07-10: 25 ug via ORAL
  Filled 2020-07-09: qty 1

## 2020-07-09 MED ORDER — HEPARIN SODIUM (PORCINE) 5000 UNIT/ML IJ SOLN
5000.0000 [IU] | INTRAMUSCULAR | Status: AC
  Administered 2020-07-09: 5000 [IU] via SUBCUTANEOUS
  Filled 2020-07-09: qty 1

## 2020-07-09 MED ORDER — LIDOCAINE 2% (20 MG/ML) 5 ML SYRINGE
INTRAMUSCULAR | Status: DC | PRN
  Administered 2020-07-09: 1.5 mg/kg/h via INTRAVENOUS

## 2020-07-09 MED ORDER — LACTATED RINGERS IV SOLN: INTRAVENOUS | Status: DC

## 2020-07-09 MED ORDER — ENSURE MAX PROTEIN PO LIQD
2.0000 [oz_av] | ORAL | Status: DC
  Administered 2020-07-10: 2 [oz_av] via ORAL

## 2020-07-09 MED ORDER — ONDANSETRON HCL 4 MG/2ML IJ SOLN
INTRAMUSCULAR | Status: AC
  Filled 2020-07-09: qty 2

## 2020-07-09 MED ORDER — BUPIVACAINE HCL 0.25 % IJ SOLN
INTRAMUSCULAR | Status: AC
  Filled 2020-07-09: qty 1

## 2020-07-09 MED ORDER — ENOXAPARIN SODIUM 30 MG/0.3ML IJ SOSY
30.0000 mg | PREFILLED_SYRINGE | Freq: Two times a day (BID) | INTRAMUSCULAR | Status: DC
  Administered 2020-07-09 – 2020-07-10 (×2): 30 mg via SUBCUTANEOUS
  Filled 2020-07-09 (×2): qty 0.3

## 2020-07-09 MED ORDER — ONDANSETRON HCL 4 MG/2ML IJ SOLN
4.0000 mg | INTRAMUSCULAR | Status: DC | PRN
  Administered 2020-07-09: 4 mg via INTRAVENOUS

## 2020-07-09 MED ORDER — DILTIAZEM HCL 90 MG PO TABS: 360.0000 mg | ORAL_TABLET | Freq: Every day | ORAL | Status: DC

## 2020-07-09 MED ORDER — FENTANYL CITRATE (PF) 100 MCG/2ML IJ SOLN
25.0000 ug | INTRAMUSCULAR | Status: DC | PRN
  Administered 2020-07-09: 50 ug via INTRAVENOUS

## 2020-07-09 MED ORDER — PHENYLEPHRINE HCL-NACL 10-0.9 MG/250ML-% IV SOLN
INTRAVENOUS | Status: DC | PRN
  Administered 2020-07-09: 35 ug/min via INTRAVENOUS

## 2020-07-09 MED ORDER — GABAPENTIN 300 MG PO CAPS
300.0000 mg | ORAL_CAPSULE | ORAL | Status: AC
  Administered 2020-07-09: 300 mg via ORAL
  Filled 2020-07-09: qty 1

## 2020-07-09 MED ORDER — DILTIAZEM HCL 60 MG PO TABS
120.0000 mg | ORAL_TABLET | Freq: Three times a day (TID) | ORAL | Status: DC
  Administered 2020-07-09 – 2020-07-10 (×3): 120 mg via ORAL
  Filled 2020-07-09 (×4): qty 2

## 2020-07-09 MED ORDER — ROCURONIUM BROMIDE 10 MG/ML (PF) SYRINGE
PREFILLED_SYRINGE | INTRAVENOUS | Status: AC
  Filled 2020-07-09: qty 10

## 2020-07-09 MED ORDER — INSULIN ASPART 100 UNIT/ML IJ SOLN
0.0000 [IU] | INTRAMUSCULAR | Status: DC
  Administered 2020-07-09: 3 [IU] via SUBCUTANEOUS
  Administered 2020-07-09 (×2): 4 [IU] via SUBCUTANEOUS
  Administered 2020-07-10: 3 [IU] via SUBCUTANEOUS

## 2020-07-09 MED ORDER — BUPIVACAINE LIPOSOME 1.3 % IJ SUSP
INTRAMUSCULAR | Status: DC | PRN
  Administered 2020-07-09: 20 mL

## 2020-07-09 MED ORDER — BUPIVACAINE HCL 0.25 % IJ SOLN
INTRAMUSCULAR | Status: DC | PRN
  Administered 2020-07-09: 30 mL

## 2020-07-09 MED ORDER — SIMETHICONE 80 MG PO CHEW
80.0000 mg | CHEWABLE_TABLET | Freq: Four times a day (QID) | ORAL | Status: DC | PRN
  Filled 2020-07-09: qty 1

## 2020-07-09 MED ORDER — OXYCODONE HCL 5 MG/5ML PO SOLN
ORAL | Status: AC
  Filled 2020-07-09: qty 5

## 2020-07-09 MED ORDER — DEXAMETHASONE SODIUM PHOSPHATE 4 MG/ML IJ SOLN: 4.0000 mg | INTRAMUSCULAR | Status: DC

## 2020-07-09 MED ORDER — DEXTROSE-NACL 5-0.45 % IV SOLN
INTRAVENOUS | Status: DC
  Filled 2020-07-09 (×3): qty 1000

## 2020-07-09 MED ORDER — DEXAMETHASONE SODIUM PHOSPHATE 10 MG/ML IJ SOLN
INTRAMUSCULAR | Status: DC | PRN
  Administered 2020-07-09: 5 mg via INTRAVENOUS

## 2020-07-09 MED ORDER — LIDOCAINE 2% (20 MG/ML) 5 ML SYRINGE
INTRAMUSCULAR | Status: DC | PRN
  Administered 2020-07-09: 100 mg via INTRAVENOUS

## 2020-07-09 MED ORDER — LIDOCAINE HCL (PF) 2 % IJ SOLN
INTRAMUSCULAR | Status: AC
  Filled 2020-07-09: qty 5

## 2020-07-09 MED ORDER — ACETAMINOPHEN 160 MG/5ML PO SOLN: 1000.0000 mg | Freq: Three times a day (TID) | ORAL | Status: DC

## 2020-07-09 MED ORDER — OXYCODONE HCL 5 MG/5ML PO SOLN
5.0000 mg | Freq: Four times a day (QID) | ORAL | Status: DC | PRN
  Administered 2020-07-09: 5 mg via ORAL

## 2020-07-09 MED ORDER — ACETAMINOPHEN 500 MG PO TABS
1000.0000 mg | ORAL_TABLET | Freq: Three times a day (TID) | ORAL | Status: DC
  Administered 2020-07-09 – 2020-07-10 (×3): 1000 mg via ORAL
  Filled 2020-07-09 (×3): qty 2

## 2020-07-09 MED ORDER — PROPOFOL 10 MG/ML IV BOLUS
INTRAVENOUS | Status: AC
  Filled 2020-07-09: qty 20

## 2020-07-09 MED ORDER — LACTATED RINGERS IR SOLN
Status: DC | PRN
  Administered 2020-07-09: 1

## 2020-07-09 MED ORDER — PROPOFOL 10 MG/ML IV BOLUS
INTRAVENOUS | Status: DC | PRN
  Administered 2020-07-09: 180 mg via INTRAVENOUS

## 2020-07-09 MED ORDER — AMISULPRIDE (ANTIEMETIC) 5 MG/2ML IV SOLN
10.0000 mg | Freq: Once | INTRAVENOUS | Status: AC | PRN
  Administered 2020-07-09: 10 mg via INTRAVENOUS

## 2020-07-09 MED ORDER — MORPHINE SULFATE (PF) 2 MG/ML IV SOLN: 1.0000 mg | INTRAVENOUS | Status: DC | PRN

## 2020-07-09 MED ORDER — FENTANYL CITRATE (PF) 250 MCG/5ML IJ SOLN
INTRAMUSCULAR | Status: DC | PRN
  Administered 2020-07-09 (×2): 50 ug via INTRAVENOUS
  Administered 2020-07-09: 100 ug via INTRAVENOUS
  Administered 2020-07-09: 50 ug via INTRAVENOUS

## 2020-07-09 MED ORDER — ONDANSETRON HCL 4 MG/2ML IJ SOLN
INTRAMUSCULAR | Status: DC | PRN
  Administered 2020-07-09: 4 mg via INTRAVENOUS

## 2020-07-09 MED ORDER — CELECOXIB 200 MG PO CAPS
200.0000 mg | ORAL_CAPSULE | Freq: Once | ORAL | Status: AC
  Administered 2020-07-09: 200 mg via ORAL
  Filled 2020-07-09: qty 1

## 2020-07-09 SURGICAL SUPPLY — 54 items
APPLIER CLIP ROT 13.4 12 LRG (CLIP)
BAG LAPAROSCOPIC 12 15 PORT 16 (BASKET) ×1 IMPLANT
BAG RETRIEVAL 12/15 (BASKET) ×2
BENZOIN TINCTURE PRP APPL 2/3 (GAUZE/BANDAGES/DRESSINGS) ×2 IMPLANT
BLADE SURG SZ11 CARB STEEL (BLADE) ×2 IMPLANT
BNDG ADH 1X3 SHEER STRL LF (GAUZE/BANDAGES/DRESSINGS) ×2 IMPLANT
CABLE HIGH FREQUENCY MONO STRZ (ELECTRODE) IMPLANT
CHLORAPREP W/TINT 26 (MISCELLANEOUS) ×2 IMPLANT
CLIP APPLIE ROT 13.4 12 LRG (CLIP) IMPLANT
COVER SURGICAL LIGHT HANDLE (MISCELLANEOUS) ×2 IMPLANT
COVER WAND RF STERILE (DRAPES) IMPLANT
DECANTER SPIKE VIAL GLASS SM (MISCELLANEOUS) ×2 IMPLANT
DRAPE UTILITY XL STRL (DRAPES) ×4 IMPLANT
ELECT REM PT RETURN 15FT ADLT (MISCELLANEOUS) ×2 IMPLANT
GAUZE 4X4 16PLY RFD (DISPOSABLE) ×2 IMPLANT
GLOVE SURG POLYISO LF SZ7 (GLOVE) ×2 IMPLANT
GLOVE SURG UNDER POLY LF SZ7 (GLOVE) ×2 IMPLANT
GOWN STRL REUS W/TWL LRG LVL3 (GOWN DISPOSABLE) ×2 IMPLANT
GOWN STRL REUS W/TWL XL LVL3 (GOWN DISPOSABLE) ×6 IMPLANT
GRASPER SUT TROCAR 14GX15 (MISCELLANEOUS) ×2 IMPLANT
KIT BASIN OR (CUSTOM PROCEDURE TRAY) ×2 IMPLANT
KIT TURNOVER KIT A (KITS) ×2 IMPLANT
MARKER SKIN DUAL TIP RULER LAB (MISCELLANEOUS) ×2 IMPLANT
MAT PREVALON FULL STRYKER (MISCELLANEOUS) IMPLANT
NEEDLE SPNL 22GX3.5 QUINCKE BK (NEEDLE) ×2 IMPLANT
PACK UNIVERSAL I (CUSTOM PROCEDURE TRAY) ×2 IMPLANT
RELOAD STAPLER BLUE 60MM (STAPLE) ×3 IMPLANT
RELOAD STAPLER GOLD 60MM (STAPLE) ×1 IMPLANT
RELOAD STAPLER GREEN 60MM (STAPLE) ×1 IMPLANT
SCISSORS LAP 5X45 EPIX DISP (ENDOMECHANICALS) IMPLANT
SET IRRIG TUBING LAPAROSCOPIC (IRRIGATION / IRRIGATOR) ×2 IMPLANT
SET TUBE SMOKE EVAC HIGH FLOW (TUBING) ×2 IMPLANT
SHEARS HARMONIC ACE PLUS 45CM (MISCELLANEOUS) ×2 IMPLANT
SLEEVE GASTRECTOMY 40FR VISIGI (MISCELLANEOUS) ×2 IMPLANT
SLEEVE XCEL OPT CAN 5 100 (ENDOMECHANICALS) ×4 IMPLANT
SOL ANTI FOG 6CC (MISCELLANEOUS) ×1 IMPLANT
SOLUTION ANTI FOG 6CC (MISCELLANEOUS) ×1
STAPLER ECHELON LONG 60 440 (INSTRUMENTS) ×2 IMPLANT
STAPLER RELOAD BLUE 60MM (STAPLE) ×6
STAPLER RELOAD GOLD 60MM (STAPLE) ×2
STAPLER RELOAD GREEN 60MM (STAPLE) ×2
STRIP CLOSURE SKIN 1/2X4 (GAUZE/BANDAGES/DRESSINGS) ×2 IMPLANT
SUT ETHIBOND 0 36 GRN (SUTURE) IMPLANT
SUT MNCRL AB 4-0 PS2 18 (SUTURE) ×2 IMPLANT
SUT VICRYL 0 TIES 12 18 (SUTURE) ×2 IMPLANT
SYR 20ML LL LF (SYRINGE) ×2 IMPLANT
SYR 50ML LL SCALE MARK (SYRINGE) ×2 IMPLANT
TAPE STRIPS DRAPE STRL (GAUZE/BANDAGES/DRESSINGS) ×2 IMPLANT
TOWEL OR 17X26 10 PK STRL BLUE (TOWEL DISPOSABLE) ×2 IMPLANT
TOWEL OR NON WOVEN STRL DISP B (DISPOSABLE) ×2 IMPLANT
TROCAR BLADELESS 15MM (ENDOMECHANICALS) ×2 IMPLANT
TROCAR BLADELESS OPT 5 100 (ENDOMECHANICALS) ×2 IMPLANT
TUBING CONNECTING 10 (TUBING) ×4 IMPLANT
WATER STERILE IRR 500ML POUR (IV SOLUTION) ×2 IMPLANT

## 2020-07-09 NOTE — Progress Notes (Signed)
Water started at 1330.

## 2020-07-09 NOTE — Progress Notes (Signed)
Remote pacemaker transmission.   

## 2020-07-09 NOTE — Op Note (Signed)
Preop Diagnosis: Obesity Class III  Postop Diagnosis: same  Procedure performed: laparoscopic Sleeve Gastrectomy  Assitant: Ivar Drape  Indications:  The patient is a 55 y.o. year-old morbidly obese female who has been followed in the Bariatric Clinic as an outpatient. This patient was diagnosed with morbid obesity with a BMI of Body mass index is 44.7 kg/m. and significant co-morbidities including hypertension and non-insulin dependent diabetes.  The patient was counseled extensively in the Bariatric Outpatient Clinic and after a thorough explanation of the risks and benefits of surgery (including death from complications, bowel leak, infection such as peritonitis and/or sepsis, internal hernia, bleeding, need for blood transfusion, bowel obstruction, organ failure, pulmonary embolus, deep venous thrombosis, wound infection, incisional hernia, skin breakdown, and others entailed on the consent form) and after a compliant diet and exercise program, the patient was scheduled for an elective laparoscopic sleeve gastrectomy.  Description of Operation:  Following informed consent, the patient was taken to the operating room and placed on the operating table in the supine position.  She had previously received prophylactic antibiotics and subcutaneous heparin for DVT prophylaxis in the pre-op holding area.  After induction of general endotracheal anesthesia by the anesthesiologist, the patient underwent placement of sequential compression devices and an oro-gastric tube.  A timeout was confirmed by the surgery and anesthesia teams.  The patient was adequately padded at all pressure points and placed on a footboard to prevent slippage from the OR table during extremes of position during surgery.  She underwent a routine sterile prep and drape of her entire abdomen.    Next, A transverse incision was made under the left subcostal area and a 32mm optical viewing trocar was introduced into the peritoneal  cavity. Pneumoperitoneum was applied with a high flow and low pressure. A laparoscope was inserted to confirm placement. A extraperitoneal block was then placed at the lateral abdominal wall using exparel diluted with marcaine. 5 additional incisions were placed: 1 58mm trocar to the left of the midline. 1 additional 75mm trocar in the left lateral area, 1 89mm trocar in the right mid abdomen, 1 2mm trocar in the right subcostal area, and a Nathanson retractor was placed through a subxiphoid incision.  Next, a hole was created through the lesser omentum along the greater curve of the stomach to enter the lesser sac. The vessels along the greater omentum were  Then ligated and divided using the Harmonic scalpel moving towards the spleen and then short gastric vessels were ligated and divided in the same fashion to fully mobilize the fundus. The left crus was identified to ensure completion of the dissection. Next the antrum was measured and dissection continued inferiorly along the greater curve towards the pylorus and stopped 6cm from the pylorus.   A 40Fr ViSiGi dilator was placed into the esophgaus and along the lesser curve of the stomach and placed on suction. 1 non-reinforced 72mm Green load echelon stapler(s) followed by 1 32mm Gold load echelon stapler(s) followed by 3 75mm blue load echelon stapler(s) were used to make the resection along the antrum being sure to stay well away from the angularis by angling the jaws of the stapler towards the greater curve and later completing the resection staying along the ViSiGi and ensuring the fundus was not retained by appropriately retracting it lateral. Air was inserted through the ViSiGi to perform a leak test showing no bubbles and a neutral lie of the stomach.  The assistant then went and performed an upper endoscopy and leak  test. No bubbles were seen and the sleeve and antrum distended appropriately. The specimen was then placed in an endocatch bag and  removed by the 49mm port. The fascia of the 53mm port was closed with a 0 vicryl by suture passer. Hemostasis was ensured. Pneumoperitoneum was evacuated, all ports were removed and all incisions closed with 4-0 monocryl suture in subcuticular fashion. Steristrips and bandaids were put in place for dressing. The patient awoke from anesthesia and was brought to pacu in stable condition. All counts were correct.  Estimated blood loss: <61ml  Specimens:  Sleeve gastrectomy  Local Anesthesia: 50 ml Exparel:0.5% Marcaine mix  Post-Op Plan:       Pain Management: PO, prn      Antibiotics: Prophylactic      Anticoagulation: Prophylactic, Starting now      Post Op Studies/Consults: Not applicable      Intended Discharge: within 48h      Intended Outpatient Follow-Up: Two Week      Intended Outpatient Studies: Not Applicable      Other: Not Applicable  Images:   [place]anch [place]

## 2020-07-09 NOTE — Progress Notes (Signed)
Patient c/o nausea Anesthesia ordered Barhyphsis rescue antiemetic due to hx PONV,did not want alternative antiemitic.

## 2020-07-09 NOTE — Anesthesia Postprocedure Evaluation (Signed)
Anesthesia Post Note  Patient: Priscilla [name]  Procedure(s) Performed: LAPAROSCOPIC GASTRIC SLEEVE RESECTION (N/A ) UPPER GI ENDOSCOPY (N/A )     Patient location during evaluation: PACU Anesthesia Type: General Level of consciousness: sedated Pain management: pain level controlled Vital Signs Assessment: post-procedure vital signs reviewed and stable Respiratory status: spontaneous breathing and respiratory function stable Cardiovascular status: stable Postop Assessment: no apparent nausea or vomiting Anesthetic complications: no   No complications documented.  Last Vitals:  Vitals:   07/09/20 0930 07/09/20 0945  BP: (!) 174/87 (!) 176/84  Pulse: (!) 59 (!) 59  Resp: 12 14  Temp:  (!) 36.4 C  SpO2: 100% 100%    Last Pain:  Vitals:   07/09/20 0945  TempSrc:   PainSc: 5                  Kamiyah Kindel DANIEL

## 2020-07-09 NOTE — Anesthesia Procedure Notes (Signed)
Procedure Name: Intubation Date/Time: 07/09/2020 7:26 AM Performed by: Maxwell Caul, CRNA Pre-anesthesia Checklist: Patient identified, Emergency Drugs available, Suction available and Patient being monitored Patient Re-evaluated:Patient Re-evaluated prior to induction Oxygen Delivery Method: Circle system utilized Preoxygenation: Pre-oxygenation with 100% oxygen Induction Type: IV induction Ventilation: Mask ventilation without difficulty Laryngoscope Size: Mac and 4 Grade View: Grade I Tube type: Oral Tube size: 7.0 mm Number of attempts: 1 Airway Equipment and Method: Stylet Placement Confirmation: ETT inserted through vocal cords under direct vision,  positive ETCO2 and breath sounds checked- equal and bilateral Secured at: 21 cm Tube secured with: Tape Dental Injury: Teeth and Oropharynx as per pre-operative assessment

## 2020-07-09 NOTE — Progress Notes (Signed)
PHARMACY CONSULT FOR:  Risk Assessment for Post-Discharge VTE Following Bariatric Surgery  Post-Discharge VTE Risk Assessment: This patient's probability of 30-day post-discharge VTE is increased due to the factors marked:   Female    Age >/=60 years    BMI >/=50 kg/m2  X  CHF    Dyspnea at Rest    Paraplegia    Non-gastric-band surgery    Operation Time >/=3 hr    Return to OR     Length of Stay >/= 3 d   Hx of VTE   Hypercoagulable condition   Significant venous stasis    Predicted probability of 30-day post-discharge VTE: 1.03%  Other patient-specific factors to consider:  On Eliquis 5mg  PO BID prior to admit for Afib  Recommendation for Discharge: Resume full-dose anticoagulation as soon as medically safe per surgery MD assessment.    Given limited published data on reliability of DOAC absorption in this setting, will await MD decision on whether to use enoxaparin vs resume DOAC.  Priscilla [name] is a 55 y.o. female who underwent  Lap gastric sleeve on 6/6   Case start: 0749 Case end: 0837  No Known Allergies  Patient Measurements: Height: 5\' 2"  (157.5 cm) Weight: 110.9 kg (244 lb 6.4 oz) IBW/kg (Calculated) : 50.1 Body mass index is 44.7 kg/m.  Recent Labs    07/09/20 0848  HGB 13.8  HCT 42.5   Estimated Creatinine Clearance: 87.8 mL/min (by C-G formula based on SCr of 0.84 mg/dL).    Past Medical History:  Diagnosis Date  . Atrial fibrillation (HCC) 2015  . Atrial flutter (HCC) 2015   status post a flutter ablation and subsequent Medtronic pacemaker, battery 2015  . CHF (congestive heart failure) (HCC)   . Diabetes mellitus without complication (HCC)    type 2   . Dysrhythmia    atfib/at flutter   . Heart murmur   . History of colonoscopy 01/23/15  . History of mammogram 11/13/14  . Hypertension   . Hypothyroidism   . Obese   . Presence of permanent cardiac pacemaker   . Sick sinus syndrome (HCC) 08/01/2014   Dual chamber Medtronic advisa MRI  conditional implanted February 2015     Medications Prior to Admission  Medication Sig Dispense Refill Last Dose  . Biotin 5000 MCG TABS Take 5,000 mcg by mouth in the morning.   07/08/2020 at Unknown time  . BLACK ELDERBERRY PO Take 1 tablet by mouth in the morning and at bedtime. Sambucol Quick Dissolve Tablets   07/08/2020 at Unknown time  . diltiazem (CARDIZEM) 120 MG tablet TAKE 3 TABLETS DAILY 270 tablet 0 07/09/2020 at 0345  . ELIQUIS 5 MG TABS tablet Take 1 tablet by mouth twice daily (Patient taking differently: Take 5 mg by mouth 2 (two) times daily.) 180 tablet 1 07/06/2020 at pm  . levothyroxine (SYNTHROID, LEVOTHROID) 25 MCG tablet Take 25 mcg by mouth daily before breakfast.  0 07/09/2020 at 0345  . Melatonin 5 MG TABS Take 5 mg by mouth at bedtime as needed (for sleep).   07/08/2020 at Unknown time  . metoprolol tartrate (LOPRESSOR) 50 MG tablet TAKE 1/2 TABLET TWICE A DAY (Patient taking differently: Take 25 mg by mouth 2 (two) times daily.) 90 tablet 3 07/09/2020 at 0345  . Vitamin D3 (VITAMIN D) 25 MCG tablet Take 1,000 Units by mouth every evening.   07/08/2020 at Unknown time       09/08/2020 07/09/2020,1:01 PM

## 2020-07-09 NOTE — H&P (Signed)
Priscilla [name] is an 55 y.o. female.   Chief Complaint: obesity HPI: 55 yo female with long history of obesity with hypertension, CHF, diabetes, atrial fibrillation.  Past Medical History:  Diagnosis Date  . Atrial fibrillation (HCC) 2015  . Atrial flutter (HCC) 2015   status post a flutter ablation and subsequent Medtronic pacemaker, battery 2015  . CHF (congestive heart failure) (HCC)   . Diabetes mellitus without complication (HCC)    type 2   . Dysrhythmia    atfib/at flutter   . Heart murmur   . History of colonoscopy 01/23/15  . History of mammogram 11/13/14  . Hypertension   . Hypothyroidism   . Obese   . Presence of permanent cardiac pacemaker   . Sick sinus syndrome (HCC) 08/01/2014   Dual chamber Medtronic advisa MRI conditional implanted February 2015    Past Surgical History:  Procedure Laterality Date  . ATRIAL FLUTTER ABLATION  2015   in [place]  . BLADDER SUSPENSION N/A 01/05/2015   Procedure: TRANSVAGINAL TAPE (TVT) PROCEDURE;  Surgeon: Osborn Coho, MD;  Location: [facility];  Service: Gynecology;  Laterality: N/A;  . CARDIAC CATHETERIZATION  03/2013  . CARDIOVERSION N/A 12/18/2015   Procedure: CARDIOVERSION;  Surgeon: Thurmon Fair, MD;  Location: [facility];  Service: Cardiovascular;  Laterality: N/A;  . CESAREAN SECTION     x3  . CYSTOSCOPY N/A 01/05/2015   Procedure: CYSTOSCOPY;  Surgeon: Osborn Coho, MD;  Location: [facility];  Service: Gynecology;  Laterality: N/A;  . PACEMAKER INSERTION  03/2013   Medtronic Advisa DR MRI implanted in [place] for sick sinus syndrome    Family History  Problem Relation Age of Onset  . Stroke Mother        x3  . Rheumatic fever Mother   . Heart attack Father 66  . Breast cancer Maternal Grandmother   . Heart attack Paternal Grandfather 63   Social History:  reports that she has never smoked. She has never used smokeless tobacco. She reports current alcohol use of about 2.0 - 4.0 standard drinks of alcohol per week. She  reports that she does not use drugs.  Allergies: No Known Allergies  Medications Prior to Admission  Medication Sig Dispense Refill  . Biotin 5000 MCG TABS Take 5,000 mcg by mouth in the morning.    Marland Kitchen BLACK ELDERBERRY PO Take 1 tablet by mouth in the morning and at bedtime. Sambucol Quick Dissolve Tablets    . diltiazem (CARDIZEM) 120 MG tablet TAKE 3 TABLETS DAILY 270 tablet 0  . ELIQUIS 5 MG TABS tablet Take 1 tablet by mouth twice daily (Patient taking differently: Take 5 mg by mouth 2 (two) times daily.) 180 tablet 1  . levothyroxine (SYNTHROID, LEVOTHROID) 25 MCG tablet Take 25 mcg by mouth daily before breakfast.  0  . Melatonin 5 MG TABS Take 5 mg by mouth at bedtime as needed (for sleep).    . metoprolol tartrate (LOPRESSOR) 50 MG tablet TAKE 1/2 TABLET TWICE A DAY (Patient taking differently: Take 25 mg by mouth 2 (two) times daily.) 90 tablet 3  . Vitamin D3 (VITAMIN D) 25 MCG tablet Take 1,000 Units by mouth every evening.      Results for orders placed or performed during the hospital encounter of 07/09/20 (from the past 48 hour(s))  Glucose, capillary     Status: Abnormal   Collection Time: 07/09/20  5:42 AM  Result Value Ref Range   Glucose-Capillary 126 (H) 70 - 99 mg/dL  Comment: Glucose reference range applies only to samples taken after fasting for at least 8 hours.   No results found.  Review of Systems  Constitutional: Negative for chills and fever.  HENT: Negative for hearing loss.   Respiratory: Negative for cough.   Cardiovascular: Negative for chest pain and palpitations.  Gastrointestinal: Negative for abdominal pain, nausea and vomiting.  Genitourinary: Negative for dysuria and urgency.  Musculoskeletal: Negative for myalgias and neck pain.  Skin: Negative for rash.  Neurological: Negative for dizziness and headaches.  Hematological: Does not bruise/bleed easily.  Psychiatric/Behavioral: Negative for suicidal ideas.    Blood pressure 119/60, pulse  64, temperature 97.7 F (36.5 C), temperature source Oral, resp. rate 18, height 5\' 2"  (1.575 m), weight 110.9 kg, last menstrual period 12/25/2014, SpO2 98 %. Physical Exam Vitals reviewed.  Constitutional:      Appearance: She is well-developed.  HENT:     Head: Normocephalic and atraumatic.  Eyes:     Conjunctiva/sclera: Conjunctivae normal.     Pupils: Pupils are equal, round, and reactive to light.  Cardiovascular:     Rate and Rhythm: Normal rate and regular rhythm.  Pulmonary:     Effort: Pulmonary effort is normal.     Breath sounds: Normal breath sounds.  Abdominal:     General: Bowel sounds are normal. There is no distension.     Palpations: Abdomen is soft.     Tenderness: There is no abdominal tenderness.  Musculoskeletal:        General: Normal range of motion.     Cervical back: Normal range of motion and neck supple.  Skin:    General: Skin is warm and dry.  Neurological:     Mental Status: She is alert and oriented to person, place, and time.  Psychiatric:        Behavior: Behavior normal.      Assessment/Plan 55 yo female with long history of obesity with multiple comorbidities. -lap sleeve gastrectomy with upper endoscopy -ERAS protocol -inpatient admission  57, MD 07/09/2020, 7:09 AM

## 2020-07-09 NOTE — Progress Notes (Signed)

## 2020-07-09 NOTE — Transfer of Care (Signed)
Immediate Anesthesia Transfer of Care Note  Patient: Priscilla [name]  Procedure(s) Performed: LAPAROSCOPIC GASTRIC SLEEVE RESECTION (N/A ) UPPER GI ENDOSCOPY (N/A )  Patient Location: PACU  Anesthesia Type:General  Level of Consciousness: awake, alert  and oriented  Airway & Oxygen Therapy: Patient Spontanous Breathing and Patient connected to face mask oxygen  Post-op Assessment: Report given to RN and Post -op Vital signs reviewed and stable  Post vital signs: Reviewed and stable  Last Vitals:  Vitals Value Taken Time  BP 149/77 07/09/20 0845  Temp    Pulse 64 07/09/20 0845  Resp 14 07/09/20 0845  SpO2 99 % 07/09/20 0845  Vitals shown include unvalidated device data.  Last Pain:  Vitals:   07/09/20 0609  TempSrc:   PainSc: 0-No pain         Complications: No complications documented.

## 2020-07-10 ENCOUNTER — Other Ambulatory Visit (HOSPITAL_COMMUNITY): Payer: Self-pay

## 2020-07-10 ENCOUNTER — Encounter (HOSPITAL_COMMUNITY): Payer: Self-pay | Admitting: General Surgery

## 2020-07-10 LAB — CBC WITH DIFFERENTIAL/PLATELET
Abs Immature Granulocytes: 0.05 10*3/uL (ref 0.00–0.07)
Basophils Absolute: 0 10*3/uL (ref 0.0–0.1)
Basophils Relative: 0 %
Eosinophils Absolute: 0 10*3/uL (ref 0.0–0.5)
Eosinophils Relative: 0 %
HCT: 39.2 % (ref 36.0–46.0)
Hemoglobin: 13 g/dL (ref 12.0–15.0)
Immature Granulocytes: 0 %
Lymphocytes Relative: 10 %
Lymphs Abs: 1.3 10*3/uL (ref 0.7–4.0)
MCH: 29.7 pg (ref 26.0–34.0)
MCHC: 33.2 g/dL (ref 30.0–36.0)
MCV: 89.5 fL (ref 80.0–100.0)
Monocytes Absolute: 0.6 10*3/uL (ref 0.1–1.0)
Monocytes Relative: 5 %
Neutro Abs: 11.4 10*3/uL — ABNORMAL HIGH (ref 1.7–7.7)
Neutrophils Relative %: 85 %
Platelets: 263 10*3/uL (ref 150–400)
RBC: 4.38 MIL/uL (ref 3.87–5.11)
RDW: 13.5 % (ref 11.5–15.5)
WBC: 13.5 10*3/uL — ABNORMAL HIGH (ref 4.0–10.5)
nRBC: 0 % (ref 0.0–0.2)

## 2020-07-10 LAB — COMPREHENSIVE METABOLIC PANEL
ALT: 21 U/L (ref 0–44)
AST: 23 U/L (ref 15–41)
Albumin: 3.3 g/dL — ABNORMAL LOW (ref 3.5–5.0)
Alkaline Phosphatase: 61 U/L (ref 38–126)
Anion gap: 7 (ref 5–15)
BUN: 13 mg/dL (ref 6–20)
CO2: 26 mmol/L (ref 22–32)
Calcium: 9 mg/dL (ref 8.9–10.3)
Chloride: 103 mmol/L (ref 98–111)
Creatinine, Ser: 0.84 mg/dL (ref 0.44–1.00)
GFR, Estimated: >60 mL/min (ref >60)
Glucose, Bld: 148 mg/dL — ABNORMAL HIGH (ref 70–99)
Potassium: 4 mmol/L (ref 3.5–5.1)
Sodium: 136 mmol/L (ref 135–145)
Total Bilirubin: 0.2 mg/dL — ABNORMAL LOW (ref 0.3–1.2)
Total Protein: 6.9 g/dL (ref 6.5–8.1)

## 2020-07-10 LAB — GLUCOSE, CAPILLARY
Glucose-Capillary: 146 mg/dL — ABNORMAL HIGH (ref 70–99)
Glucose-Capillary: 107 mg/dL — ABNORMAL HIGH (ref 70–99)

## 2020-07-10 MED ORDER — ONDANSETRON 4 MG PO TBDP
4.0000 mg | ORAL_TABLET | Freq: Four times a day (QID) | ORAL | 0 refills | Status: DC | PRN
  Filled 2020-07-10: qty 20, 5d supply, fill #0

## 2020-07-10 MED ORDER — PANTOPRAZOLE SODIUM 40 MG PO TBEC
40.0000 mg | DELAYED_RELEASE_TABLET | Freq: Every day | ORAL | 0 refills | Status: DC
  Filled 2020-07-10: qty 30, 30d supply, fill #0

## 2020-07-10 MED ORDER — ACETAMINOPHEN 500 MG PO TABS: 1000.0000 mg | ORAL_TABLET | Freq: Three times a day (TID) | ORAL | 0 refills | Status: AC

## 2020-07-10 NOTE — Progress Notes (Signed)
Patient alert and oriented, pain is controlled. Patient is tolerating fluids, advanced to protein shake today, patient is tolerating well.  Reviewed Gastric sleeve discharge instructions with patient and patient is able to articulate understanding.  Provided information on BELT program, Support Group and [facility] outpatient pharmacy. All questions answered, will continue to monitor.  

## 2020-07-10 NOTE — Progress Notes (Signed)
24hr fluid recall: .  Per dehydration protocol, will call pt to f/u within one week post op.

## 2020-07-10 NOTE — Progress Notes (Signed)
Pt received teaching and d/c instructions from [employer], Surveyor, quantity. Pt d/cd home.

## 2020-07-10 NOTE — Discharge Instructions (Signed)
GASTRIC BYPASS / SLEEVE  Home Care Instructions  These instructions are to help you care for yourself when you go home.  Call: If you have any problems. . Call [phone] and ask for the surgeon on call . If you have an emergency related to your surgery please use the ER at [facility].  . Tell the ER staff that you are a new post-op gastric bypass or gastric sleeve patient   Signs and symptoms to report: . Severe vomiting or nausea o If you cannot handle clear liquids for longer than 1 day, call your surgeon  . Abdominal pain which does not get better after taking your pain medication . Fever greater than 100.4 F and chills . Heart rate over 100 beats a minute . Trouble breathing . Chest pain .  Redness, swelling, drainage, or foul odor at incision (surgical) sites .  If your incisions open or pull apart . Swelling or pain in calf (lower leg) . Diarrhea (Loose bowel movements that happen often), frequent watery, uncontrolled bowel movements . Constipation, (no bowel movements for 3 days) if this happens:  o Take Milk of Magnesia, 2 tablespoons by mouth, 3 times a day for 2 days if needed o Stop taking Milk of Magnesia once you have had a bowel movement o Call your doctor if constipation continues Or o Take Miralax  (instead of Milk of Magnesia) following the label instructions o Stop taking Miralax once you have had a bowel movement o Call your doctor if constipation continues . Anything you think is "abnormal for you"   Normal side effects after surgery: . Unable to sleep at night or unable to concentrate . Irritability . Being tearful (crying) or depressed These are common complaints, possibly related to your anesthesia, stress of surgery and change in lifestyle, that usually go away a few weeks after surgery.  If these feelings continue, call your medical doctor.  Wound Care: You may have surgical glue, steri-strips, or staples over your incisions after surgery . Surgical  glue:  Looks like a clear film over your incisions and will wear off a little at a time . Steri-strips : Adhesive strips of tape over your incisions. You may notice a yellowish color on the skin under the steri-strips. This is used to make the   steri-strips stick better. Do not pull the steri-strips off - let them fall off . Staples: Staples may be removed before you leave the hospital o If you go home with staples, call [facility] at for an appointment with your surgeon's nurse to have staples removed 10 days after surgery, [phone] . Showering: You may shower two (2) days after your surgery unless your surgeon tells you differently o Wash gently around incisions with warm soapy water, rinse well, and gently pat dry  o If you have a drain (tube from your incision), you may need someone to hold this while you shower  o No tub baths until staples are removed and incisions are healed     Medications: . Medications should be liquid or crushed if larger than the size of a dime . Extended release pills (medication that releases a little bit at a time through the day) should not be crushed . Depending on the size and number of medications you take, you may need to space (take a few throughout the day)/change the time you take your medications so that you do not over-fill your pouch (smaller stomach) . Make sure you follow-up with   your primary care physician to make medication changes needed during rapid weight loss and life-style changes . If you have diabetes, follow up with the doctor that orders your diabetes medication(s) within one week after surgery and check your blood sugar regularly. . Do not drive while taking narcotics (pain medications) . DO NOT take NSAID'S (Examples of NSAID's include ibuprofen, naproxen)  Diet:                    First 2 Weeks  You will see the nutritionist about two (2) weeks after your surgery. The nutritionist will increase the types of foods you can  eat if you are handling liquids well: . If you have severe vomiting or nausea and cannot handle clear liquids lasting longer than 1 day, call your surgeon  Protein Shake . Drink at least 2 ounces of shake 5-6 times per day . Each serving of protein shakes (usually 8 - 12 ounces) should have a minimum of:  o 15 grams of protein  o And no more than 5 grams of carbohydrate  . Goal for protein each day: o Men = 80 grams per day o Women = 60 grams per day . Protein powder may be added to fluids such as non-fat milk or Lactaid milk or Soy milk (limit to 35 grams added protein powder per serving)  Hydration . Slowly increase the amount of water and other clear liquids as tolerated (See Acceptable Fluids) . Slowly increase the amount of protein shake as tolerated  .  Sip fluids slowly and throughout the day . May use sugar substitutes in small amounts (no more than 6 - 8 packets per day; i.e. Splenda)  Fluid Goal . The first goal is to drink at least 8 ounces of protein shake/drink per day (or as directed by the nutritionist);  See handout from pre-op Bariatric Education Class for examples of protein shake/drink.   o Slowly increase the amount of protein shake you drink as tolerated o You may find it easier to slowly sip shakes throughout the day o It is important to get your proteins in first . Your fluid goal is to drink 64 - 100 ounces of fluid daily o It may take a few weeks to build up to this . 32 oz (or more) should be clear liquids  And  . 32 oz (or more) should be full liquids (see below for examples) . Liquids should not contain sugar, caffeine, or carbonation  Clear Liquids: . Water or Sugar-free flavored water (i.e. Fruit H2O, Propel) . Decaffeinated coffee or tea (sugar-free) . Quashaun Lazalde Lite, Wyler's Lite, Minute Maid Lite . Sugar-free Jell-O . Bouillon or broth . Sugar-free Popsicle:   *Less than 20 calories each; Limit 1 per day  Full Liquids: Protein Shakes/Drinks + 2  choices per day of other full liquids . Full liquids must be: o No More Than 12 grams of Carbs per serving  o No More Than 3 grams of Fat per serving . Strained low-fat cream soup . Non-Fat milk . Fat-free Lactaid Milk . Sugar-free yogurt (Dannon Lite & Fit, Greek yogurt)      Vitamins and Minerals . Start 1 day after surgery unless otherwise directed by your surgeon . Bariatric Specific Complete Multivitamins . Chewable Calcium Citrate with Vitamin D-3 (Example: 3 Chewable Calcium Plus 600 with Vitamin D-3) o Take 500 mg three (3) times a day for a total of 1500 mg each day o Do not take all 3 doses   of calcium at one time as it may cause constipation, and you can only absorb 500 mg  at a time  o Do not mix multivitamins containing iron with calcium supplements; take 2 hours apart  . Menstruating women and those at risk for anemia (a blood disease that causes weakness) may need extra iron o Talk with your doctor to see if you need more iron . If you need extra iron: Total daily Iron recommendation (including Vitamins) is 50 to 100 mg Iron/day . Do not stop taking or change any vitamins or minerals until you talk to your nutritionist or surgeon . Your nutritionist and/or surgeon must approve all vitamin and mineral supplements   Activity and Exercise: It is important to continue walking at home.  Limit your physical activity as instructed by your doctor.  During this time, use these guidelines: . Do not lift anything greater than ten (10) pounds for at least two (2) weeks . Do not go back to work or drive until your surgeon says you can . You may have sex when you feel comfortable  o It is VERY important for female patients to use a reliable birth control method; fertility often increases after surgery  o Do not get pregnant for at least 18 months . Start exercising as soon as your doctor tells you that you can o Make sure your doctor approves any physical activity . Start with a  simple walking program . Walk 5-15 minutes each day, 7 days per week.  . Slowly increase until you are walking 30-45 minutes per day Consider joining our BELT program. [phone] or email [email or web address]   Special Instructions Things to remember:  . Use your CPAP when sleeping if this applies to you, do not stop the use of CPAP unless directed by physician after a sleep study . [facility] has a free Bariatric Surgery Support Group that meets monthly, the 3rd Thursday, 6 pm.  Please review discharge information for date and location of this meeting. . It is very important to keep all follow up appointments with your surgeon, nutritionist, primary care physician, and behavioral health practitioner o After the first year, please follow up with your bariatric surgeon and nutritionist at least once a year in order to maintain best weight loss results   [facility]: [phone] [facility]: [phone] Bariatric Nurse Coordinator: [phone]      

## 2020-07-10 NOTE — Discharge Summary (Signed)
Physician Discharge Summary  Priscilla [name] CWC:[id number] DOB: [birth year] DOA: 07/09/2020  PCP: Jarrett Soho, PA-C  Admit date: 07/09/2020 Discharge date: 07/10/2020  Recommendations for Outpatient Follow-up:  1.  (include homehealth, outpatient follow-up instructions, specific recommendations for PCP to follow-up on, etc.)   Follow-up Information    Priscilla [name], De Blanch, MD. Go on 08/02/2020.   Specialty: General Surgery Why: at 9:10am.  Please arrive 15 minutes prior to your appointment time.  Thank you. Contact information: [address] [place] [address] [phone]        Surgery, [place]. Go on 08/28/2020.   Specialty: General Surgery Why: at 1:40pm with Dr. Sheliah Hatch.  Please arrive 15 minutes prior to your appointment time.  Thank you. Contact information: [address] [place] [address] [phone]              Discharge Diagnoses:  Active Problems:   Obesity   Surgical Procedure: laparoscopic sleeve gastrectomy, upper endoscopy  Discharge Condition: Good Disposition: Home  Diet recommendation: Postoperative sleeve gastrectomy diet (liquids only)  Filed Weights   07/09/20 0542  Weight: 110.9 kg     Hospital Course:  The patient was admitted after undergoing laparoscopic sleeve gastrectomy. POD 0 she ambulated well. POD 1 she was started on the water diet protocol and tolerated 600 ml in the first shift. Once meeting the water amount she was advanced to bariatric protein shakes which they tolerated and were discharged home POD 1.  Treatments: surgery: laparoscopic sleeve gastrectomy  Discharge Instructions  Discharge Instructions    Ambulate hourly while awake   Complete by: As directed    Call MD for:  difficulty breathing, headache or visual disturbances   Complete by: As directed    Call MD for:  persistant dizziness or light-headedness   Complete by: As directed    Call MD for:  persistant nausea  and vomiting   Complete by: As directed    Call MD for:  redness, tenderness, or signs of infection (pain, swelling, redness, odor or green/yellow discharge around incision site)   Complete by: As directed    Call MD for:  severe uncontrolled pain   Complete by: As directed    Call MD for:  temperature >101 F   Complete by: As directed    Diet bariatric full liquid   Complete by: As directed    Discharge wound care:   Complete by: As directed    Remove Bandaids tomorrow, ok to shower tomorrow. Steristrips may fall off in 1-3 weeks.   Incentive spirometry   Complete by: As directed    Perform hourly while awake     Allergies as of 07/10/2020   No Known Allergies     Medication List    TAKE these medications   acetaminophen 500 MG tablet Commonly known as: TYLENOL Take 2 tablets (1,000 mg total) by mouth every 8 (eight) hours for 5 days.   Biotin 5000 MCG Tabs Take 5,000 mcg by mouth in the morning.   BLACK ELDERBERRY PO Take 1 tablet by mouth in the morning and at bedtime. Sambucol Quick Dissolve Tablets   diltiazem 120 MG tablet Commonly known as: CARDIZEM TAKE 3 TABLETS DAILY   Eliquis 5 MG Tabs tablet Generic drug: apixaban Take 1 tablet by mouth twice daily What changed: how much to take   levothyroxine 25 MCG tablet Commonly known as: SYNTHROID Take 25 mcg by mouth daily before breakfast.   melatonin 5 MG  Tabs Take 5 mg by mouth at bedtime as needed (for sleep).   metoprolol tartrate 50 MG tablet Commonly known as: LOPRESSOR TAKE 1/2 TABLET TWICE A DAY   ondansetron 4 MG disintegrating tablet Commonly known as: ZOFRAN-ODT Dissolve 1 tablet (4 mg total) by mouth every 6 (six) hours as needed for nausea or vomiting.   pantoprazole 40 MG tablet Commonly known as: PROTONIX Take 1 tablet (40 mg total) by mouth daily.   Vitamin D3 25 MCG tablet Commonly known as: Vitamin D Take 1,000 Units by mouth every evening.            Discharge Care  Instructions  (From admission, onward)         Start     Ordered   07/10/20 0000  Discharge wound care:       Comments: Remove Bandaids tomorrow, ok to shower tomorrow. Steristrips may fall off in 1-3 weeks.   07/10/20 0753          Follow-up Information    Priscilla [name], De Blanch, MD. Go on 08/02/2020.   Specialty: General Surgery Why: at 9:10am.  Please arrive 15 minutes prior to your appointment time.  Thank you. Contact information: [address] [place] [address] [phone]        Surgery, [place]. Go on 08/28/2020.   Specialty: General Surgery Why: at 1:40pm with Dr. Sheliah Hatch.  Please arrive 15 minutes prior to your appointment time.  Thank you. Contact information: [address] [place] [address] [phone]                The results of significant diagnostics from this hospitalization (including imaging, microbiology, ancillary and laboratory) are listed below for reference.    Significant Diagnostic Studies: CUP PACEART REMOTE DEVICE CHECK  Result Date: 06/14/2020 Scheduled remote reviewed. Normal device function.  Known PAF, on OAC, AF burden is 0.3%.  There were ten atrial treated arrhythmias detected, three were successful.  The longest episode was 2.5 hours. Next remote 91 days.  Priscilla Halim, RN, CCDS, [employer]   Labs: Basic Metabolic Panel: Recent Labs  Lab 07/10/20 0426  NA 136  K 4.0  CL 103  CO2 26  GLUCOSE 148*  BUN 13  CREATININE 0.84  CALCIUM 9.0   Liver Function Tests: Recent Labs  Lab 07/10/20 0426  AST 23  ALT 21  ALKPHOS 61  BILITOT 0.2*  PROT 6.9  ALBUMIN 3.3*    CBC: Recent Labs  Lab 07/09/20 0848 07/10/20 0426  WBC  --  13.5*  NEUTROABS  --  11.4*  HGB 13.8 13.0  HCT 42.5 39.2  MCV  --  89.5  PLT  --  263    CBG: Recent Labs  Lab 07/09/20 1550 07/09/20 2013 07/09/20 2347 07/10/20 0339 07/10/20 0727  GLUCAP 167* 167* 135* 146* 107*    Active  Problems:   Obesity  VTE plan: will restart eliquis on 6/8 ([email or web address])  Time coordinating discharge: 15 min

## 2020-07-10 NOTE — Progress Notes (Signed)
Nutrition Education Note ° °Received consult for diet education for patient s/p bariatric surgery. ° °Discussed 2 week post op diet with pt. Emphasized that liquids must be non carbonated, non caffeinated, and sugar free. Fluid goals discussed. Pt to follow up with outpatient bariatric RD for further diet progression after 2 weeks. Multivitamins and minerals also reviewed. Teach back method used, pt expressed understanding, expect good compliance. ° °If nutrition issues arise, please consult RD. ° °Priscilla Mineau, MS, RD, LDN °Inpatient Clinical Dietitian °Contact information available via Amion ° ° °

## 2020-07-11 LAB — SURGICAL PATHOLOGY

## 2020-07-12 NOTE — Op Note (Signed)
   Patient: Priscilla [name] (01-26-1966, [id number])  Date of Surgery: 07/09/2020   Preoperative Diagnosis: morbid obesity   Postoperative Diagnosis: morbid obesity   Surgical Procedure: Upper Endoscopy   Surgeon: Ivar Drape, MD  Anesthesiologist: Heather Roberts, MD CRNA: Lucinda Dell, CRNA; Orest Dikes, CRNA   Anesthesia: General   Fluids:  Total I/O In: 461.8 [I.V.:411.8; IV Piggyback:50] Out: 200 [Urine:200]  Complications: None  Drains:  None  Specimen: None   Indications for Procedure: Priscilla [name] is a 55 y.o. female undergoing laparoscopic sleeve gastrectomy and an EGD was requested to evaluate foregut anatomy intraoperatively.  Description of Procedure: During the procedure, I scrubbed out and obtained the Olympus endoscope. I gently placed endoscope in the patient's oropharynx and gently glided it down the esophagus without any difficulty under direct visualization.  The scope was advanced as far as the pylorus and then slowly withdrawn to inspect the foregut anatomy.  Dr. Sheliah Hatch had placed saline in the upper abdomen and all staple lines were submerged to ensure no air leak. There was no evidence of bubbles. There was no evidence of intraluminal bleeding and the mucosa appeared healthy.  The lumen was widely patent without evidence of stricture.  The intraluminal insufflation was decompressed. The scope was withdrawn. The patient tolerated this portion of the procedure well. Please see Dr Guerry Minors operative note for details regarding the remainder of the procedure.    Ivar Drape, MD General, Bariatric, & Minimally Invasive Surgery [facility], [place]

## 2020-07-16 ENCOUNTER — Telehealth (HOSPITAL_COMMUNITY): Payer: Self-pay | Admitting: *Deleted

## 2020-07-16 MED ORDER — DILTIAZEM HCL 60 MG PO TABS: 120.0000 mg | ORAL_TABLET | Freq: Three times a day (TID) | ORAL | 3 refills | Status: DC

## 2020-07-16 MED ORDER — DILTIAZEM HCL 60 MG PO TABS: 120.0000 mg | ORAL_TABLET | Freq: Three times a day (TID) | ORAL | 0 refills | Status: DC

## 2020-07-16 NOTE — Telephone Encounter (Signed)
1.  Tell me about your pain and pain management? Pt denies any pain.   2.  Let's talk about fluid intake.  How much total fluid are you taking in? Pt states that she is getting in at least 80-90 oz of fluid including protein shakes, bottled water, decaf ice tea, protein soups, and yogurt.  3.  How much protein have you taken in the last 2 days? Pt states she is meeting her goal of 60g of protein each day with the protein shakes, protein soups, and yogurt.  4.  Have you had nausea?  Tell me about when have experienced nausea and what you did to help? Pt denies nausea.   5.  Has the frequency or color changed with your urine? Pt states that she is urinating "fine" with no changes in frequency or urgency.     6.  Tell me what your incisions look like? "Incisions look fine". Pt denies a fever, chills.  Pt states incisions are not swollen, open, or draining.  Pt encouraged to call [facility] if incisions change.   7.  Have you been passing gas? BM? Pt states that she is having BMs. Last BM 07/14/20.     8.  If a problem or question were to arise who would you call?  Do you know contact numbers for [facility], [facility], and [facility]? Pt denies dehydration symptoms.  Pt can describe s/sx of dehydration.  Pt knows to call [facility] for surgical, [facility] for nutrition, and [facility] for non-urgent questions or concerns.   9.  How has the walking going? Pt states she is walking around and able to be active without difficulty. Pt takes walks outside with spouse several times a day.   10. Are you still using your incentive spirometer?  If so, how often? Pt states that she is no longer doing it, states that she is "breathing just fine".  Pt encouraged to use incentive spirometer, at least 10x every hour while awake until she sees the surgeon.  11.  How are your vitamins and calcium going?  How are you taking them? Pt states that she is taking her supplements and vitamins without difficulty.  Reminded patient that the first 30 days  post-operatively are important for successful recovery.  Practice good hand hygiene, wearing a mask when appropriate (since optional in most places), and minimizing exposure to people who live outside of the home, especially if they are exhibiting any respiratory, GI, or illness-like symptoms.

## 2020-07-24 ENCOUNTER — Other Ambulatory Visit: Payer: Self-pay

## 2020-07-24 ENCOUNTER — Encounter: Payer: Commercial Managed Care - PPO | Attending: General Surgery | Admitting: Skilled Nursing Facility1

## 2020-07-24 DIAGNOSIS — E669 Obesity, unspecified: Secondary | ICD-10-CM | POA: Diagnosis present

## 2020-07-24 DIAGNOSIS — E119 Type 2 diabetes mellitus without complications: Secondary | ICD-10-CM | POA: Insufficient documentation

## 2020-07-24 NOTE — Progress Notes (Signed)
2 Week Post-Operative Nutrition Class   Patient was seen on 07/24/2020 for Post-Operative Nutrition education at the [facility].    Surgery date: 07/09/2020 Surgery type: sleeve Start weight at [facility]: 284 Weight today: 237.5 Bowel Habits: Every day to every other day no complaints    07/24/2020  Current Body Weight 237.5     Pt states: Testing blood sugars daily 100-110 with A1C 5.9  The following the learning objectives were met by the patient during this course: Identifies Phase 3 (Soft, High Proteins) Dietary Goals and will begin from 2 weeks post-operatively to 2 months post-operatively Identifies appropriate sources of fluids and proteins  Identifies appropriate fat sources and healthy verses unhealthy fat types   States protein recommendations and appropriate sources post-operatively Identifies the need for appropriate texture modifications, mastication, and bite sizes when consuming solids Identifies appropriate fat consumption and sources Identifies appropriate multivitamin and calcium sources post-operatively Describes the need for physical activity post-operatively and will follow MD recommendations States when to call healthcare provider regarding medication questions or post-operative complications   Handouts given during class include: Phase 3A: Soft, High Protein Diet Handout Phase 3 High Protein Meals Healthy Fats   Follow-Up Plan: Patient will follow-up at [facility] in 6 weeks for 2 month post-op nutrition visit for diet advancement per MD.

## 2020-07-30 ENCOUNTER — Telehealth: Payer: Self-pay | Admitting: Skilled Nursing Facility1

## 2020-07-30 NOTE — Telephone Encounter (Signed)
RD called pt to verify fluid intake once starting soft, solid proteins 2 week post-bariatric surgery.   Daily Fluid intake:  Daily Protein intake: Bowel Habits:   Concerns/issues:    LVM 

## 2020-08-09 ENCOUNTER — Telehealth: Payer: Self-pay

## 2020-08-09 MED ORDER — APIXABAN 5 MG PO TABS: 5.0000 mg | ORAL_TABLET | Freq: Two times a day (BID) | ORAL | 1 refills | Status: DC

## 2020-08-09 NOTE — Telephone Encounter (Signed)
Prescription refill request for Eliquis received. Indication: Atrial fib/flutter Last office visit: 08/18/19  Croitoru Scr: 0.84 on 07/10/20 Age: 55 Weight: 128.6kg  Based on above findings Eliquis 5mg  twice daily is the appropriate dose.  Refill approved.

## 2020-08-20 ENCOUNTER — Encounter: Payer: Self-pay | Admitting: Cardiovascular Disease

## 2020-08-20 ENCOUNTER — Other Ambulatory Visit: Payer: Self-pay

## 2020-08-20 ENCOUNTER — Ambulatory Visit (INDEPENDENT_AMBULATORY_CARE_PROVIDER_SITE_OTHER): Payer: Commercial Managed Care - PPO | Admitting: Cardiovascular Disease

## 2020-08-20 VITALS — BP 110/76 | HR 77 | Ht 62.0 in | Wt 228.0 lb

## 2020-08-20 DIAGNOSIS — Z950 Presence of cardiac pacemaker: Secondary | ICD-10-CM | POA: Diagnosis not present

## 2020-08-20 DIAGNOSIS — I480 Paroxysmal atrial fibrillation: Secondary | ICD-10-CM | POA: Diagnosis not present

## 2020-08-20 DIAGNOSIS — I495 Sick sinus syndrome: Secondary | ICD-10-CM

## 2020-08-20 DIAGNOSIS — Z7901 Long term (current) use of anticoagulants: Secondary | ICD-10-CM | POA: Diagnosis not present

## 2020-08-20 DIAGNOSIS — I10 Essential (primary) hypertension: Secondary | ICD-10-CM

## 2020-08-20 NOTE — Progress Notes (Signed)
Cardiology Office Note   Date:  08/20/2020   ID:  Priscilla [name], DOB 1965/12/10, MRN [id number]  PCP:  Jarrett Soho, PA-C  Cardiologist:  Thurmon Fair, MD  Electrophysiologist:  None   Evaluation Performed:  Follow-Up Visit  Chief Complaint: Pacemaker follow-up, atrial flutter  History of Present Illness:    Priscilla [name] is a 55 y.o. female with a history of atrial flutter/atrial fibrillation with previous radiofrequency ablation followed by atrial standstill and pacemaker implantation.  She has longstanding problems with morbid obesity but has recently made excellent progress with weight loss.  She lost 40 pounds through diet and exercise.  She underwent bariatric surgery in early June and has lost 20 more pounds since then.  Generally she feels quite well.  She is very happy with her progress.  She has very infrequent palpitations.  Interrogation of her pacemaker shows 99.7% atrial paced rhythm and only 0.3% atrial fibrillation.  She almost never requires ventricular pacing.  The episodes of atrial fibrillation are associated with good ventricular rate control.  She has occasional brief episodes of paroxysmal atrial tachycardia that are fast, but not sustained.  Pacemaker function is otherwise normal.  She has a dual-chamber Medtronic advisor device implanted in 2015 that has about another 2 years of estimated longevity.  Denies falls, injuries or bleeding.  She is compliant with anticoagulation with Eliquis.  She has not had problems with shortness of breath, orthopnea, PND, lower extremity edema, angina pectoris, focal neurological complaints, claudication or syncope.  She had a normal sleep study 2018.  She had iron deficiency anemia related to menorrhagia.  She has treated hypothyroidism.    She has a history of normal coronary arteries by previous angiography. In the past, her chest discomfort seemed to improve when beta blocker dose was reduced and her AV delay was  liberalized to reduce the incidence of ventricular pacing. Echo in June 2016 showed normal left ventricular systolic function. Right heart chambers were slightly dilated, but a sleep study performed since that time has showed no evidence of sleep apnea.Dr. Johney Frame EP evaluation February 2017: "... significant atriopathy.  I anticipate that she will continue to have progressive atrial arrhythmias in the future.  Further ablation would carry low success and likely very little benefit.  If she has robust increase in atrial arrhythmias, could consider AAD therapy in the future...."   Past Medical History:  Diagnosis Date   Atrial fibrillation (HCC) 2015   Atrial flutter (HCC) 2015   status post a flutter ablation and subsequent Medtronic pacemaker, battery 2015   CHF (congestive heart failure) (HCC)    Diabetes mellitus without complication (HCC)    type 2    Dysrhythmia    atfib/at flutter    Heart murmur    History of colonoscopy 01/23/15   History of mammogram 11/13/14   Hypertension    Hypothyroidism    Obese    Presence of permanent cardiac pacemaker    Sick sinus syndrome (HCC) 08/01/2014   Dual chamber Medtronic advisa MRI conditional implanted February 2015   Past Surgical History:  Procedure Laterality Date   ATRIAL FLUTTER ABLATION  2015   in [place]   BLADDER SUSPENSION N/A 01/05/2015   Procedure: TRANSVAGINAL TAPE (TVT) PROCEDURE;  Surgeon: Osborn Coho, MD;  Location: [facility];  Service: Gynecology;  Laterality: N/A;   CARDIAC CATHETERIZATION  03/2013   CARDIOVERSION N/A 12/18/2015   Procedure: CARDIOVERSION;  Surgeon: Thurmon Fair, MD;  Location: [facility];  Service: Cardiovascular;  Laterality: N/A;   CESAREAN SECTION     x3   CYSTOSCOPY N/A 01/05/2015   Procedure: CYSTOSCOPY;  Surgeon: Osborn Coho, MD;  Location: [facility];  Service: Gynecology;  Laterality: N/A;   LAPAROSCOPIC GASTRIC SLEEVE RESECTION N/A 07/09/2020   Procedure: LAPAROSCOPIC GASTRIC SLEEVE RESECTION;   Surgeon: Sheliah Hatch, De Blanch, MD;  Location: [facility];  Service: General;  Laterality: N/A;   PACEMAKER INSERTION  03/2013   Medtronic Advisa DR MRI implanted in [place] for sick sinus syndrome   UPPER GI ENDOSCOPY N/A 07/09/2020   Procedure: UPPER GI ENDOSCOPY;  Surgeon: Rodman Pickle, MD;  Location: [facility];  Service: General;  Laterality: N/A;     No outpatient medications have been marked as taking for the 08/20/20 encounter (Appointment) with Thurmon Fair, MD.     Allergies:   Patient has no known allergies.   Social History   Tobacco Use   Smoking status: Never   Smokeless tobacco: Never  Vaping Use   Vaping Use: Never used  Substance Use Topics   Alcohol use: Yes    Alcohol/week: 2.0 - 4.0 standard drinks    Types: 2 - 4 Glasses of wine per week    Comment: minimal    Drug use: No     Family Hx: The patient's family history includes Breast cancer in her maternal grandmother; Heart attack (age of onset: 17) in her paternal grandfather; Heart attack (age of onset: 41) in her father; Rheumatic fever in her mother; Stroke in her mother.  ROS:   Please see the history of present illness.    All other systems are reviewed and are negative.   Prior CV studies:   The following studies were reviewed today:  Comprehensive pacemaker check in the office today  Labs/Other Tests and Data Reviewed:    EKG: Ordered today and reviewed personally shows atrial paced, ventricular sensed rhythm with long AV delay (400 ms), left bundle branch block (156 ms), left axis deviation, normal QTC 459 ms.  This is very similar to previous tracings.  Recent Labs: 07/10/2020: ALT 21; BUN 13; Creatinine, Ser 0.84; Hemoglobin 13.0; Platelets 263; Potassium 4.0; Sodium 136  06/10/2019 Hemoglobin 13.4, creatinine 0.88, potassium 4.4 Recent Lipid Panel No results found for: CHOL, TRIG, HDL, CHOLHDL, LDLCALC, LDLDIRECT 06/10/2019 Total cholesterol 152, HDL 61, LDL 72, triglycerides 105  Wt  Readings from Last 3 Encounters:  07/24/20 237 lb 8 oz (107.7 kg)  07/09/20 244 lb 6.4 oz (110.9 kg)  06/27/20 244 lb (110.7 kg)     Objective:    Vital Signs:  BP 110/76   Pulse 77   Ht 5\' 2"  (1.575 m)   Wt 228 lb (103.4 kg)   LMP 12/25/2014   SpO2 98%   BMI 41.70 kg/m     General: Alert, oriented x3, no distress, overly obese. Head: no evidence of trauma, PERRL, EOMI, no exophtalmos or lid lag, no myxedema, no xanthelasma; normal ears, nose and oropharynx Neck: normal jugular venous pulsations and no hepatojugular reflux; brisk carotid pulses without delay and no carotid bruits Chest: clear to auscultation, no signs of consolidation by percussion or palpation, normal fremitus, symmetrical and full respiratory excursions Cardiovascular: normal position and quality of the apical impulse, regular rhythm, normal first and second heart sounds, no murmurs, rubs or gallops Abdomen: no tenderness or distention, no masses by palpation, no abnormal pulsatility or arterial bruits, normal bowel sounds, no hepatosplenomegaly Extremities: no clubbing, cyanosis or edema; 2+ radial, ulnar and brachial pulses  bilaterally; 2+ right femoral, posterior tibial and dorsalis pedis pulses; 2+ left femoral, posterior tibial and dorsalis pedis pulses; no subclavian or femoral bruits Neurological: grossly nonfocal Psych: Normal mood and affect   ASSESSMENT & PLAN:    1. Paroxysmal atrial fibrillation (HCC)   2. SSS (sick sinus syndrome) (HCC)   3. Pacemaker   4. Long term (current) use of anticoagulants   5. Morbid obesity (HCC)   6. Essential hypertension      AFib/AFlutter: Very low burden of arrhythmia at 0.3% and not symptomatic.  She is appropriately anticoagulated.  CHA2DS2-VASc at least 2 (gender, hypertension), but embolic risk elevated due to significant atriopathy/atrial standstill.  The burden of arrhythmia is very low at 0.2% and atrial therapies sometimes appear to be successful in  converting the arrhythmia, albeit without a "clean break" most of the time. SSS: "Atrially pacemaker dependent", atrial standstill.  Her heart rate histogram appears appropriate, may be slightly blunted.  Room to make the rate response to little more aggressive if she complains of fatigue. PM: Normal device function.  Successfully avoiding ventricular pacing, albeit with a long AV conduction time (she was very poorly tolerant of ventricular pacing in the past). Anticoagulation: Compliant with Eliquis without bleeding problems, injuries or falls. Morbid obesity: Making excellent progress with weight loss following her bariatric surgery.  Has already dropped her BMI by about 10 points. HTN: Adequate control.  As she continues to lose weight, its possible we will need to cut back on her diltiazem and/oral metoprolol.  Patient Instructions  Medication Instructions:  No changes *If you need a refill on your cardiac medications before your next appointment, please call your pharmacy*   Lab Work: None ordered If you have labs (blood work) drawn today and your tests are completely normal, you will receive your results only by: MyChart Message (if you have MyChart) OR A paper copy in the mail If you have any lab test that is abnormal or we need to change your treatment, we will call you to review the results.   Testing/Procedures: None ordered   Follow-Up: At [facility], you and your health needs are our priority.  As part of our continuing mission to provide you with exceptional heart care, we have created designated Provider Care Teams.  These Care Teams include your primary Cardiologist (physician) and Advanced Practice Providers (APPs -  Physician Assistants and Nurse Practitioners) who all work together to provide you with the care you need, when you need it.  We recommend signing up for the patient portal called "MyChart".  Sign up information is provided on this After Visit Summary.   MyChart is used to connect with patients for Virtual Visits (Telemedicine).  Patients are able to view lab/test results, encounter notes, upcoming appointments, etc.  Non-urgent messages can be sent to your provider as well.   To learn more about what you can do with MyChart, go to [email or web address].    Your next appointment:   6 month(s)  The format for your next appointment:   In Person  Provider:   Thurmon Fair, MD     Signed, Thurmon Fair, MD  08/20/2020 10:52 AM    [facility]

## 2020-08-20 NOTE — Patient Instructions (Addendum)

## 2020-09-04 ENCOUNTER — Other Ambulatory Visit: Payer: Self-pay

## 2020-09-04 ENCOUNTER — Encounter: Payer: Commercial Managed Care - PPO | Attending: General Surgery | Admitting: Skilled Nursing Facility1

## 2020-09-04 NOTE — Progress Notes (Signed)
Bariatric Nutrition Follow-Up Visit Medical Nutrition Therapy   2 Months Post-Operative sleeve gastrectomy   NUTRITION ASSESSMENT    Anthropometrics  Surgery date: 07/09/2020 Surgery type: sleeve Start weight at [facility]: 284 Weight today: 225.4 pounds   Clinical  Medical hx: pace maker, HTN Medications: see list  Labs: A1C 5.9   Lifestyle & Dietary Hx  Pt sates sugar free popcicles helps with cravings.    Estimated daily fluid intake: 64+ oz Estimated daily protein intake: 60 g Supplements: capsule multi and 3 calcium  Current average weekly physical activity: 30 minutes on treadmill or stationary bike 4 days a  week   24-Hr Dietary Recall First Meal: yogurt (12g protein) Snack:   Second Meal: chicken Snack:  cheese Third Meal: chicken or shrimp Snack:  Beverages: water, unsweet decaf tea  Post-Op Goals/ Signs/ Symptoms Using straws: no Drinking while eating: no Chewing/swallowing difficulties: no Changes in vision: no Changes to mood/headaches: no Hair loss/changes to skin/nails: non Difficulty focusing/concentrating: no Sweating: no Dizziness/lightheadedness: no Palpitations: no  Carbonated/caffeinated beverages: no N/V/D/C/Gas: no Abdominal pain: no Dumping syndrome: no    NUTRITION DIAGNOSIS  Overweight/obesity ([place]-3.3) related to past poor dietary habits and physical inactivity as evidenced by completed bariatric surgery and following dietary guidelines for continued weight loss and healthy nutrition status.     NUTRITION INTERVENTION Nutrition counseling (C-1) and education (E-2) to facilitate bariatric surgery goals, including: Diet advancement to the next phase (phase 4) now including non starchy vegetables  The importance of consuming adequate calories as well as certain nutrients daily due to the body's need for essential vitamins, minerals, and fats The importance of daily physical activity and to reach a goal of at least 150 minutes of moderate  to vigorous physical activity weekly (or as directed by their physician) due to benefits such as increased musculature and improved lab values The importance of intuitive eating specifically learning hunger-satiety cues and understanding the importance of learning a new body: The importance of mindful eating to avoid grazing behaviors   Goals: -Continue to aim for a minimum of 64 fluid ounces 7 days a week with at least 30 ounces being plain water  -Eat non-starchy vegetables 2 times a day 7 days a week  -Start out with soft cooked vegetables today and tomorrow; if tolerated begin to eat raw vegetables or cooked including salads  -Eat your 3 ounces of protein first then start in on your non-starchy vegetables; once you understand how much of your meal leads to satisfaction and not full while still eating 3 ounces of protein and non-starchy vegetables you can eat them in any order   -Continue to aim for 30 minutes of activity at least 5 times a week  -Do NOT cook with/add to your food: alfredo sauce, cheese sauce, barbeque sauce, ketchup, fat back, butter, bacon grease, grease, Crisco, OR SUGAR   Handouts Provided Include  Phase 4  Learning Style & Readiness for Change Teaching method utilized: Visual & Auditory  Demonstrated degree of understanding via: Teach Back  Readiness Level: Action Barriers to learning/adherence to lifestyle change: none identified   RD's Notes for Next Visit Assess adherence to pt chosen goals    MONITORING & EVALUATION Dietary intake, weekly physical activity, body weight  Next Steps Patient is to follow-up in November

## 2020-09-13 ENCOUNTER — Ambulatory Visit (INDEPENDENT_AMBULATORY_CARE_PROVIDER_SITE_OTHER): Payer: Commercial Managed Care - PPO

## 2020-09-13 DIAGNOSIS — I495 Sick sinus syndrome: Secondary | ICD-10-CM

## 2020-09-14 LAB — CUP PACEART REMOTE DEVICE CHECK
Battery Remaining Longevity: 24 mo
Battery Voltage: 2.93 V
Brady Statistic AP VP Percent: 0.37 %
Brady Statistic AP VS Percent: 98.93 %
Brady Statistic AS VP Percent: 0.03 %
Brady Statistic AS VS Percent: 0.67 %
Brady Statistic RA Percent Paced: 98.87 %
Brady Statistic RV Percent Paced: 0.38 %
Date Time Interrogation Session: 20220811065647
Implantable Lead Implant Date: 20150207
Implantable Lead Implant Date: 20150207
Implantable Lead Location: 753859
Implantable Lead Location: 753860
Implantable Lead Model: 5076
Implantable Lead Model: 5076
Implantable Pulse Generator Implant Date: 20150207
Lead Channel Impedance Value: 361 Ohm
Lead Channel Impedance Value: 399 Ohm
Lead Channel Impedance Value: 418 Ohm
Lead Channel Impedance Value: 513 Ohm
Lead Channel Pacing Threshold Amplitude: 0.875 V
Lead Channel Pacing Threshold Amplitude: 2.125 V
Lead Channel Pacing Threshold Pulse Width: 0.4 ms
Lead Channel Pacing Threshold Pulse Width: 0.4 ms
Lead Channel Sensing Intrinsic Amplitude: 4.375 mV
Lead Channel Sensing Intrinsic Amplitude: 4.375 mV
Lead Channel Sensing Intrinsic Amplitude: 6.875 mV
Lead Channel Sensing Intrinsic Amplitude: 6.875 mV
Lead Channel Setting Pacing Amplitude: 2 V
Lead Channel Setting Pacing Amplitude: 2.5 V
Lead Channel Setting Pacing Pulse Width: 1 ms
Lead Channel Setting Sensing Sensitivity: 0.9 mV

## 2020-10-03 ENCOUNTER — Other Ambulatory Visit: Payer: Self-pay | Admitting: Cardiovascular Disease

## 2020-10-05 NOTE — Progress Notes (Signed)
Remote pacemaker transmission.   

## 2020-10-11 ENCOUNTER — Other Ambulatory Visit: Payer: Self-pay | Admitting: Cardiovascular Disease

## 2020-12-11 ENCOUNTER — Other Ambulatory Visit: Payer: Self-pay

## 2020-12-11 ENCOUNTER — Encounter: Payer: Commercial Managed Care - PPO | Attending: General Surgery | Admitting: Skilled Nursing Facility1

## 2020-12-13 ENCOUNTER — Ambulatory Visit (INDEPENDENT_AMBULATORY_CARE_PROVIDER_SITE_OTHER): Payer: Commercial Managed Care - PPO

## 2020-12-13 DIAGNOSIS — I495 Sick sinus syndrome: Secondary | ICD-10-CM

## 2020-12-13 LAB — CUP PACEART REMOTE DEVICE CHECK
Battery Remaining Longevity: 20 mo
Battery Voltage: 2.91 V
Brady Statistic AP VP Percent: 0.53 %
Brady Statistic AP VS Percent: 99.45 %
Brady Statistic AS VP Percent: 0 %
Brady Statistic AS VS Percent: 0.02 %
Brady Statistic RA Percent Paced: 99.87 %
Brady Statistic RV Percent Paced: 0.49 %
Date Time Interrogation Session: 20221110072914
Implantable Lead Implant Date: 20150207
Implantable Lead Implant Date: 20150207
Implantable Lead Location: 753859
Implantable Lead Location: 753860
Implantable Lead Model: 5076
Implantable Lead Model: 5076
Implantable Pulse Generator Implant Date: 20150207
Lead Channel Impedance Value: 342 Ohm
Lead Channel Impedance Value: 361 Ohm
Lead Channel Impedance Value: 399 Ohm
Lead Channel Impedance Value: 513 Ohm
Lead Channel Pacing Threshold Amplitude: 0.875 V
Lead Channel Pacing Threshold Amplitude: 2 V
Lead Channel Pacing Threshold Pulse Width: 0.4 ms
Lead Channel Pacing Threshold Pulse Width: 0.4 ms
Lead Channel Sensing Intrinsic Amplitude: 2 mV
Lead Channel Sensing Intrinsic Amplitude: 2 mV
Lead Channel Sensing Intrinsic Amplitude: 7.625 mV
Lead Channel Sensing Intrinsic Amplitude: 7.625 mV
Lead Channel Setting Pacing Amplitude: 2 V
Lead Channel Setting Pacing Amplitude: 2.5 V
Lead Channel Setting Pacing Pulse Width: 1 ms
Lead Channel Setting Sensing Sensitivity: 0.9 mV

## 2020-12-13 NOTE — Progress Notes (Signed)
Follow-up visit:  Post-Operative sleeve Surgery  Medical Nutrition Therapy:  Appt start time: 6:00pm end time:  7:00pm  Primary concerns today: Post-operative Bariatric Surgery Nutrition Management 6 Month Post-Op Class  Surgery date: 07/09/2020 Surgery type: sleeve Start weight at [facility]: 284 Weight today: pt declined   Information Reviewed/ Discussed During Appointment: -Review of composition scale numbers -Fluid requirements (64-100 ounces) -Protein requirements (60-80g) -Strategies for tolerating diet -Advancement of diet to include Starchy vegetables -Barriers to inclusion of new foods -Inclusion of appropriate multivitamin and calcium supplements  -Exercise recommendations   Fluid intake: adequate   Medications: See List Supplementation: appropriate   Using straws: no Drinking while eating: no Having you been chewing well: yes Chewing/swallowing difficulties: no Changes in vision: no Changes to mood/headaches: no Hair loss/Cahnges to skin/Changes to nails: no Any difficulty focusing or concentrating: no Sweating: no Dizziness/Lightheaded: no Palpitations: no  Carbonated beverages: no N/V/D/C/GAS: no Abdominal Pain: no Dumping syndrome: no  Recent physical activity:  ADL's  Progress Towards Goal(s):  In Progress Teaching method utilized: Visual & Auditory  Demonstrated degree of understanding via: Teach Back  Readiness Level: Action Barriers to learning/adherence to lifestyle change: none identified  Handouts given during visit include: Phase V diet Progression  Goals Sheet The Benefits of Exercise are endless..... Support Group Topics   Teaching Method Utilized:  Visual Auditory Hands on  Demonstrated degree of understanding via:  Teach Back   Monitoring/Evaluation:  Dietary intake, exercise, and body weight. Follow up in 3 months for 9 month post-op visit.

## 2020-12-21 IMAGING — RF DG UGI W SINGLE CM
13 of 19 series · 15 of 23 positions shown · non-contrast
Comparison: Chest radiographs 03/23/2015.

CLINICAL DATA: Morbid obesity. Additional history provided: Patient
reports preoperative examination for planned sleeve gastrectomy.

EXAM:
UPPER GI SERIES WITH KUB
TECHNIQUE: After obtaining a scout radiograph a routine upper GI series was
performed using thin and high density barium.
FLUOROSCOPY TIME:  Fluoroscopy Time:  2 minutes, 12 seconds.
Radiation Exposure Index (if provided by the fluoroscopic device):
141.2 mGy
Number of Acquired Spot Images: 9

[Series 1: t abdomen supine · 0.15mm/px · 1 of 1 slices shown]
[im 1/1]
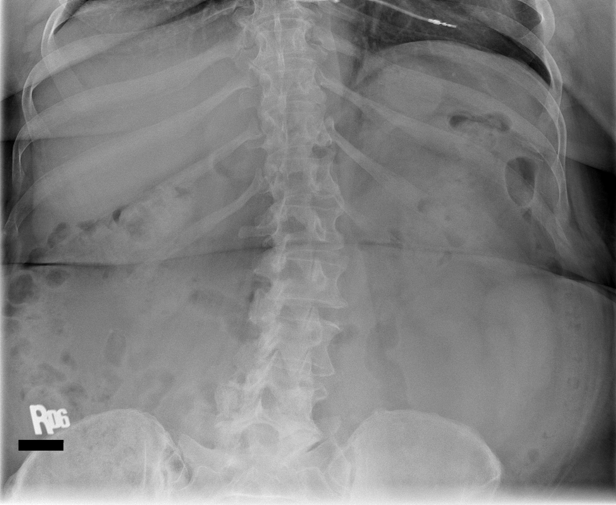

[Series 3: cp_standard · 0.34mm/px · 3 of 123 frames shown (1 of 6)]
[frame 19/123]
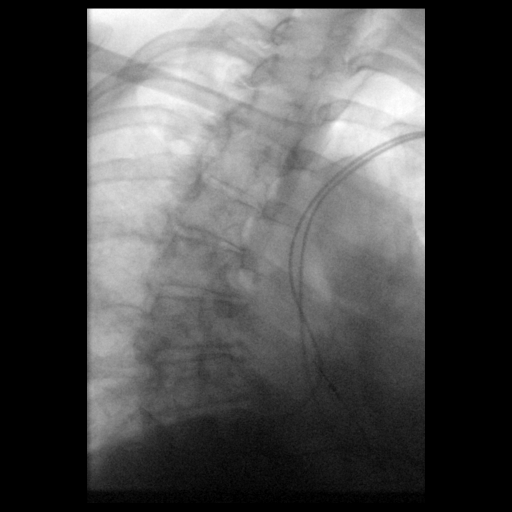
[frame 22/123]
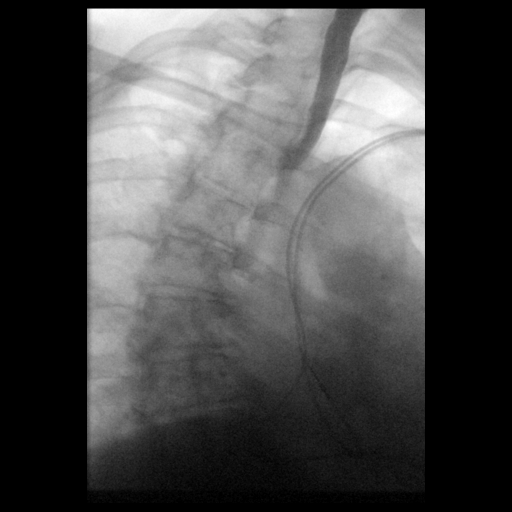
[frame 105/123]
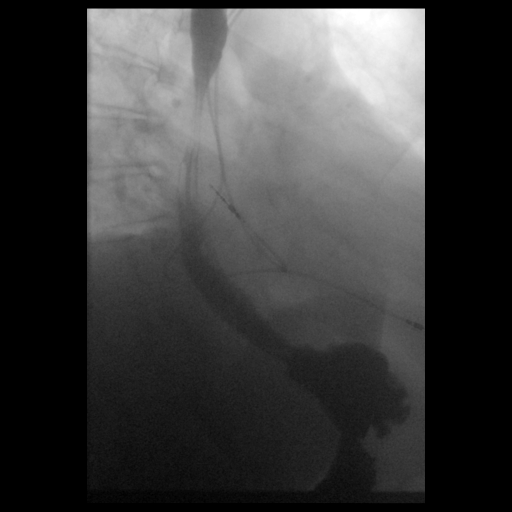

[Series 4: fluoro_barium 2fps_bw · 0.17mm/px · 1 of 2 frames shown (1 of 6)]
[frame 1/2]
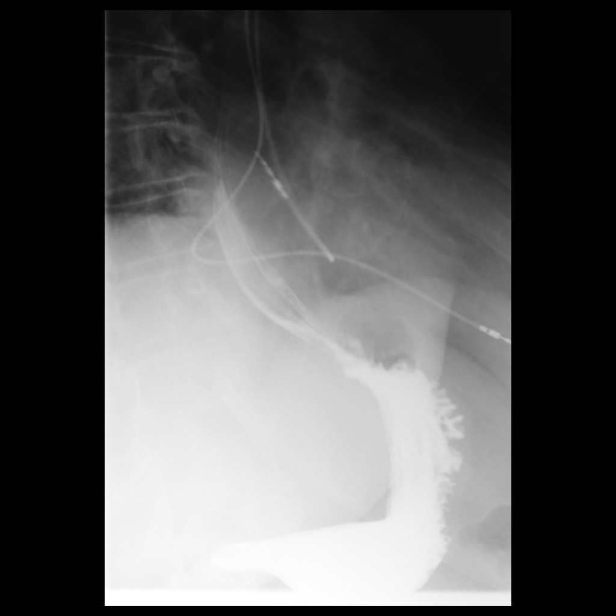

[Series 5: cp_standard · 0.17mm/px · 1 of 1 slices shown (2 of 6)]
[im 1/1]
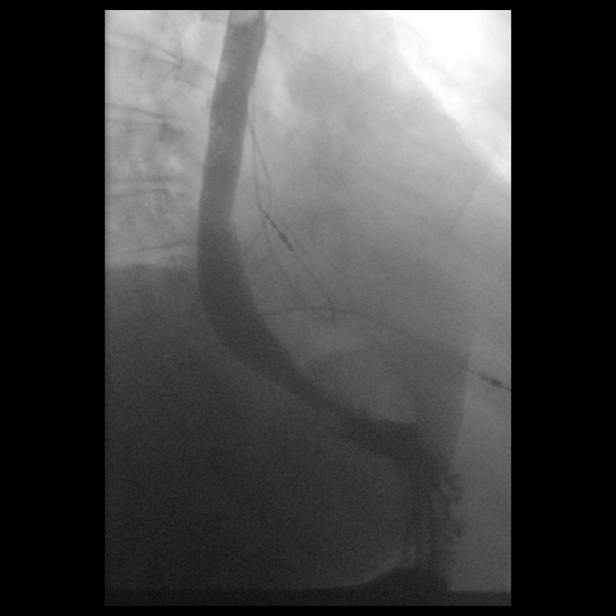

[Series 6: cp_standard · 0.17mm/px · 1 of 1 slices shown (3 of 6)]
[im 1/1]
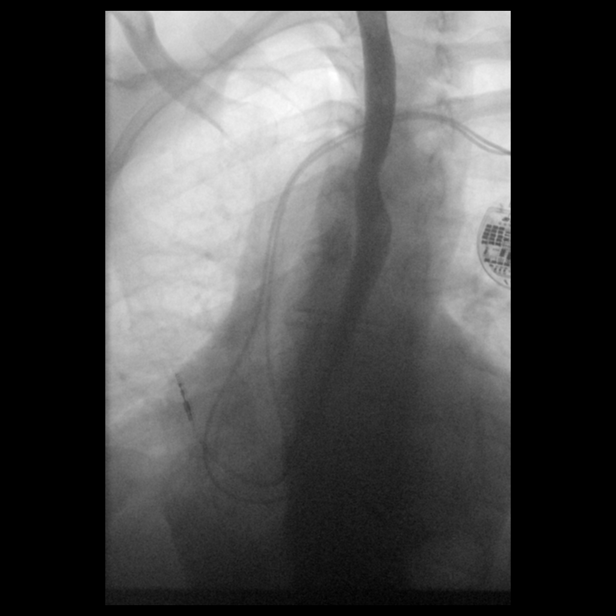

[Series 8: cp_standard · 0.17mm/px · 1 of 1 slices shown (4 of 6)]
[im 1/1]
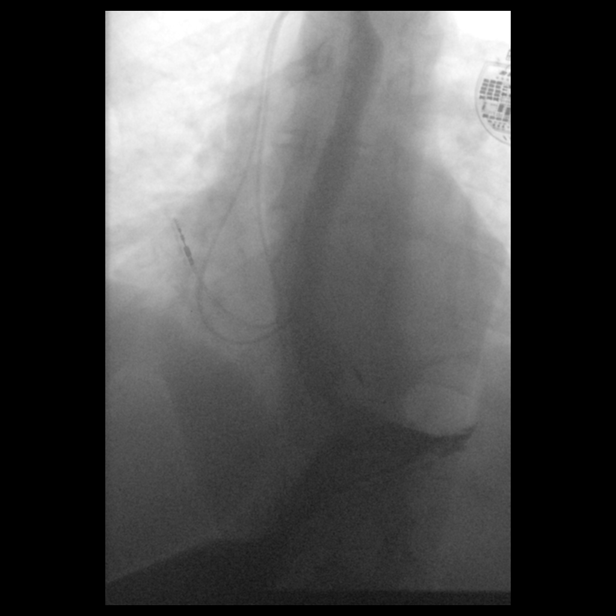

[Series 10: cp_standard · 0.17mm/px · 1 of 1 slices shown (5 of 6)]
[im 1/1]
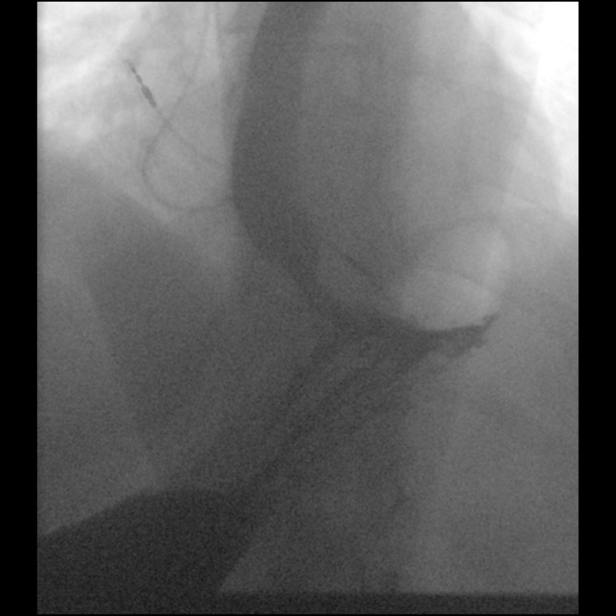

[Series 11: cp_standard · 0.17mm/px · 1 of 1 slices shown (6 of 6)]
[im 1/1]
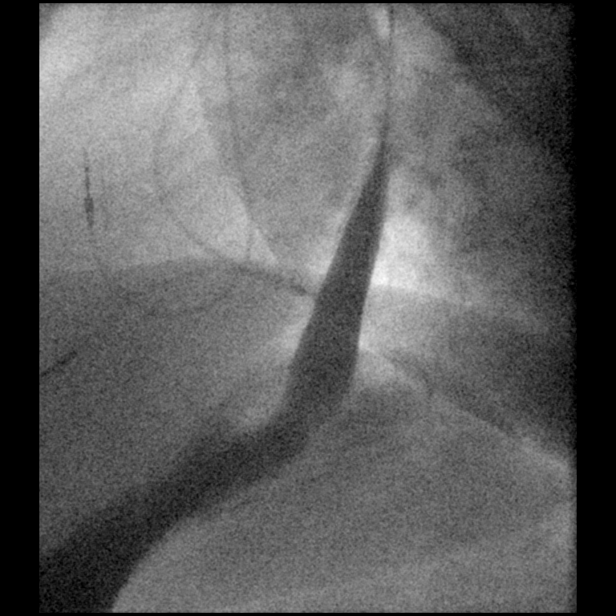

[Series 13: fluoro_barium 2fps_bw · 0.17mm/px · 1 of 1 slices shown (2 of 6)]
[im 1/1]
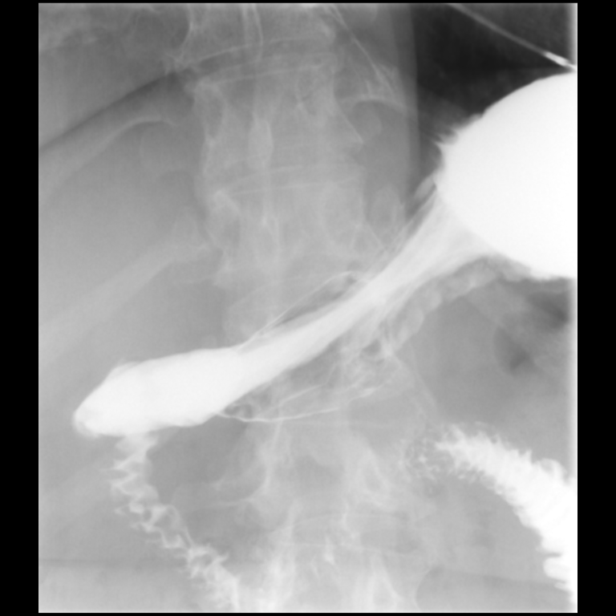

[Series 14: fluoro_barium 2fps_bw · 0.17mm/px · 1 of 1 slices shown (3 of 6)]
[im 1/1]
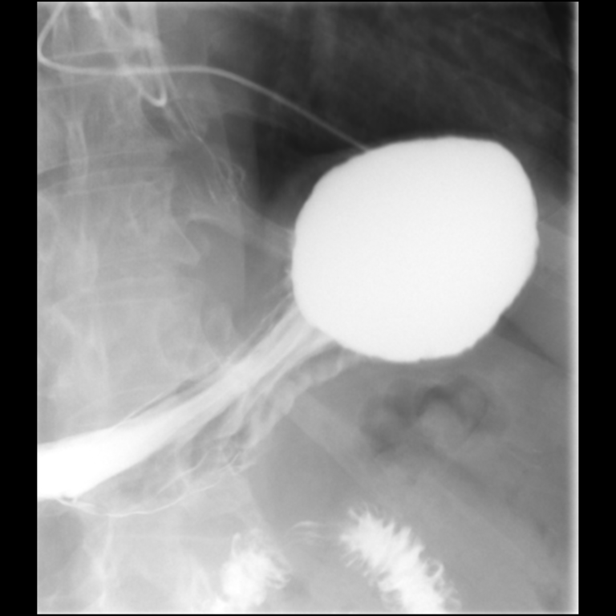

[Series 16: fluoro_barium 2fps_bw · 0.17mm/px · 1 of 1 slices shown (4 of 6)]
[im 1/1]
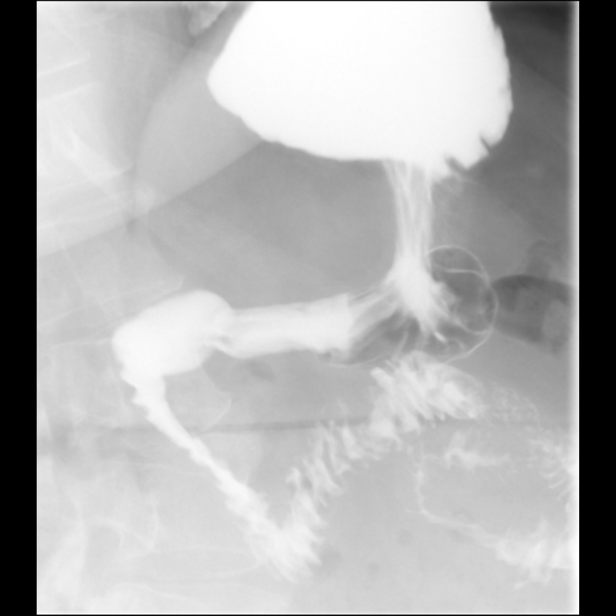

[Series 17: fluoro_barium 2fps_bw · 0.17mm/px · 1 of 1 slices shown (5 of 6)]
[im 1/1]
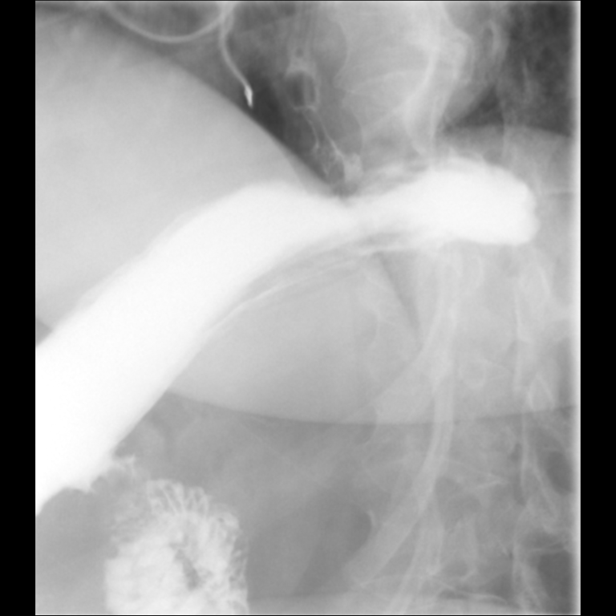

[Series 19: fluoro_barium 2fps_bw · 0.17mm/px · 1 of 1 slices shown (6 of 6)]
[im 1/1]
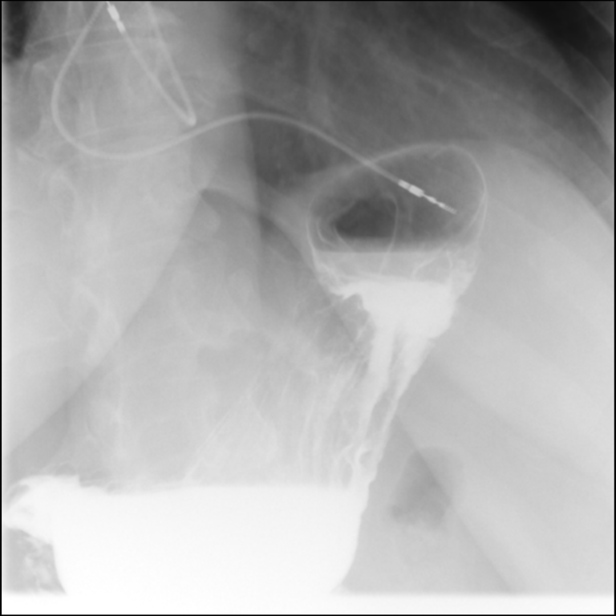

[15 of 23 positions shown; findings below may reference images not displayed]

FINDINGS: A scout radiograph of the abdomen demonstrates a nonobstructive
bowel gas pattern. Partially imaged pacemaker/AICD lead within the
lower chest. Thoracolumbar levocurvature. Degenerative changes of
the lumbar spine.

Fluoroscopic evaluation demonstrates normal caliber and smooth
contour of the esophagus. No evidence of fixed stricture, mass or
mucosal abnormality. Normal esophageal motility was observed. No
hiatal hernia. No gastroesophageal reflux was observed. The patient
swallowed a 13 mm barium tablet, which freely passed into the
stomach.

Normal appearance of the stomach, duodenal bulb and duodenal sweep.
No evidence of ulceration, fold thickening or mass.
IMPRESSION: Normal upper GI series as described.

Incidentally noted thoracolumbar levocurvature and lumbar
spondylosis.

## 2020-12-21 NOTE — Progress Notes (Signed)
Remote pacemaker transmission.   

## 2021-03-03 NOTE — Progress Notes (Signed)
Cardiology Office Note   Date:  03/04/2021   ID:  Priscilla [name], DOB 1965-12-02, MRN [id number]  PCP:  Priscilla Stalker, PA-C  Cardiologist:  Priscilla Klein, MD  Electrophysiologist:  None   Evaluation Performed:  Follow-Up Visit  Chief Complaint: Pacemaker follow-up, atrial flutter  History of Present Illness:    Priscilla [name] is a 56 y.o. female with a history of atrial flutter/atrial fibrillation with previous radiofrequency ablation followed by atrial standstill and pacemaker implantation.  She has longstanding problems with morbid obesity but has recently made excellent progress with weight loss.  She lost 40 pounds through diet and exercise.  She underwent bariatric surgery in early June 2022 and has lost 40 more pounds since then.  She has done exceptionally well since her procedure for weight loss.  She went to [employer] in December and was walking 6 hours a day.  She has not had any atrial fibrillation since July 23 when she had her last 4-hour event.  She has not been troubled by palpitations.  Pacemaker interrogation shows normal device function.  She has 100% atrial pacing and there is no atrial activity when the pacing is taken down to 35 bpm.  There is also no idioventricular escape rhythm, although she conducts through the AV node 1: 1 when there is atrial pacing.  She is "atrially pacemaker dependent".  There is only 0.7% ventricular pacing.  No episodes of high ventricular rate.   She has a dual-chamber Medtronic advisor device implanted in 2015 and has roughly 1 more year of estimated longevity.  The patient specifically denies any chest pain at rest exertion, dyspnea at rest or with exertion, orthopnea, paroxysmal nocturnal dyspnea, syncope, palpitations, focal neurological deficits, intermittent claudication, lower extremity edema, unexplained weight gain, cough, hemoptysis or wheezing.  She denies falls, injuries or bleeding.   She had a normal sleep study 2018.   She had iron deficiency anemia related to menorrhagia.  She has treated hypothyroidism.    She has a history of normal coronary arteries by previous angiography. In the past, her chest discomfort seemed to improve when beta blocker dose was reduced and her AV delay was liberalized to reduce the incidence of ventricular pacing. Echo in June 2016 showed normal left ventricular systolic function. Right heart chambers were slightly dilated, but a sleep study performed since that time has showed no evidence of sleep apnea.Dr. Rayann Heman EP evaluation February 2017: "... significant atriopathy.  I anticipate that she will continue to have progressive atrial arrhythmias in the future.  Further ablation would carry low success and likely very little benefit.  If she has robust increase in atrial arrhythmias, could consider AAD therapy in the future...."   Past Medical History:  Diagnosis Date   Atrial fibrillation ([place]) 2015   Atrial flutter ([place]) 2015   status post a flutter ablation and subsequent Medtronic pacemaker, battery 2015   CHF (congestive heart failure) ([place])    Diabetes mellitus without complication ([place])    type 2    Dysrhythmia    atfib/at flutter    Heart murmur    History of colonoscopy 01/23/15   History of mammogram 11/13/14   Hypertension    Hypothyroidism    Obese    Presence of permanent cardiac pacemaker    Sick sinus syndrome ([place]) 08/01/2014   Dual chamber Medtronic advisa MRI conditional implanted February 2015   Past Surgical History:  Procedure Laterality Date   ATRIAL FLUTTER ABLATION  2015  in [place] N/A 01/05/2015   Procedure: TRANSVAGINAL TAPE (TVT) PROCEDURE;  Surgeon: Priscilla Graff, MD;  Location: [facility];  Service: Gynecology;  Laterality: N/A;   CARDIAC CATHETERIZATION  03/2013   CARDIOVERSION N/A 12/18/2015   Procedure: CARDIOVERSION;  Surgeon: Priscilla Klein, MD;  Location: [facility];  Service: Cardiovascular;  Laterality: N/A;    CESAREAN SECTION     x3   CYSTOSCOPY N/A 01/05/2015   Procedure: CYSTOSCOPY;  Surgeon: Priscilla Graff, MD;  Location: [facility];  Service: Gynecology;  Laterality: N/A;   LAPAROSCOPIC GASTRIC SLEEVE RESECTION N/A 07/09/2020   Procedure: LAPAROSCOPIC GASTRIC SLEEVE RESECTION;  Surgeon: Priscilla [name], Priscilla Bruce, MD;  Location: [facility];  Service: General;  Laterality: N/A;   PACEMAKER INSERTION  03/2013   Medtronic Advisa DR MRI implanted in [place] for sick sinus syndrome   UPPER GI ENDOSCOPY N/A 07/09/2020   Procedure: UPPER GI ENDOSCOPY;  Surgeon: Priscilla Skinner, MD;  Location: [facility];  Service: General;  Laterality: N/A;     Current Meds  Medication Sig   apixaban (ELIQUIS) 5 MG TABS tablet Take 1 tablet (5 mg total) by mouth 2 (two) times daily.   BLACK ELDERBERRY PO Take 1 tablet by mouth in the morning and at bedtime. Sambucol Quick Dissolve Tablets   levothyroxine (SYNTHROID, LEVOTHROID) 25 MCG tablet Take 25 mcg by mouth daily before breakfast.   Melatonin 5 MG TABS Take 5 mg by mouth at bedtime as needed (for sleep).   metoprolol tartrate (LOPRESSOR) 50 MG tablet TAKE 1/2 TABLET TWICE A DAY   Multiple Vitamins-Iron (MULTIVITAMIN PLUS IRON ADULT) TABS See admin instructions.   Oyster Shell Calcium 500 MG TABS Take 500 mg by mouth 3 (three) times daily.   [DISCONTINUED] diltiazem (CARDIZEM) 60 MG tablet TAKE 2 TABLETS (120 MG TOTAL) BY MOUTH 3 (THREE) TIMES DAILY.   [DISCONTINUED] Multiple Vitamins-Minerals (CELEBRATE MULTI-COMPLETE 59) CHEW      Allergies:   Patient has no known allergies.   Social History   Tobacco Use   Smoking status: Never   Smokeless tobacco: Never  Vaping Use   Vaping Use: Never used  Substance Use Topics   Alcohol use: Yes    Alcohol/week: 2.0 - 4.0 standard drinks    Types: 2 - 4 Glasses of wine per week    Comment: minimal    Drug use: No     Family Hx: The patient's family history includes Breast cancer in her maternal grandmother; Heart attack  (age of onset: 42) in her paternal grandfather; Heart attack (age of onset: 75) in her father; Rheumatic fever in her mother; Stroke in her mother.  ROS:   Please see the history of present illness.    All other systems are reviewed and are negative.   Prior CV studies:   The following studies were reviewed today:  Comprehensive pacemaker check in the office today  Labs/Other Tests and Data Reviewed:    EKG: Ordered today and personally reviewed shows atrial paced, ventricular sensed rhythm with a broad QRS, left bundle branch block with left axis deviation.  Recent Labs: 07/10/2020: ALT 21; BUN 13; Creatinine, Ser 0.84; Hemoglobin 13.0; Platelets 263; Potassium 4.0; Sodium 136  06/10/2019 Hemoglobin 13.4, creatinine 0.88, potassium 4.4  01/15/2021 Creatinine 0.92, potassium 4.9, ALT 21  Recent Lipid Panel No results found for: CHOL, TRIG, HDL, CHOLHDL, LDLCALC, LDLDIRECT 06/10/2019 Total cholesterol 152, HDL 61, LDL 72, triglycerides 105  01/15/2021 Cholesterol 121, HDL 40, LDL 66, triglycerides 70  Wt Readings from Last 3 Encounters:  03/04/21 201 lb 3.2 oz (91.3 kg)  09/04/20 225 lb 6.4 oz (102.2 kg)  08/20/20 228 lb (103.4 kg)     Objective:    Vital Signs:  BP 112/78    Pulse 66    Ht 5\' 2"  (1.575 m)    Wt 201 lb 3.2 oz (91.3 kg)    LMP 12/25/2014    SpO2 96%    BMI 36.80 kg/m    General: Alert, oriented x3, no distress, overly obese. Head: no evidence of trauma, PERRL, EOMI, no exophtalmos or lid lag, no myxedema, no xanthelasma; normal ears, nose and oropharynx Neck: normal jugular venous pulsations and no hepatojugular reflux; brisk carotid pulses without delay and no carotid bruits Chest: clear to auscultation, no signs of consolidation by percussion or palpation, normal fremitus, symmetrical and full respiratory excursions Cardiovascular: normal position and quality of the apical impulse, regular rhythm, normal first and second heart sounds, no murmurs, rubs or  gallops Abdomen: no tenderness or distention, no masses by palpation, no abnormal pulsatility or arterial bruits, normal bowel sounds, no hepatosplenomegaly Extremities: no clubbing, cyanosis or edema; 2+ radial, ulnar and brachial pulses bilaterally; 2+ right femoral, posterior tibial and dorsalis pedis pulses; 2+ left femoral, posterior tibial and dorsalis pedis pulses; no subclavian or femoral bruits Neurological: grossly nonfocal Psych: Normal mood and affect   ASSESSMENT & PLAN:    1. Paroxysmal atrial fibrillation (HCC)   2. SSS (sick sinus syndrome) ([place])   3. Pacemaker   4. Acquired thrombophilia ([place])   5. Morbid obesity ([place])   6. Essential hypertension       AFib/AFlutter: Excellent reduction of the burden of arrhythmia since her weight loss.  No events in over 6 months.  She is appropriately anticoagulated.  CHA2DS2-VASc at least 2 (gender, hypertension), but embolic risk elevated due to significant atriopathy/atrial standstill.  The pacemaker atrial therapies sometimes appeared to be successful in converting the arrhythmia, albeit without a "clean break" most of the time.  We will stop the diltiazem, but she can continue to use it as needed for breakthrough arrhythmia. SSS: "Atrially pacemaker dependent", atrial standstill.  Her heart rate histogram appears appropriate.  There is a little room to improve her atrial rate response settings if she complains of fatigue, but she currently appears to be satisfied with her activity level. PM: Normal device function, anticipate need for generator change out in a year.  Successfully avoiding ventricular pacing, albeit with a long AV conduction time (she was very poorly tolerant of ventricular pacing in the past). Anticoagulation: Compliant with Eliquis without bleeding problems, injuries or falls. Severe obesity: Making excellent progress with weight loss following her bariatric surgery.   HTN: Blood pressure is steadily improving as she  loses weight.  We will stop the diltiazem and continue metoprolol.  Can still use diltiazem prn for breakthrough tachyarrhythmia.  Patient Instructions  Medication Instructions:  STOP the Diltiazem  *If you need a refill on your cardiac medications before your next appointment, please call your pharmacy*   Lab Work: None ordered If you have labs (blood work) drawn today and your tests are completely normal, you will receive your results only by: [place] (if you have MyChart) OR A paper copy in the mail If you have any lab test that is abnormal or we need to change your treatment, we will call you to review the results.   Testing/Procedures: None ordered   Follow-Up: At [facility], you  and your health needs are our priority.  As part of our continuing mission to provide you with exceptional heart care, we have created designated Provider Care Teams.  These Care Teams include your primary Cardiologist (physician) and Advanced Practice Providers (APPs -  Physician Assistants and Nurse Practitioners) who all work together to provide you with the care you need, when you need it.  We recommend signing up for the patient portal called "MyChart".  Sign up information is provided on this After Visit Summary.  MyChart is used to connect with patients for Virtual Visits (Telemedicine).  Patients are able to view lab/test results, encounter notes, upcoming appointments, etc.  Non-urgent messages can be sent to your provider as well.   To learn more about what you can do with MyChart, go to [email or web address].    Your next appointment:   12 month(s)  The format for your next appointment:   In Person  Provider:   Sanda Klein, MD         Signed, Priscilla Klein, MD  03/04/2021 3:27 PM    [place]

## 2021-03-04 ENCOUNTER — Other Ambulatory Visit: Payer: Self-pay

## 2021-03-04 ENCOUNTER — Ambulatory Visit (INDEPENDENT_AMBULATORY_CARE_PROVIDER_SITE_OTHER): Payer: Commercial Managed Care - PPO | Admitting: Cardiovascular Disease

## 2021-03-04 ENCOUNTER — Encounter: Payer: Self-pay | Admitting: Cardiovascular Disease

## 2021-03-04 VITALS — BP 112/78 | HR 66 | Ht 62.0 in | Wt 201.2 lb

## 2021-03-04 DIAGNOSIS — I10 Essential (primary) hypertension: Secondary | ICD-10-CM

## 2021-03-04 DIAGNOSIS — D6869 Other thrombophilia: Secondary | ICD-10-CM | POA: Diagnosis not present

## 2021-03-04 DIAGNOSIS — Z950 Presence of cardiac pacemaker: Secondary | ICD-10-CM

## 2021-03-04 DIAGNOSIS — I495 Sick sinus syndrome: Secondary | ICD-10-CM

## 2021-03-04 DIAGNOSIS — I480 Paroxysmal atrial fibrillation: Secondary | ICD-10-CM

## 2021-03-04 NOTE — Patient Instructions (Signed)
Medication Instructions:  STOP the Diltiazem  *If you need a refill on your cardiac medications before your next appointment, please call your pharmacy*   Lab Work: None ordered If you have labs (blood work) drawn today and your tests are completely normal, you will receive your results only by: [place] (if you have MyChart) OR A paper copy in the mail If you have any lab test that is abnormal or we need to change your treatment, we will call you to review the results.   Testing/Procedures: None ordered   Follow-Up: At [facility], you and your health needs are our priority.  As part of our continuing mission to provide you with exceptional heart care, we have created designated Provider Care Teams.  These Care Teams include your primary Cardiologist (physician) and Advanced Practice Providers (APPs -  Physician Assistants and Nurse Practitioners) who all work together to provide you with the care you need, when you need it.  We recommend signing up for the patient portal called "MyChart".  Sign up information is provided on this After Visit Summary.  MyChart is used to connect with patients for Virtual Visits (Telemedicine).  Patients are able to view lab/test results, encounter notes, upcoming appointments, etc.  Non-urgent messages can be sent to your provider as well.   To learn more about what you can do with MyChart, go to [email or web address].    Your next appointment:   12 month(s)  The format for your next appointment:   In Person  Provider:   Sanda Klein, MD

## 2021-03-07 ENCOUNTER — Encounter: Payer: Self-pay | Admitting: Cardiovascular Disease

## 2021-03-14 ENCOUNTER — Ambulatory Visit (INDEPENDENT_AMBULATORY_CARE_PROVIDER_SITE_OTHER): Payer: Commercial Managed Care - PPO

## 2021-03-14 ENCOUNTER — Encounter: Payer: Commercial Managed Care - PPO | Attending: General Surgery | Admitting: Skilled Nursing Facility1

## 2021-03-14 ENCOUNTER — Other Ambulatory Visit: Payer: Self-pay

## 2021-03-14 DIAGNOSIS — I495 Sick sinus syndrome: Secondary | ICD-10-CM | POA: Diagnosis not present

## 2021-03-14 DIAGNOSIS — E669 Obesity, unspecified: Secondary | ICD-10-CM | POA: Insufficient documentation

## 2021-03-14 LAB — CUP PACEART REMOTE DEVICE CHECK
Battery Remaining Longevity: 17 mo
Battery Voltage: 2.91 V
Brady Statistic AP VP Percent: 0.56 %
Brady Statistic AP VS Percent: 99.39 %
Brady Statistic AS VP Percent: 0 %
Brady Statistic AS VS Percent: 0.04 %
Brady Statistic RA Percent Paced: 99.84 %
Brady Statistic RV Percent Paced: 0.59 %
Date Time Interrogation Session: 20230209075327
Implantable Lead Implant Date: 20150207
Implantable Lead Implant Date: 20150207
Implantable Lead Location: 753859
Implantable Lead Location: 753860
Implantable Lead Model: 5076
Implantable Lead Model: 5076
Implantable Pulse Generator Implant Date: 20150207
Lead Channel Impedance Value: 342 Ohm
Lead Channel Impedance Value: 361 Ohm
Lead Channel Impedance Value: 399 Ohm
Lead Channel Impedance Value: 513 Ohm
Lead Channel Pacing Threshold Amplitude: 0.875 V
Lead Channel Pacing Threshold Amplitude: 1.875 V
Lead Channel Pacing Threshold Pulse Width: 0.4 ms
Lead Channel Pacing Threshold Pulse Width: 0.4 ms
Lead Channel Sensing Intrinsic Amplitude: 2.75 mV
Lead Channel Sensing Intrinsic Amplitude: 2.75 mV
Lead Channel Sensing Intrinsic Amplitude: 6.75 mV
Lead Channel Sensing Intrinsic Amplitude: 6.75 mV
Lead Channel Setting Pacing Amplitude: 2 V
Lead Channel Setting Pacing Amplitude: 2.5 V
Lead Channel Setting Pacing Pulse Width: 1 ms
Lead Channel Setting Sensing Sensitivity: 0.9 mV

## 2021-03-14 NOTE — Progress Notes (Signed)
Follow-up visit:  Post-Operative sleeve Surgery   Primary concerns today: Post-operative Bariatric Surgery Nutrition Management  Surgery date: 07/09/2020 Surgery type: sleeve Start weight at [facility]: 284 Weight today: 202 pounds   Pt states she realized when she eats Jello her nails are dry. Pt states does feel she needs naps often. Pt states she is Off one of her pills for blood pressure which is really exciting. Pt states her blood sugars have been about 115-120 which she states feel high especially for not eating any carbohydrates or sugars: Dietitian educated pt on the pathophysiology of diabetes and the metabolism of carbohydrates    24hr recall: Breakfast: 2 eggs + cheese Snack: tomato Lunch: salad: cheese, chicken, olives, artichoke, lite [place] Snack: peanut butter + celery Dinner: roasted veggies + steak Snack: atkins cookie  Beverages: water, decaf tea, water + flavorings     unknown: pt states she guesses 60 ounces  Fluid intake: adequate   Medications: See List Supplementation: multi and calcium   Using straws: no Drinking while eating: no Having you been chewing well: yes Chewing/swallowing difficulties: no Changes in vision: no Changes to mood/headaches: no Hair loss/Cahnges to skin/Changes to nails: no Any difficulty focusing or concentrating: no Sweating: no Dizziness/Lightheaded: no Palpitations: no  Carbonated beverages: no N/V/D/C/GAS: no Abdominal Pain: no Dumping syndrome: no  Recent physical activity:  treadmill 20 minutes and recumbent bike 10 minutes 4 days a week; some light on the recumbent bike  Progress Towards Goal(s):  In Progress Teaching method utilized: Visual & Auditory  Demonstrated degree of understanding via: Teach Back  Readiness Level: Action Barriers to learning/adherence to lifestyle change: none identified  Encouraged patient to honor their body's internal hunger and fullness cues.  Throughout the day, check in mentally and  rate hunger. Stop eating when satisfied not full regardless of how much food is left on the plate.  Get more if still hungry 20-30 minutes later.  The key is to honor satisfaction so throughout the meal, rate fullness factor and stop when comfortably satisfied not physically full. The key is to honor hunger and fullness without any feelings of guilt or shame.  Pay attention to what the internal cues are, rather than any external factors. This will enhance the confidence you have in listening to your own body and following those internal cues enabling you to increase how often you eat when you are hungry not out of appetite and stop when you are satisfied not full.  Encouraged pt to continue to eat balanced meals inclusive of non starchy vegetables 2 times a day 7 days a week Encouraged pt to choose lean protein sources: limiting beef, pork, sausage, hotdogs, and lunch meat Encourage pt to choose healthy fats such as plant based limiting animal fats Encouraged pt to continue to drink a minium 64 fluid ounces with half being plain water to satisfy proper hydration  Why you need complex carbohydrates: Whole grains and other complex carbohydrates are required to have a healthy diet. Whole grains provide fiber which can help with blood glucose levels and help keep you satiated. Fruits and starchy vegetables provide essential vitamins and minerals required for immune function, eyesight support, brain support, bone density, wound healing and many other functions within the body. According to the current evidenced based 2020-2025 Dietary Guidelines for Americans, complex carbohydrates are part of a healthy eating pattern which is associated with a decreased risk for type 2 diabetes, cancers, and cardiovascular disease.    Goals: -create balanced meals  Teaching Method Utilized:  Visual Auditory Hands on  Demonstrated degree of understanding via:  Teach Back   Monitoring/Evaluation:  Dietary intake,  exercise, and body weight. Follow up in June

## 2021-03-19 NOTE — Progress Notes (Signed)
Remote pacemaker transmission.   

## 2021-04-14 ENCOUNTER — Other Ambulatory Visit: Payer: Self-pay | Admitting: Cardiovascular Disease

## 2021-04-15 NOTE — Telephone Encounter (Signed)
Prescription refill request for Eliquis received. ?Indication:Afib ?Last office visit:1/23 ?Scr:0.8 ?Age: 56 ?Weight:91.6 kg ? ?Prescription refilled ? ?

## 2021-06-13 ENCOUNTER — Ambulatory Visit (INDEPENDENT_AMBULATORY_CARE_PROVIDER_SITE_OTHER): Payer: Commercial Managed Care - PPO

## 2021-06-13 DIAGNOSIS — I495 Sick sinus syndrome: Secondary | ICD-10-CM

## 2021-06-13 LAB — CUP PACEART REMOTE DEVICE CHECK
Battery Remaining Longevity: 14 mo
Battery Voltage: 2.89 V
Brady Statistic AP VP Percent: 0.62 %
Brady Statistic AP VS Percent: 99.32 %
Brady Statistic AS VP Percent: 0.01 %
Brady Statistic AS VS Percent: 0.06 %
Brady Statistic RA Percent Paced: 99.79 %
Brady Statistic RV Percent Paced: 0.59 %
Date Time Interrogation Session: 20230511054044
Implantable Lead Implant Date: 20150207
Implantable Lead Implant Date: 20150207
Implantable Lead Location: 753859
Implantable Lead Location: 753860
Implantable Lead Model: 5076
Implantable Lead Model: 5076
Implantable Pulse Generator Implant Date: 20150207
Lead Channel Impedance Value: 342 Ohm
Lead Channel Impedance Value: 361 Ohm
Lead Channel Impedance Value: 399 Ohm
Lead Channel Impedance Value: 494 Ohm
Lead Channel Pacing Threshold Amplitude: 0.875 V
Lead Channel Pacing Threshold Amplitude: 1.875 V
Lead Channel Pacing Threshold Pulse Width: 0.4 ms
Lead Channel Pacing Threshold Pulse Width: 0.4 ms
Lead Channel Sensing Intrinsic Amplitude: 2.5 mV
Lead Channel Sensing Intrinsic Amplitude: 2.5 mV
Lead Channel Sensing Intrinsic Amplitude: 7.125 mV
Lead Channel Sensing Intrinsic Amplitude: 7.125 mV
Lead Channel Setting Pacing Amplitude: 2 V
Lead Channel Setting Pacing Amplitude: 2.5 V
Lead Channel Setting Pacing Pulse Width: 1 ms
Lead Channel Setting Sensing Sensitivity: 0.9 mV

## 2021-06-20 NOTE — Progress Notes (Signed)
Remote pacemaker transmission.   

## 2021-07-09 ENCOUNTER — Encounter: Payer: Commercial Managed Care - PPO | Attending: General Surgery | Admitting: Skilled Nursing Facility1

## 2021-07-09 ENCOUNTER — Encounter: Payer: Self-pay | Admitting: Skilled Nursing Facility1

## 2021-07-09 DIAGNOSIS — E669 Obesity, unspecified: Secondary | ICD-10-CM | POA: Diagnosis present

## 2021-07-09 NOTE — Progress Notes (Signed)
Follow-up visit:  Post-Operative sleeve Surgery   Primary concerns today: Post-operative Bariatric Surgery Nutrition Management  Surgery date: 07/09/2020 Surgery type: sleeve Start weight at [facility]: 284 Weight today:  pt declined  Labs: vitamin D 59.8, A1C 5.6 Pt state she dropped a medication from her cardiologist.  Pt states she has been keeping up on her exercise stating she is proud of herself for continuing.  Pt states she is getting a puppy today!  Pt states it is an ongoing battle to stay consistent for example crackers and peanut butter but feels with her husbands support she can maintain long term.    24hr recall: Breakfast: 2 eggs + cheese or lemon yogurt  Snack: tomato or other veggie + hummus Lunch: leftovers or chicken salad + 1/2 tomato and crackers Snack: peanut butter + celery or cheese and chips Dinner: roasted veggies + steak or pork chop + wild rice  Snack: atkins cookie or smoothie: banana + milk  + greek yogurt + other fruit  Beverages: water, decaf tea, water + flavorings     unknown amount: pt states she guesses 60-70 ounces  Fluid intake: adequate   Medications: See List Supplementation: multi and calcium   Using straws: no Drinking while eating: no Having you been chewing well: yes Chewing/swallowing difficulties: no Changes in vision: no Changes to mood/headaches: no Hair loss/Cahnges to skin/Changes to nails: no Any difficulty focusing or concentrating: no Sweating: no Dizziness/Lightheaded: no Palpitations: no  Carbonated beverages: no N/V/D/C/GAS: no Abdominal Pain: no Dumping syndrome: no  Recent physical activity:  treadmill 20 minutes 2.5 mph and recumbent bike 10 minutes 4 days a week; some light weights on the recumbent bike  Progress Towards Goal(s):  In Progress Teaching method utilized: Visual & Auditory  Demonstrated degree of understanding via: Teach Back  Readiness Level: Action Barriers to learning/adherence to lifestyle  change: none identified  Continued: Encouraged patient to honor their body's internal hunger and fullness cues.  Throughout the day, check in mentally and rate hunger. Stop eating when satisfied not full regardless of how much food is left on the plate.  Get more if still hungry 20-30 minutes later.  The key is to honor satisfaction so throughout the meal, rate fullness factor and stop when comfortably satisfied not physically full. The key is to honor hunger and fullness without any feelings of guilt or shame.  Pay attention to what the internal cues are, rather than any external factors. This will enhance the confidence you have in listening to your own body and following those internal cues enabling you to increase how often you eat when you are hungry not out of appetite and stop when you are satisfied not full.  Encouraged pt to continue to eat balanced meals inclusive of non starchy vegetables 2 times a day 7 days a week Encouraged pt to choose lean protein sources: limiting beef, pork, sausage, hotdogs, and lunch meat Encourage pt to choose healthy fats such as plant based limiting animal fats Encouraged pt to continue to drink a minium 64 fluid ounces with half being plain water to satisfy proper hydration  Why you need complex carbohydrates: Whole grains and other complex carbohydrates are required to have a healthy diet. Whole grains provide fiber which can help with blood glucose levels and help keep you satiated. Fruits and starchy vegetables provide essential vitamins and minerals required for immune function, eyesight support, brain support, bone density, wound healing and many other functions within the body. According to the current  evidenced based 2020-2025 Dietary Guidelines for Americans, complex carbohydrates are part of a healthy eating pattern which is associated with a decreased risk for type 2 diabetes, cancers, and cardiovascular disease.     Teaching Method Utilized:   Visual Auditory Hands on  Demonstrated degree of understanding via:  Teach Back   Monitoring/Evaluation:  Dietary intake, exercise, and body weight.  Please call or email if any questions or concerns come up throughout your journey.

## 2021-09-12 ENCOUNTER — Ambulatory Visit (INDEPENDENT_AMBULATORY_CARE_PROVIDER_SITE_OTHER): Payer: Commercial Managed Care - PPO

## 2021-09-12 DIAGNOSIS — I495 Sick sinus syndrome: Secondary | ICD-10-CM

## 2021-09-13 LAB — CUP PACEART REMOTE DEVICE CHECK
Battery Remaining Longevity: 12 mo
Battery Voltage: 2.88 V
Brady Statistic AP VP Percent: 0.63 %
Brady Statistic AP VS Percent: 99.3 %
Brady Statistic AS VP Percent: 0.01 %
Brady Statistic AS VS Percent: 0.06 %
Brady Statistic RA Percent Paced: 99.8 %
Brady Statistic RV Percent Paced: 0.62 %
Date Time Interrogation Session: 20230810061958
Implantable Lead Implant Date: 20150207
Implantable Lead Implant Date: 20150207
Implantable Lead Location: 753859
Implantable Lead Location: 753860
Implantable Lead Model: 5076
Implantable Lead Model: 5076
Implantable Pulse Generator Implant Date: 20150207
Lead Channel Impedance Value: 342 Ohm
Lead Channel Impedance Value: 361 Ohm
Lead Channel Impedance Value: 399 Ohm
Lead Channel Impedance Value: 494 Ohm
Lead Channel Pacing Threshold Amplitude: 0.875 V
Lead Channel Pacing Threshold Amplitude: 1.75 V
Lead Channel Pacing Threshold Pulse Width: 0.4 ms
Lead Channel Pacing Threshold Pulse Width: 0.4 ms
Lead Channel Sensing Intrinsic Amplitude: 3.75 mV
Lead Channel Sensing Intrinsic Amplitude: 3.75 mV
Lead Channel Sensing Intrinsic Amplitude: 7.625 mV
Lead Channel Sensing Intrinsic Amplitude: 7.625 mV
Lead Channel Setting Pacing Amplitude: 2 V
Lead Channel Setting Pacing Amplitude: 2.5 V
Lead Channel Setting Pacing Pulse Width: 1 ms
Lead Channel Setting Sensing Sensitivity: 0.9 mV

## 2021-09-21 ENCOUNTER — Other Ambulatory Visit: Payer: Self-pay | Admitting: Cardiovascular Disease

## 2021-10-14 ENCOUNTER — Other Ambulatory Visit: Payer: Self-pay | Admitting: Cardiovascular Disease

## 2021-10-14 NOTE — Progress Notes (Signed)
Remote pacemaker transmission.   

## 2021-10-14 NOTE — Telephone Encounter (Signed)
Prescription refill request for Eliquis received. Indication: PAF Last office visit: 03/04/21  M Croitoru Scr: 0.84 on 07/10/20  (Requested at upcoming OV) Age: 56 Weight: 91.3kg  Based on above findings Eliquis 5mg  twice daily is the appropriate dose.  Refill approved till OV

## 2021-10-14 NOTE — Telephone Encounter (Signed)
Prescription refill request for Eliquis received. Indication: Afib Last office visit: [place], 03/04/2021 Scr:  0.84, 07/10/2020 Age: 56 yo  Weight: 91.6 kg   Pt is overdue for blood work.

## 2021-12-12 ENCOUNTER — Ambulatory Visit (INDEPENDENT_AMBULATORY_CARE_PROVIDER_SITE_OTHER): Payer: Commercial Managed Care - PPO

## 2021-12-12 DIAGNOSIS — I495 Sick sinus syndrome: Secondary | ICD-10-CM | POA: Diagnosis not present

## 2021-12-12 LAB — CUP PACEART REMOTE DEVICE CHECK
Battery Remaining Longevity: 8 mo
Battery Voltage: 2.87 V
Brady Statistic AP VP Percent: 1.18 %
Brady Statistic AP VS Percent: 98.66 %
Brady Statistic AS VP Percent: 0.01 %
Brady Statistic AS VS Percent: 0.15 %
Brady Statistic RA Percent Paced: 99.51 %
Brady Statistic RV Percent Paced: 1.11 %
Date Time Interrogation Session: 20231109062933
Implantable Lead Connection Status: 753985
Implantable Lead Connection Status: 753985
Implantable Lead Implant Date: 20150207
Implantable Lead Implant Date: 20150207
Implantable Lead Location: 753859
Implantable Lead Location: 753860
Implantable Lead Model: 5076
Implantable Lead Model: 5076
Implantable Pulse Generator Implant Date: 20150207
Lead Channel Impedance Value: 361 Ohm
Lead Channel Impedance Value: 361 Ohm
Lead Channel Impedance Value: 399 Ohm
Lead Channel Impedance Value: 494 Ohm
Lead Channel Pacing Threshold Amplitude: 1 V
Lead Channel Pacing Threshold Amplitude: 2 V
Lead Channel Pacing Threshold Pulse Width: 0.4 ms
Lead Channel Pacing Threshold Pulse Width: 0.4 ms
Lead Channel Sensing Intrinsic Amplitude: 1.125 mV
Lead Channel Sensing Intrinsic Amplitude: 1.125 mV
Lead Channel Sensing Intrinsic Amplitude: 7.5 mV
Lead Channel Sensing Intrinsic Amplitude: 7.5 mV
Lead Channel Setting Pacing Amplitude: 2 V
Lead Channel Setting Pacing Amplitude: 2.5 V
Lead Channel Setting Pacing Pulse Width: 1 ms
Lead Channel Setting Sensing Sensitivity: 0.9 mV
Zone Setting Status: 755011

## 2021-12-20 NOTE — Progress Notes (Signed)
Remote pacemaker transmission.   

## 2022-01-02 ENCOUNTER — Encounter: Payer: Self-pay | Admitting: Cardiovascular Disease

## 2022-01-13 ENCOUNTER — Other Ambulatory Visit: Payer: Self-pay | Admitting: Cardiovascular Disease

## 2022-01-13 NOTE — Telephone Encounter (Signed)
Prescription refill request for Eliquis received. Indication:afib Last office visit:1/23 Scr:1.0 Age: 56 Weight:91.6  kg  Prescription refilled

## 2022-03-13 ENCOUNTER — Telehealth: Payer: Self-pay

## 2022-03-13 ENCOUNTER — Ambulatory Visit (INDEPENDENT_AMBULATORY_CARE_PROVIDER_SITE_OTHER): Payer: Commercial Managed Care - PPO

## 2022-03-13 DIAGNOSIS — I495 Sick sinus syndrome: Secondary | ICD-10-CM

## 2022-03-13 LAB — CUP PACEART REMOTE DEVICE CHECK
Battery Remaining Longevity: 5 mo
Battery Voltage: 2.85 V
Brady Statistic AP VP Percent: 1.15 %
Brady Statistic AP VS Percent: 98.69 %
Brady Statistic AS VP Percent: 0.01 %
Brady Statistic AS VS Percent: 0.15 %
Brady Statistic RA Percent Paced: 99.41 %
Brady Statistic RV Percent Paced: 1.09 %
Date Time Interrogation Session: 20240208071141
Implantable Lead Connection Status: 753985
Implantable Lead Connection Status: 753985
Implantable Lead Implant Date: 20150207
Implantable Lead Implant Date: 20150207
Implantable Lead Location: 753859
Implantable Lead Location: 753860
Implantable Lead Model: 5076
Implantable Lead Model: 5076
Implantable Pulse Generator Implant Date: 20150207
Lead Channel Impedance Value: 361 Ohm
Lead Channel Impedance Value: 380 Ohm
Lead Channel Impedance Value: 418 Ohm
Lead Channel Impedance Value: 494 Ohm
Lead Channel Pacing Threshold Amplitude: 0.875 V
Lead Channel Pacing Threshold Amplitude: 2.5 V
Lead Channel Pacing Threshold Pulse Width: 0.4 ms
Lead Channel Pacing Threshold Pulse Width: 0.4 ms
Lead Channel Sensing Intrinsic Amplitude: 2.875 mV
Lead Channel Sensing Intrinsic Amplitude: 2.875 mV
Lead Channel Sensing Intrinsic Amplitude: 8.25 mV
Lead Channel Sensing Intrinsic Amplitude: 8.25 mV
Lead Channel Setting Pacing Amplitude: 2 V
Lead Channel Setting Pacing Amplitude: 2.5 V
Lead Channel Setting Pacing Pulse Width: 1 ms
Lead Channel Setting Sensing Sensitivity: 0.9 mV
Zone Setting Status: 755011

## 2022-03-13 NOTE — Telephone Encounter (Signed)
Alert received from [employer]:  Scheduled remote reviewed. Normal device function.   RV threshold 2V @ 0.28ms, stable trend 1 SVT, 94min 19sec in duration, AF with rapid response 3 treated AF epiosdes, 1 of 3 pace terminated = 33%,  up to 72 bursts of ATP Burden <0.1%, Eliquis Battery estimated 70mo - route to triage Next remote to be determined  Outreach made to Pt.  Advised monthly battery checks had been scheduled.  She indicates understanding.  Due for yearly follow up with Dr. Loletha Grayer.  Will forward to scheduling.

## 2022-04-07 NOTE — Progress Notes (Signed)
Remote pacemaker transmission.   

## 2022-04-11 ENCOUNTER — Ambulatory Visit: Payer: Commercial Managed Care - PPO

## 2022-04-11 DIAGNOSIS — I495 Sick sinus syndrome: Secondary | ICD-10-CM

## 2022-04-11 LAB — CUP PACEART REMOTE DEVICE CHECK
Battery Remaining Longevity: 4 mo
Battery Voltage: 2.85 V
Brady Statistic AP VP Percent: 0.92 %
Brady Statistic AP VS Percent: 98.97 %
Brady Statistic AS VP Percent: 0.01 %
Brady Statistic AS VS Percent: 0.1 %
Brady Statistic RA Percent Paced: 99.66 %
Brady Statistic RV Percent Paced: 0.9 %
Date Time Interrogation Session: 20240308071533
Implantable Lead Connection Status: 753985
Implantable Lead Connection Status: 753985
Implantable Lead Implant Date: 20150207
Implantable Lead Implant Date: 20150207
Implantable Lead Location: 753859
Implantable Lead Location: 753860
Implantable Lead Model: 5076
Implantable Lead Model: 5076
Implantable Pulse Generator Implant Date: 20150207
Lead Channel Impedance Value: 380 Ohm
Lead Channel Impedance Value: 380 Ohm
Lead Channel Impedance Value: 437 Ohm
Lead Channel Impedance Value: 532 Ohm
Lead Channel Pacing Threshold Amplitude: 1 V
Lead Channel Pacing Threshold Amplitude: 2.375 V
Lead Channel Pacing Threshold Pulse Width: 0.4 ms
Lead Channel Pacing Threshold Pulse Width: 0.4 ms
Lead Channel Sensing Intrinsic Amplitude: 2.5 mV
Lead Channel Sensing Intrinsic Amplitude: 2.5 mV
Lead Channel Sensing Intrinsic Amplitude: 9.125 mV
Lead Channel Sensing Intrinsic Amplitude: 9.125 mV
Lead Channel Setting Pacing Amplitude: 2 V
Lead Channel Setting Pacing Amplitude: 2.5 V
Lead Channel Setting Pacing Pulse Width: 1 ms
Lead Channel Setting Sensing Sensitivity: 0.9 mV
Zone Setting Status: 755011

## 2022-04-17 ENCOUNTER — Encounter: Payer: Self-pay | Admitting: Cardiovascular Disease

## 2022-04-17 ENCOUNTER — Ambulatory Visit: Payer: Commercial Managed Care - PPO | Attending: Cardiovascular Disease | Admitting: Cardiovascular Disease

## 2022-04-17 VITALS — BP 122/84 | HR 63 | Ht 62.0 in | Wt 219.4 lb

## 2022-04-17 DIAGNOSIS — I495 Sick sinus syndrome: Secondary | ICD-10-CM | POA: Diagnosis not present

## 2022-04-17 DIAGNOSIS — Z950 Presence of cardiac pacemaker: Secondary | ICD-10-CM | POA: Diagnosis not present

## 2022-04-17 DIAGNOSIS — I480 Paroxysmal atrial fibrillation: Secondary | ICD-10-CM

## 2022-04-17 DIAGNOSIS — D6869 Other thrombophilia: Secondary | ICD-10-CM | POA: Diagnosis not present

## 2022-04-17 DIAGNOSIS — I10 Essential (primary) hypertension: Secondary | ICD-10-CM

## 2022-04-17 LAB — CBC

## 2022-04-17 NOTE — Patient Instructions (Signed)
Medication Instructions:  No changes *If you need a refill on your cardiac medications before your next appointment, please call your pharmacy*   Lab Work: CBC and BMP If you have labs (blood work) drawn today and your tests are completely normal, you will receive your results only by: [place] (if you have MyChart) OR A paper copy in the mail If you have any lab test that is abnormal or we need to change your treatment, we will call you to review the results.   Follow-Up: At [facility], you and your health needs are our priority.  As part of our continuing mission to provide you with exceptional heart care, we have created designated Provider Care Teams.  These Care Teams include your primary Cardiologist (physician) and Advanced Practice Providers (APPs -  Physician Assistants and Nurse Practitioners) who all work together to provide you with the care you need, when you need it.  We recommend signing up for the patient portal called "MyChart".  Sign up information is provided on this After Visit Summary.  MyChart is used to connect with patients for Virtual Visits (Telemedicine).  Patients are able to view lab/test results, encounter notes, upcoming appointments, etc.  Non-urgent messages can be sent to your provider as well.   To learn more about what you can do with MyChart, go to [email or web address].    Your next appointment:   6 month(s)  Provider:   Sanda Klein, MD

## 2022-04-17 NOTE — Progress Notes (Signed)
Cardiology Office Note   Date:  04/17/2022   ID:  Priscilla [name], DOB 08-01-1965, MRN [id number]  PCP:  Marda Stalker, PA-C  Cardiologist:  Sanda Klein, MD  Electrophysiologist:  None   Evaluation Performed:  Follow-Up Visit  Chief Complaint: Pacemaker follow-up, atrial flutter/fibrillation  History of Present Illness:    Priscilla [name] is a 57 y.o. female with a history of atrial flutter/atrial fibrillation with previous radiofrequency ablation followed by atrial standstill and pacemaker implantation.  She has longstanding problems with morbid obesity, hat bariatric surgery in early June 2022.  She is doing well from a cardiovascular point of view and has no complaints.  She denies chest pain or shortness of breath at rest or with activity, edema, PND, orthopnea, palpitations, dizziness, syncope, falls or bleeding problems.    Her pacemaker shows an extremely low burden of atrial fibrillation, less than 0.1%, a total of 4 hours in the last 12 months.  The longest episode was 86 minutes long.  The episodes are sometimes associated with RVR up to the 120-130 bpm range.  She has 100% atrial pacing with a good heart rate histogram.  She only has 0.9% ventricular pacing.  She has a chronically high ventricular pacing threshold that has been stable.  Her pacemaker is approaching ERI anticipated to occur in about 4 months.  Current battery voltage is 2.85 V (ERI 2.83V).  She has a dual-chamber Medtronic advisor device implanted in 2015.  She lost over 80 pounds of weight with diet and bariatric surgery, but has gained back 17 pounds in the last year.  She had a normal sleep study 2018.  She had iron deficiency anemia related to menorrhagia.  She has treated hypothyroidism.    She has a history of normal coronary arteries by previous angiography. In the past, her chest discomfort seemed to improve when beta blocker dose was reduced and her AV delay was liberalized to reduce the incidence  of ventricular pacing. Echo in June 2016 showed normal left ventricular systolic function. Right heart chambers were slightly dilated, but a sleep study performed since that time has showed no evidence of sleep apnea.Dr. Rayann Heman EP evaluation February 2017: "... significant atriopathy.  I anticipate that she will continue to have progressive atrial arrhythmias in the future.  Further ablation would carry low success and likely very little benefit.  If she has robust increase in atrial arrhythmias, could consider AAD therapy in the future...."   Past Medical History:  Diagnosis Date   Atrial fibrillation ([place]) 2015   Atrial flutter ([place]) 2015   status post a flutter ablation and subsequent Medtronic pacemaker, battery 2015   CHF (congestive heart failure) ([place])    Diabetes mellitus without complication ([place])    type 2    Dysrhythmia    atfib/at flutter    Heart murmur    History of colonoscopy 01/23/15   History of mammogram 11/13/14   Hypertension    Hypothyroidism    Obese    Presence of permanent cardiac pacemaker    Sick sinus syndrome ([place]) 08/01/2014   Dual chamber Medtronic advisa MRI conditional implanted February 2015   Past Surgical History:  Procedure Laterality Date   ATRIAL FLUTTER ABLATION  2015   in [place] N/A 01/05/2015   Procedure: TRANSVAGINAL TAPE (TVT) PROCEDURE;  Surgeon: Everett Graff, MD;  Location: [facility];  Service: Gynecology;  Laterality: N/A;   CARDIAC CATHETERIZATION  03/2013   CARDIOVERSION N/A 12/18/2015  Procedure: CARDIOVERSION;  Surgeon: Sanda Klein, MD;  Location: [facility];  Service: Cardiovascular;  Laterality: N/A;   CESAREAN SECTION     x3   CYSTOSCOPY N/A 01/05/2015   Procedure: CYSTOSCOPY;  Surgeon: Everett Graff, MD;  Location: [facility];  Service: Gynecology;  Laterality: N/A;   LAPAROSCOPIC GASTRIC SLEEVE RESECTION N/A 07/09/2020   Procedure: LAPAROSCOPIC GASTRIC SLEEVE RESECTION;  Surgeon: Kieth Brightly, Arta Bruce, MD;   Location: [facility];  Service: General;  Laterality: N/A;   PACEMAKER INSERTION  03/2013   Medtronic Advisa DR MRI implanted in [place] for sick sinus syndrome   UPPER GI ENDOSCOPY N/A 07/09/2020   Procedure: UPPER GI ENDOSCOPY;  Surgeon: Mickeal Skinner, MD;  Location: [facility];  Service: General;  Laterality: N/A;     Current Meds  Medication Sig   apixaban (ELIQUIS) 5 MG TABS tablet Take 1 tablet by mouth twice daily   BLACK ELDERBERRY PO Take 1 tablet by mouth in the morning and at bedtime. Sambucol Quick Dissolve Tablets   calcium carbonate (CALCIUM ANTACID) 500 MG chewable tablet Chew 1 tablet by mouth 3 (three) times daily.   levothyroxine (SYNTHROID, LEVOTHROID) 25 MCG tablet Take 25 mcg by mouth daily before breakfast.   Melatonin 5 MG TABS Take 5 mg by mouth at bedtime as needed (for sleep).   metoprolol tartrate (LOPRESSOR) 50 MG tablet TAKE 1/2 TABLET TWICE A DAY   Multiple Vitamins-Iron (MULTIVITAMIN PLUS IRON ADULT) TABS See admin instructions.     Allergies:   Patient has no known allergies.   Social History   Tobacco Use   Smoking status: Never   Smokeless tobacco: Never  Vaping Use   Vaping Use: Never used  Substance Use Topics   Alcohol use: Yes    Alcohol/week: 2.0 - 4.0 standard drinks of alcohol    Types: 2 - 4 Glasses of wine per week    Comment: minimal    Drug use: No     Family Hx: The patient's family history includes Breast cancer in her maternal grandmother; Heart attack (age of onset: 59) in her paternal grandfather; Heart attack (age of onset: 32) in her father; Rheumatic fever in her mother; Stroke in her mother.  ROS:   Please see the history of present illness.    All other systems are reviewed and are negative.   Prior CV studies:   The following studies were reviewed today:  Comprehensive pacemaker check in the office today  Labs/Other Tests and Data Reviewed:    EKG: Ordered today and personally reviewed shows atrial paced, ventricular  sensed rhythm with long AV delay 360 ms, broad QRS at 138 ms most closely resembling an atypical LBBB, left axis deviation, QTc 421 ms  Recent Labs: 04/17/2022: BUN 12; Creatinine, Ser 0.80; Hemoglobin WILL FOLLOW; Platelets WILL FOLLOW; Potassium 4.5; Sodium 142  06/10/2019 Hemoglobin 13.4, creatinine 0.88, potassium 4.4  01/15/2021 Creatinine 0.92, potassium 4.9, ALT 21  Recent Lipid Panel No results found for: "CHOL", "TRIG", "HDL", "CHOLHDL", "LDLCALC", "LDLDIRECT" 06/10/2019 Total cholesterol 152, HDL 61, LDL 72, triglycerides 105  01/15/2021 Cholesterol 121, HDL 40, LDL 66, triglycerides 70  Wt Readings from Last 3 Encounters:  04/17/22 219 lb 6.4 oz (99.5 kg)  03/14/21 202 lb (91.6 kg)  03/04/21 201 lb 3.2 oz (91.3 kg)     Objective:    Vital Signs:  BP 122/84 (BP Location: Left Arm, Patient Position: Sitting, Cuff Size: Large)   Pulse 63   Ht '5\' 2"'$  (1.575 m)  Wt 219 lb 6.4 oz (99.5 kg)   LMP 12/25/2014   SpO2 96%   BMI 40.13 kg/m    General: Alert, oriented x3, no distress, healthy left subclavian pacemaker site Head: no evidence of trauma, PERRL, EOMI, no exophtalmos or lid lag, no myxedema, no xanthelasma; normal ears, nose and oropharynx Neck: normal jugular venous pulsations and no hepatojugular reflux; brisk carotid pulses without delay and no carotid bruits Chest: clear to auscultation, no signs of consolidation by percussion or palpation, normal fremitus, symmetrical and full respiratory excursions Cardiovascular: normal position and quality of the apical impulse, regular rhythm, normal first and second heart sounds, no murmurs, rubs or gallops Abdomen: no tenderness or distention, no masses by palpation, no abnormal pulsatility or arterial bruits, normal bowel sounds, no hepatosplenomegaly Extremities: no clubbing, cyanosis or edema; 2+ radial, ulnar and brachial pulses bilaterally; 2+ right femoral, posterior tibial and dorsalis pedis pulses; 2+ left  femoral, posterior tibial and dorsalis pedis pulses; no subclavian or femoral bruits Neurological: grossly nonfocal Psych: Normal mood and affect     ASSESSMENT & PLAN:    1. Paroxysmal atrial fibrillation (HCC)   2. SSS (sick sinus syndrome) ([place])   3. Acquired thrombophilia ([place])   4. Pacemaker   5. Morbid obesity ([place])   6. Essential hypertension       AFib/AFlutter: Very low AFib prevalence.  She is appropriately anticoagulated.  CHA2DS2-VASc at least 2 (gender, hypertension), but embolic risk elevated due to significant atriopathy/atrial standstill.   SSS: "Atrially pacemaker dependent", atrial standstill.  Ood histogram distribution. [id number] ERI. Check battery monthly.   Successfully avoiding ventricular pacing, albeit with a long AV conduction time (she was very poorly tolerant of ventricular pacing in the past). Anticoagulation: no bleeding issues. Severe obesity: has gained some weight after initial weight loss with bariatric surgery.   HTN: good control.  Patient Instructions  Medication Instructions:  No changes *If you need a refill on your cardiac medications before your next appointment, please call your pharmacy*   Lab Work: CBC and BMP If you have labs (blood work) drawn today and your tests are completely normal, you will receive your results only by: [place] (if you have MyChart) OR A paper copy in the mail If you have any lab test that is abnormal or we need to change your treatment, we will call you to review the results.   Follow-Up: At [facility], you and your health needs are our priority.  As part of our continuing mission to provide you with exceptional heart care, we have created designated Provider Care Teams.  These Care Teams include your primary Cardiologist (physician) and Advanced Practice Providers (APPs -  Physician Assistants and Nurse Practitioners) who all work together to provide you with the care you need, when  you need it.  We recommend signing up for the patient portal called "MyChart".  Sign up information is provided on this After Visit Summary.  MyChart is used to connect with patients for Virtual Visits (Telemedicine).  Patients are able to view lab/test results, encounter notes, upcoming appointments, etc.  Non-urgent messages can be sent to your provider as well.   To learn more about what you can do with MyChart, go to [email or web address].    Your next appointment:   6 month(s)  Provider:   Sanda Klein, MD        Signed, Sanda Klein, MD  04/17/2022 9:17 PM    [place]

## 2022-04-18 LAB — CBC
Hematocrit: 42.9 % (ref 34.0–46.6)
Hemoglobin: 13.9 g/dL (ref 11.1–15.9)
MCH: 29.3 pg (ref 26.6–33.0)
MCHC: 32.4 g/dL (ref 31.5–35.7)
MCV: 91 fL (ref 79–97)
Platelets: 264 10*3/uL (ref 150–450)
RBC: 4.74 x10E6/uL (ref 3.77–5.28)
RDW: 12.4 % (ref 11.7–15.4)
WBC: 7.8 10*3/uL (ref 3.4–10.8)

## 2022-04-18 LAB — BASIC METABOLIC PANEL
BUN/Creatinine Ratio: 15 (ref 9–23)
BUN: 12 mg/dL (ref 6–24)
CO2: 26 mmol/L (ref 20–29)
Calcium: 9.1 mg/dL (ref 8.7–10.2)
Chloride: 104 mmol/L (ref 96–106)
Creatinine, Ser: 0.8 mg/dL (ref 0.57–1.00)
Glucose: 97 mg/dL (ref 70–99)
Potassium: 4.5 mmol/L (ref 3.5–5.2)
Sodium: 142 mmol/L (ref 134–144)
eGFR: 85 mL/min/{1.73_m2} (ref >59)

## 2022-05-12 ENCOUNTER — Ambulatory Visit (INDEPENDENT_AMBULATORY_CARE_PROVIDER_SITE_OTHER): Payer: Commercial Managed Care - PPO

## 2022-05-12 DIAGNOSIS — I480 Paroxysmal atrial fibrillation: Secondary | ICD-10-CM

## 2022-05-12 NOTE — Progress Notes (Signed)
Remote pacemaker transmission.   

## 2022-05-13 LAB — CUP PACEART REMOTE DEVICE CHECK
Battery Remaining Longevity: 3 mo
Battery Voltage: 2.84 V
Brady Statistic AP VP Percent: 0.9 %
Brady Statistic AP VS Percent: 99.04 %
Brady Statistic AS VP Percent: 0.01 %
Brady Statistic AS VS Percent: 0.05 %
Brady Statistic RA Percent Paced: 99.83 %
Brady Statistic RV Percent Paced: 0.88 %
Date Time Interrogation Session: 20240408062902
Implantable Lead Connection Status: 753985
Implantable Lead Connection Status: 753985
Implantable Lead Implant Date: 20150207
Implantable Lead Implant Date: 20150207
Implantable Lead Location: 753859
Implantable Lead Location: 753860
Implantable Lead Model: 5076
Implantable Lead Model: 5076
Implantable Pulse Generator Implant Date: 20150207
Lead Channel Impedance Value: 361 Ohm
Lead Channel Impedance Value: 380 Ohm
Lead Channel Impedance Value: 437 Ohm
Lead Channel Impedance Value: 532 Ohm
Lead Channel Pacing Threshold Amplitude: 0.875 V
Lead Channel Pacing Threshold Amplitude: 2.25 V
Lead Channel Pacing Threshold Pulse Width: 0.4 ms
Lead Channel Pacing Threshold Pulse Width: 0.4 ms
Lead Channel Sensing Intrinsic Amplitude: 2.5 mV
Lead Channel Sensing Intrinsic Amplitude: 2.5 mV
Lead Channel Sensing Intrinsic Amplitude: 8.5 mV
Lead Channel Sensing Intrinsic Amplitude: 8.5 mV
Lead Channel Setting Pacing Amplitude: 2 V
Lead Channel Setting Pacing Amplitude: 2.5 V
Lead Channel Setting Pacing Pulse Width: 1 ms
Lead Channel Setting Sensing Sensitivity: 0.9 mV
Zone Setting Status: 755011

## 2022-06-12 ENCOUNTER — Ambulatory Visit (INDEPENDENT_AMBULATORY_CARE_PROVIDER_SITE_OTHER): Payer: Commercial Managed Care - PPO

## 2022-06-12 DIAGNOSIS — I495 Sick sinus syndrome: Secondary | ICD-10-CM

## 2022-06-12 LAB — CUP PACEART REMOTE DEVICE CHECK
Battery Remaining Longevity: 2 mo
Battery Voltage: 2.84 V
Brady Statistic AP VP Percent: 1.25 %
Brady Statistic AP VS Percent: 98.37 %
Brady Statistic AS VP Percent: 0.02 %
Brady Statistic AS VS Percent: 0.36 %
Brady Statistic RA Percent Paced: 99.07 %
Brady Statistic RV Percent Paced: 1.16 %
Date Time Interrogation Session: 20240509060337
Implantable Lead Connection Status: 753985
Implantable Lead Connection Status: 753985
Implantable Lead Implant Date: 20150207
Implantable Lead Implant Date: 20150207
Implantable Lead Location: 753859
Implantable Lead Location: 753860
Implantable Lead Model: 5076
Implantable Lead Model: 5076
Implantable Pulse Generator Implant Date: 20150207
Lead Channel Impedance Value: 342 Ohm
Lead Channel Impedance Value: 361 Ohm
Lead Channel Impedance Value: 418 Ohm
Lead Channel Impedance Value: 494 Ohm
Lead Channel Pacing Threshold Amplitude: 1 V
Lead Channel Pacing Threshold Amplitude: 2 V
Lead Channel Pacing Threshold Pulse Width: 0.4 ms
Lead Channel Pacing Threshold Pulse Width: 0.4 ms
Lead Channel Sensing Intrinsic Amplitude: 3.125 mV
Lead Channel Sensing Intrinsic Amplitude: 3.125 mV
Lead Channel Sensing Intrinsic Amplitude: 7.375 mV
Lead Channel Sensing Intrinsic Amplitude: 7.375 mV
Lead Channel Setting Pacing Amplitude: 2 V
Lead Channel Setting Pacing Amplitude: 2.5 V
Lead Channel Setting Pacing Pulse Width: 1 ms
Lead Channel Setting Sensing Sensitivity: 0.9 mV
Zone Setting Status: 755011
Zone Setting Status: 755011

## 2022-06-19 NOTE — Progress Notes (Signed)
Remote pacemaker transmission.   

## 2022-06-19 NOTE — Addendum Note (Signed)
Addended by: Geralyn Flash D on: 06/19/2022 12:37 PM   Modules accepted: Level of Service

## 2022-06-27 ENCOUNTER — Other Ambulatory Visit: Payer: Self-pay | Admitting: Cardiovascular Disease

## 2022-06-27 ENCOUNTER — Telehealth: Payer: Self-pay | Admitting: Emergency Medicine

## 2022-06-27 NOTE — Telephone Encounter (Signed)
Gave the pt Dr [employer]. She verbalized understanding and is aware of scheduled appt.

## 2022-06-27 NOTE — Telephone Encounter (Signed)
Prescription refill request for Eliquis received. Indication:afib Last office visit:3/24 Scr:0.8 Age: 57 Weight:99.5  kg  Prescription refilled

## 2022-06-27 NOTE — Telephone Encounter (Signed)
Called to let pt know that Dr Royann Shivers would like pt to be seen in 2-3 months. Appt made for 08/28/22 at 08:40.   Left message with call back number.

## 2022-07-02 NOTE — Progress Notes (Signed)
Remote pacemaker transmission.   

## 2022-07-09 LAB — LAB REPORT - SCANNED
A1c: 5.6
EGFR: 81

## 2022-07-14 ENCOUNTER — Ambulatory Visit (INDEPENDENT_AMBULATORY_CARE_PROVIDER_SITE_OTHER): Payer: Commercial Managed Care - PPO

## 2022-07-14 DIAGNOSIS — I495 Sick sinus syndrome: Secondary | ICD-10-CM

## 2022-07-15 ENCOUNTER — Telehealth: Payer: Self-pay

## 2022-07-15 LAB — CUP PACEART REMOTE DEVICE CHECK
Battery Remaining Longevity: 1 mo
Battery Voltage: 2.83 V
Brady Statistic AP VP Percent: 0.63 %
Brady Statistic AP VS Percent: 99.32 %
Brady Statistic AS VP Percent: 0.01 %
Brady Statistic AS VS Percent: 0.05 %
Brady Statistic RA Percent Paced: 99.69 %
Brady Statistic RV Percent Paced: 0.59 %
Date Time Interrogation Session: 20240610051227
Implantable Lead Connection Status: 753985
Implantable Lead Connection Status: 753985
Implantable Lead Implant Date: 20150207
Implantable Lead Implant Date: 20150207
Implantable Lead Location: 753859
Implantable Lead Location: 753860
Implantable Lead Model: 5076
Implantable Lead Model: 5076
Implantable Pulse Generator Implant Date: 20150207
Lead Channel Impedance Value: 361 Ohm
Lead Channel Impedance Value: 380 Ohm
Lead Channel Impedance Value: 418 Ohm
Lead Channel Impedance Value: 513 Ohm
Lead Channel Pacing Threshold Amplitude: 0.875 V
Lead Channel Pacing Threshold Amplitude: 2.125 V
Lead Channel Pacing Threshold Pulse Width: 0.4 ms
Lead Channel Pacing Threshold Pulse Width: 0.4 ms
Lead Channel Sensing Intrinsic Amplitude: 2.125 mV
Lead Channel Sensing Intrinsic Amplitude: 2.125 mV
Lead Channel Sensing Intrinsic Amplitude: 7.625 mV
Lead Channel Sensing Intrinsic Amplitude: 7.625 mV
Lead Channel Setting Pacing Amplitude: 2 V
Lead Channel Setting Pacing Amplitude: 2.5 V
Lead Channel Setting Pacing Pulse Width: 1 ms
Lead Channel Setting Sensing Sensitivity: 0.9 mV
Zone Setting Status: 755011
Zone Setting Status: 755011

## 2022-07-15 NOTE — Telephone Encounter (Signed)
Device reached ERI. Spoke with patient and informed that we will be in touch to schedule an appointment with EP.

## 2022-07-16 NOTE — Telephone Encounter (Signed)
Needs to be seen sooner.  Will try to get her into see Dr. Salena Saner when he gets back.  However, I would also keep the surgery EP team apprised becauSE she may not be able to wait.  Attached to EP team..  Bryan Lemma, MD

## 2022-07-21 ENCOUNTER — Telehealth: Payer: Self-pay | Admitting: Emergency Medicine

## 2022-07-21 NOTE — Telephone Encounter (Addendum)
Sent fax requesting pt's most recent lab work.   Needs a BMET and CBC w/in 30 days for Pacemaker Generator Change out

## 2022-07-21 NOTE — Telephone Encounter (Signed)
Called pt and went over the instructions below. She states that she recently got lab work done at [facility] on 07/09/22. I will request lab work and pt is bringing a copy of the lab work to the office when she picks up the CHG wash. (Either today or tomorrow)  I will leave the scrub and a copy of these instructions at the front desk.    Implantable Device Instructions    Priscilla [name]  07/21/2022  You are scheduled for a PPM generator change on Thursday, June 20 with Dr. Rachelle Hora Croitoru.  1. Pre procedure Lab testing:  today or tomorrow   (pt states she got lab work at an appt 07/09/22- She will bring a copy of lab work to the office).   2. Please arrive at the [facility] (Main Entrance A) at [facility]: [address] [place], [place] [address] at 12:00 PM (This time is 1.5 hour(s) before your procedure to ensure your preparation). Free valet parking service is available. You will check in at ADMITTING. The support person will be asked to wait in the waiting room.  It is OK to have someone drop you off and come back when you are ready to be discharged.          Special note: Every effort is made to have your procedure done on time. Please understand that emergencies sometimes delay  scheduled procedures.  3.  No eating or drinking after midnight prior to procedure.     4.  Medication instructions:  On the morning of your procedure Do not take Eliquis. Hold Eliquis for 3 doses prior to procedure. Your last dose will be Tuesday night 07/22/22 (pm dose)      !!IF ANY NEW MEDICATIONS ARE STARTED AFTER TODAY, PLEASE NOTIFY YOUR PROVIDER AS SOON AS POSSIBLE!!  FYI: Medications such as Semaglutide (Ozempic, [place]), Tirzepatide (Mounjaro, Zepbound), Dulaglutide (Trulicity), etc ("GLP1 agonists") AND Canagliflozin (Invokana), Dapagliflozin (Farxiga), Empagliflozin (Jardiance), Ertugliflozin (Steglatro), Bexagliflozin [employer]) or any combination with one of these drugs such as Invokamet  (Canagliflozin/Metformin), Synjardy (Empagliflozin/Metformin), etc ("SGLT2 inhibitors") must be held around the time of a procedure. This is not a comprehensive list of all of these drugs. Please review all of your medications and talk to your provider if you take any one of these. If you are not sure, ask your provider.   5.  The night before your procedure and the morning of your procedure scrub your neck/chest with CHG surgical scrub.  See instruction letter.  6. Plan to go home the same day, you will only stay overnight if medically necessary. 7. You MUST have a responsible adult to drive you home. 8. An adult MUST be with you the first 24 hours after you arrive home. 9. Bring a current list of your medications, and the last time and date medication taken. 10. Bring ID and current insurance cards. 11. Please wear clothes that are easy to get on and off and wear slip-on shoes.    You will follow up with the [facility] Device clinic 10-14 days after your procedure.  You will follow up with Dr. Rachelle Hora Croitoru 91 days after your procedure.  These appointments will be made for you.   * If you have ANY questions after you get home, please call the office at [phone] or send a MyChart message.  FYI: For your safety, and to allow [place] to monitor your vital signs accurately during the surgery/procedure we request that if you have  artificial nails, gel coating, SNS etc. Please have those removed prior to your surgery/procedure. Not having the nail coverings /polish removed may result in cancellation or delay of your surgery/procedure.    [facility] - Preparing For Surgery    Before surgery, you can play an important role. Because skin is not sterile, your skin needs to be as free of germs as possible. You can reduce the number of germs on your skin by washing with CHG (chlorahexidine gluconate) Soap before surgery.  CHG is an antiseptic cleaner which kills germs and bonds with the skin to continue  killing germs even after washing.  Please do not use if you have an allergy to CHG or antibacterial soaps.  If your skin becomes reddened/irritated stop using the CHG.   Do not shave (including legs and underarms) for at least 48 hours prior to first CHG shower.  It is OK to shave your face.  Please follow these instructions carefully:  1.  Shower the night before surgery and the morning of surgery with CHG.  2.  If you choose to wash your hair, wash your hair first as usual with your normal shampoo.  3.  After you shampoo, rinse your hair and body thoroughly to remove the shampoo.  4.  Use CHG as you would any other liquid soap.  You can apply CHG directly to the skin and wash gently with a clean washcloth. 5.  Apply the CHG Soap to your body ONLY FROM THE NECK DOWN.  Do not use on open wounds or open sores.  Avoid contact with your eyes, ears, mouth and genitals (private parts).  Wash genitals (private parts) with your normal soap.  6.  Wash thoroughly, paying special attention to the area where your surgery will be performed.  7.  Thoroughly rinse your body with warm water from the neck down.   8.  DO NOT shower/wash with your normal soap after using and rinsing off the CHG soap.  9.  Pat yourself dry with a clean towel.           10.  Wear clean pajamas.           11.  Place clean sheets on your bed the night of your first shower and do not sleep with pets.  Day of Surgery: Do not apply any deodorants/lotions.  Please wear clean clothes to the hospital/surgery center.

## 2022-07-24 ENCOUNTER — Ambulatory Visit (HOSPITAL_COMMUNITY)
Admission: RE | Admit: 2022-07-24 | Discharge: 2022-07-24 | Disposition: A | Discharge status: Home / Self Care | Payer: Commercial Managed Care - PPO | Attending: Cardiovascular Disease | Admitting: Cardiovascular Disease

## 2022-07-24 ENCOUNTER — Other Ambulatory Visit: Payer: Self-pay

## 2022-07-24 ENCOUNTER — Ambulatory Visit (HOSPITAL_COMMUNITY)
Admission: RE | Disposition: A | Discharge status: Home / Self Care | Payer: Self-pay | Source: Home / Self Care | Attending: Cardiovascular Disease

## 2022-07-24 DIAGNOSIS — Z950 Presence of cardiac pacemaker: Secondary | ICD-10-CM

## 2022-07-24 DIAGNOSIS — Z45010 Encounter for checking and testing of cardiac pacemaker pulse generator [battery]: Secondary | ICD-10-CM

## 2022-07-24 DIAGNOSIS — I480 Paroxysmal atrial fibrillation: Secondary | ICD-10-CM | POA: Diagnosis not present

## 2022-07-24 DIAGNOSIS — Z9884 Bariatric surgery status: Secondary | ICD-10-CM | POA: Diagnosis not present

## 2022-07-24 DIAGNOSIS — I4892 Unspecified atrial flutter: Secondary | ICD-10-CM | POA: Insufficient documentation

## 2022-07-24 DIAGNOSIS — I495 Sick sinus syndrome: Secondary | ICD-10-CM | POA: Diagnosis not present

## 2022-07-24 DIAGNOSIS — Z6841 Body Mass Index (BMI) 40.0 and over, adult: Secondary | ICD-10-CM | POA: Insufficient documentation

## 2022-07-24 HISTORY — PX: PPM GENERATOR CHANGEOUT: EP1233

## 2022-07-24 SURGERY — PPM GENERATOR CHANGEOUT

## 2022-07-24 MED ORDER — CHLORHEXIDINE GLUCONATE 4 % EX SOLN
4.0000 | Freq: Once | CUTANEOUS | Status: DC
  Filled 2022-07-24: qty 60

## 2022-07-24 MED ORDER — MIDAZOLAM HCL 5 MG/5ML IJ SOLN
INTRAMUSCULAR | Status: DC | PRN
  Administered 2022-07-24 (×2): 1 mg via INTRAVENOUS

## 2022-07-24 MED ORDER — ACETAMINOPHEN 325 MG PO TABS
325.0000 mg | ORAL_TABLET | ORAL | Status: DC | PRN
  Administered 2022-07-24: 650 mg via ORAL
  Filled 2022-07-24: qty 2

## 2022-07-24 MED ORDER — ONDANSETRON HCL 4 MG/2ML IJ SOLN: 4.0000 mg | Freq: Four times a day (QID) | INTRAMUSCULAR | Status: DC | PRN

## 2022-07-24 MED ORDER — HEPARIN (PORCINE) IN NACL 1000-0.9 UT/500ML-% IV SOLN: INTRAVENOUS | Status: DC | PRN

## 2022-07-24 MED ORDER — FENTANYL CITRATE (PF) 100 MCG/2ML IJ SOLN
INTRAMUSCULAR | Status: DC | PRN
  Administered 2022-07-24 (×2): 25 ug via INTRAVENOUS

## 2022-07-24 MED ORDER — SODIUM CHLORIDE 0.9 % IV SOLN: 80.0000 mg | INTRAVENOUS | Status: AC

## 2022-07-24 MED ORDER — SODIUM CHLORIDE 0.9 % IV SOLN: INTRAVENOUS | Status: DC

## 2022-07-24 MED ORDER — LIDOCAINE HCL (PF) 1 % IJ SOLN
INTRAMUSCULAR | Status: DC | PRN
  Administered 2022-07-24: 30 mL

## 2022-07-24 MED ORDER — CEFAZOLIN SODIUM-DEXTROSE 2-4 GM/100ML-% IV SOLN: 2.0000 g | INTRAVENOUS | Status: DC

## 2022-07-24 MED ORDER — POVIDONE-IODINE 10 % EX SWAB: 2.0000 | Freq: Once | CUTANEOUS | Status: DC

## 2022-07-24 MED ORDER — FENTANYL CITRATE (PF) 100 MCG/2ML IJ SOLN
INTRAMUSCULAR | Status: AC
  Filled 2022-07-24: qty 2

## 2022-07-24 MED ORDER — SODIUM CHLORIDE 0.9 % IV SOLN
INTRAVENOUS | Status: AC
  Administered 2022-07-24: 80 mg
  Filled 2022-07-24: qty 2

## 2022-07-24 MED ORDER — SODIUM CHLORIDE 0.9% FLUSH: 3.0000 mL | INTRAVENOUS | Status: DC | PRN

## 2022-07-24 MED ORDER — SODIUM CHLORIDE 0.9 % IV SOLN: INTRAVENOUS | Status: DC | PRN

## 2022-07-24 MED ORDER — APIXABAN 5 MG PO TABS: 5.0000 mg | ORAL_TABLET | Freq: Two times a day (BID) | ORAL | 1 refills | Status: DC

## 2022-07-24 MED ORDER — SODIUM CHLORIDE 0.9% FLUSH: 3.0000 mL | Freq: Two times a day (BID) | INTRAVENOUS | Status: DC

## 2022-07-24 MED ORDER — MIDAZOLAM HCL 2 MG/2ML IJ SOLN
INTRAMUSCULAR | Status: AC
  Filled 2022-07-24: qty 2

## 2022-07-24 MED ORDER — CEFAZOLIN SODIUM-DEXTROSE 2-4 GM/100ML-% IV SOLN
INTRAVENOUS | Status: AC
  Administered 2022-07-24: 2 g
  Filled 2022-07-24: qty 100

## 2022-07-24 SURGICAL SUPPLY — 7 items
CABLE SURGICAL S-101-97-12 (CABLE) ×1 IMPLANT
IPG PACE AZUR XT DR MRI W1DR01 (Pacemaker) IMPLANT
PACE AZURE XT DR MRI W1DR01 (Pacemaker) ×1 IMPLANT
PAD DEFIB RADIO PHYSIO CONN (PAD) ×1 IMPLANT
POUCH AIGIS-R ANTIBACT PPM (Mesh General) ×1 IMPLANT
POUCH AIGIS-R ANTIBACT PPM MED (Mesh General) IMPLANT
TRAY PACEMAKER INSERTION (PACKS) ×1 IMPLANT

## 2022-07-24 NOTE — Discharge Instructions (Addendum)

## 2022-07-24 NOTE — Op Note (Signed)
Procedure report  Procedure performed:  1. Dual chamber generator changeout  2. Light sedation  Reason for procedure:  1. Device generator at elective replacement interval  Procedure performed by:  Thurmon Fair, MD  Complications:  None  Estimated blood loss:  <5 mL  Medications administered during procedure:  Ancef 2 g intravenously,  lidocaine 1% 30 mL locally, fentanyl 50 mcg intravenously, Versed 2 mg intravenously Device details:   New Generator Medtronic Azure XT DR model number [id number], serial number [id number] Right atrial lead (chronic) Medtronic, model number [id number], serial number [id number] (implanted 03/12/2013) Right ventricular lead (chronic)  Medtronic, model number [id number], serial number [id number] (implanted 03/12/2013)  Explanted generator Medtronic Advisa,  model number [id number], serial number  [id number] (implanted 03/12/2013)  Procedure details:  After the risks and benefits of the procedure were discussed the patient provided informed consent. She was brought to the cardiac catheter lab in the fasting state. The patient was prepped and draped in usual sterile fashion. Local anesthesia with 1% lidocaine was administered to to the left infraclavicular area. A 5-6cm horizontal incision was made parallel with and 2-3 cm caudal to the left clavicle, in the area of an old scar. Using minimal electrocautery and mostly sharp and blunt dissection the prepectoral pocket was opened carefully to avoid injury to the loops of chronic leads. Extensive dissection was necessary, since the pocket was quite deep. The device was explanted. The pocket was carefully inspected for hemostasis and flushed with copious amounts of antibiotic solution.  The leads were disconnected from the old generator and attached to the new generator was connected to the chronic leads, with appropriate pacing noted. Telemetry testing of the lead parameters later showed values similar to pre-procedure.  The  entire system was then carefully inserted in the pocket with care been taking that the leads and device assumed a comfortable position without pressure on the incision. Great care was taken that the leads be located deep to the generator. The pocket was then closed in layers using 2 layers of 2-0 Vicryl, one layer of 3-0 Vicryl and cutaneous steristrips after which a sterile dressing was applied.   At the end of the procedure the following lead parameters were encountered:   Right atrial lead sensed P waves none detected, impedance 380 ohms, threshold 1.0 at 0.4 ms pulse width.  Right ventricular lead sensed R waves 7.5  mV, impedance 513 ohms, threshold 1.75 at 0.6 ms pulse width.  Thurmon Fair, MD, [facility] [phone] office [phone] pager

## 2022-07-24 NOTE — H&P (Signed)
Cardiology Admission History and Physical   Patient ID: Priscilla [name] MRN: [id number]; DOB: [birth year]   Admission date: 07/24/2022  PCP:  Jarrett Soho, PA-C   [facility] Providers Cardiologist:  Thurmon Fair, MD        Chief Complaint:  pacemaker battery depletion  Patient Profile:   Priscilla [name] is a 57 y.o. female with SSS who is being seen 07/24/2022 for the evaluation of pacemaker battery depletion.  History of Present Illness:   Priscilla [name] is a 57 y.o. female with a history of atrial flutter/atrial fibrillation with previous radiofrequency ablation followed by atrial standstill and pacemaker implantation.  She has longstanding problems with morbid obesity, s/p bariatric surgery in early June 2022.   Her dual chamber Medtronic Adapta pacemaker reached ERI a few days ago. She has avery low burden of paroxysmal atrial fibrillation. She has virtually 100% A paced - V sensed rhythm.  Past Medical History:  Diagnosis Date   Atrial fibrillation (HCC) 2015   Atrial flutter (HCC) 2015   status post a flutter ablation and subsequent Medtronic pacemaker, battery 2015   CHF (congestive heart failure) (HCC)    Diabetes mellitus without complication (HCC)    type 2    Dysrhythmia    atfib/at flutter    Heart murmur    History of colonoscopy 01/23/15   History of mammogram 11/13/14   Hypertension    Hypothyroidism    Obese    Presence of permanent cardiac pacemaker    Sick sinus syndrome (HCC) 08/01/2014   Dual chamber Medtronic advisa MRI conditional implanted February 2015    Past Surgical History:  Procedure Laterality Date   ATRIAL FLUTTER ABLATION  2015   in [place]   BLADDER SUSPENSION N/A 01/05/2015   Procedure: TRANSVAGINAL TAPE (TVT) PROCEDURE;  Surgeon: Osborn Coho, MD;  Location: [facility];  Service: Gynecology;  Laterality: N/A;   CARDIAC CATHETERIZATION  03/2013   CARDIOVERSION N/A 12/18/2015   Procedure: CARDIOVERSION;  Surgeon: Thurmon Fair, MD;  Location: [facility];  Service: Cardiovascular;  Laterality: N/A;   CESAREAN SECTION     x3   CYSTOSCOPY N/A 01/05/2015   Procedure: CYSTOSCOPY;  Surgeon: Osborn Coho, MD;  Location: [facility];  Service: Gynecology;  Laterality: N/A;   LAPAROSCOPIC GASTRIC SLEEVE RESECTION N/A 07/09/2020   Procedure: LAPAROSCOPIC GASTRIC SLEEVE RESECTION;  Surgeon: Sheliah Hatch, De Blanch, MD;  Location: [facility];  Service: General;  Laterality: N/A;   PACEMAKER INSERTION  03/2013   Medtronic Advisa DR MRI implanted in [place] for sick sinus syndrome   UPPER GI ENDOSCOPY N/A 07/09/2020   Procedure: UPPER GI ENDOSCOPY;  Surgeon: Sheliah Hatch De Blanch, MD;  Location: [facility];  Service: General;  Laterality: N/A;     Medications Prior to Admission: Prior to Admission medications   Medication Sig Start Date End Date Taking? Authorizing Provider  apixaban (ELIQUIS) 5 MG TABS tablet Take 1 tablet by mouth twice daily 06/27/22  Yes Narely Nobles, MD  calcium carbonate (CALCIUM ANTACID) 500 MG chewable tablet Chew 1 tablet by mouth 3 (three) times daily.   Yes [provider]  levothyroxine (SYNTHROID, LEVOTHROID) 25 MCG tablet Take 25 mcg by mouth daily before breakfast. 05/22/14  Yes [provider]  Melatonin 5 MG TABS Take 5 mg by mouth at bedtime as needed (for sleep).   Yes [provider]  metoprolol tartrate (LOPRESSOR) 50 MG tablet TAKE 1/2 TABLET TWICE A DAY 09/23/21  Yes Marley Charlot, Rachelle Hora, MD  Multiple Vitamins-Iron (MULTIVITAMIN PLUS IRON ADULT) TABS Take 1 tablet by mouth daily.   Yes [provider]  BLACK ELDERBERRY PO Take 1 tablet by mouth 2 (two) times daily as needed (In the winter). Sambucol Quick Dissolve Tablets    [provider]     Allergies:   No Known Allergies  Social History:   Social History   Socioeconomic History   Marital status: Married    Spouse name: Not on file   Number of children: Not on file   Years of education: Not on  file   Highest education level: Not on file  Occupational History   Not on file  Tobacco Use   Smoking status: Never   Smokeless tobacco: Never  Vaping Use   Vaping Use: Never used  Substance and Sexual Activity   Alcohol use: Yes    Alcohol/week: 2.0 - 4.0 standard drinks of alcohol    Types: 2 - 4 Glasses of wine per week    Comment: minimal    Drug use: No   Sexual activity: Yes    Birth control/protection: Surgical  Other Topics Concern   Not on file  Social History Narrative   Not on file   Social Determinants of Health   Financial Resource Strain: Not on file  Food Insecurity: Not on file  Transportation Needs: Not on file  Physical Activity: Not on file  Stress: Not on file  Social Connections: Not on file  Intimate Partner Violence: Not on file    Family History:   The patient's family history includes Breast cancer in her maternal grandmother; Heart attack (age of onset: 74) in her paternal grandfather; Heart attack (age of onset: 80) in her father; Rheumatic fever in her mother; Stroke in her mother.    ROS:  Please see the history of present illness.  All other ROS reviewed and negative.     Physical Exam/Data:   Vitals:   07/24/22 1218  BP: (!) 143/87  Pulse: 61  Resp: 18  Temp: 98.8 F (37.1 C)  TempSrc: Temporal  SpO2: 98%  Weight: 102.1 kg  Height: 5\' 2"  (1.575 m)   No intake or output data in the 24 hours ending 07/24/22 1250    07/24/2022   12:18 PM 04/17/2022    9:08 AM 03/14/2021    1:52 PM  Last 3 Weights  Weight (lbs) 225 lb 219 lb 6.4 oz 202 lb  Weight (kg) 102.059 kg 99.519 kg 91.627 kg     Body mass index is 41.15 kg/m.  General:  Well nourished, well developed, in no acute distressobese, healthy PM site L subclavian area HEENT: normal Neck: no JVD Vascular: No carotid bruits; Distal pulses 2+ bilaterally   Cardiac:  normal S1, S2; RRR; no murmur  Lungs:  clear to auscultation bilaterally, no wheezing, rhonchi or rales  Abd:  soft, nontender, no hepatomegaly  Ext: no edema Musculoskeletal:  No deformities, BUE and BLE strength normal and equal Skin: warm and dry  Neuro:  CNs 2-12 intact, no focal abnormalities noted Psych:  Normal affect    EKG:  The ECG that was done 04/17/2022 was personally reviewed and demonstrates A paced V sensed rhythm, atypical LBBB  Relevant CV Studies:   Laboratory Data:  High Sensitivity Troponin:  No results for input(s): "TROPONINIHS" in the last 720 hours.    ChemistryNo results for input(s): "NA", "K", "CL", "CO2", "GLUCOSE", "BUN", "CREATININE", "CALCIUM", "MG", "GFRNONAA", "GFRAA", "ANIONGAP" in the last 168 hours.  No results for input(s): "PROT", "ALBUMIN", "AST", "ALT", "ALKPHOS", "BILITOT" in the last 168 hours. Lipids No results for input(s): "CHOL", "TRIG", "HDL", "LABVLDL", "LDLCALC", "CHOLHDL" in the last 168 hours. HematologyNo results for input(s): "WBC", "RBC", "HGB", "HCT", "MCV", "MCH", "MCHC", "RDW", "PLT" in the last 168 hours. Thyroid No results for input(s): "TSH", "FREET4" in the last 168 hours. BNPNo results for input(s): "BNP", "PROBNP" in the last 168 hours.  DDimer No results for input(s): "DDIMER" in the last 168 hours.   Radiology/Studies:  No results found.   Assessment and Plan:   Pacemaker at [facility]. Generator change today. This procedure has been fully reviewed with the patient and written informed consent has been obtained.    Risk Assessment/Risk Scores:     For questions or updates, please contact [facility] Please consult [email or web address] for contact info under     Signed, Thurmon Fair, MD  07/24/2022 12:50 PM

## 2022-07-25 ENCOUNTER — Encounter (HOSPITAL_COMMUNITY): Payer: Self-pay | Admitting: Cardiovascular Disease

## 2022-07-25 MED FILL — Midazolam HCl Inj 2 MG/2ML (Base Equivalent): INTRAMUSCULAR | Qty: 2 | Status: AC

## 2022-08-06 ENCOUNTER — Ambulatory Visit: Payer: Commercial Managed Care - PPO | Attending: Internal Medicine

## 2022-08-06 DIAGNOSIS — I495 Sick sinus syndrome: Secondary | ICD-10-CM | POA: Diagnosis not present

## 2022-08-06 NOTE — Patient Instructions (Signed)

## 2022-08-06 NOTE — Progress Notes (Signed)
Remote pacemaker transmission.   

## 2022-08-06 NOTE — Progress Notes (Signed)
Wound check appointment. Steri-strips removed. Wound without redness or edema. Incision edges approximated, wound well healed. Normal device function. Thresholds, sensing, and impedances consistent with implant measurements. Histogram distribution appropriate for patient and level of activity. No mode switches or high ventricular rates noted. Patient educated about wound care. ROV in 3 months with implanting physician. 13.2 years estimated battery life.

## 2022-08-06 NOTE — Addendum Note (Signed)
Addended by: Geralyn Flash D on: 08/06/2022 02:39 PM   Modules accepted: Level of Service

## 2022-08-13 ENCOUNTER — Ambulatory Visit: Payer: Commercial Managed Care - PPO

## 2022-08-28 ENCOUNTER — Encounter: Payer: Commercial Managed Care - PPO | Admitting: Cardiovascular Disease

## 2022-09-06 ENCOUNTER — Other Ambulatory Visit: Payer: Self-pay | Admitting: Cardiovascular Disease

## 2022-10-09 ENCOUNTER — Encounter: Payer: Commercial Managed Care - PPO | Admitting: Cardiovascular Disease

## 2022-10-13 ENCOUNTER — Ambulatory Visit: Payer: Commercial Managed Care - PPO

## 2022-10-28 ENCOUNTER — Ambulatory Visit: Payer: Commercial Managed Care - PPO

## 2022-10-28 DIAGNOSIS — I495 Sick sinus syndrome: Secondary | ICD-10-CM | POA: Diagnosis not present

## 2022-10-28 LAB — CUP PACEART REMOTE DEVICE CHECK
Battery Remaining Longevity: 155 mo
Battery Voltage: 3.2 V
Brady Statistic AP VP Percent: 0.33 %
Brady Statistic AP VS Percent: 99.19 %
Brady Statistic AS VP Percent: 0 %
Brady Statistic AS VS Percent: 0.48 %
Brady Statistic RA Percent Paced: 99.74 %
Brady Statistic RV Percent Paced: 0.33 %
Date Time Interrogation Session: 20240923223928
Implantable Lead Connection Status: 753985
Implantable Lead Connection Status: 753985
Implantable Lead Implant Date: 20150207
Implantable Lead Implant Date: 20150207
Implantable Lead Location: 753859
Implantable Lead Location: 753860
Implantable Lead Model: 5076
Implantable Lead Model: 5076
Implantable Pulse Generator Implant Date: 20240620
Lead Channel Impedance Value: 304 Ohm
Lead Channel Impedance Value: 342 Ohm
Lead Channel Impedance Value: 399 Ohm
Lead Channel Impedance Value: 475 Ohm
Lead Channel Pacing Threshold Amplitude: 0.875 V
Lead Channel Pacing Threshold Amplitude: 2 V
Lead Channel Pacing Threshold Pulse Width: 0.4 ms
Lead Channel Pacing Threshold Pulse Width: 0.4 ms
Lead Channel Sensing Intrinsic Amplitude: 2.25 mV
Lead Channel Sensing Intrinsic Amplitude: 2.25 mV
Lead Channel Sensing Intrinsic Amplitude: 8.125 mV
Lead Channel Sensing Intrinsic Amplitude: 8.125 mV
Lead Channel Setting Pacing Amplitude: 1.5 V
Lead Channel Setting Pacing Amplitude: 4.25 V
Lead Channel Setting Pacing Pulse Width: 0.4 ms
Lead Channel Setting Sensing Sensitivity: 1.2 mV
Zone Setting Status: 755011

## 2022-11-06 ENCOUNTER — Encounter: Payer: Self-pay | Admitting: Cardiovascular Disease

## 2022-11-06 ENCOUNTER — Ambulatory Visit: Payer: Commercial Managed Care - PPO | Attending: Internal Medicine | Admitting: Cardiovascular Disease

## 2022-11-06 VITALS — BP 122/86 | HR 64 | Ht 62.0 in | Wt 227.2 lb

## 2022-11-06 DIAGNOSIS — D6869 Other thrombophilia: Secondary | ICD-10-CM

## 2022-11-06 DIAGNOSIS — I495 Sick sinus syndrome: Secondary | ICD-10-CM

## 2022-11-06 DIAGNOSIS — I10 Essential (primary) hypertension: Secondary | ICD-10-CM

## 2022-11-06 DIAGNOSIS — I480 Paroxysmal atrial fibrillation: Secondary | ICD-10-CM | POA: Diagnosis not present

## 2022-11-06 DIAGNOSIS — Z950 Presence of cardiac pacemaker: Secondary | ICD-10-CM

## 2022-11-06 NOTE — Progress Notes (Signed)
Cardiology Office Note   Date:  11/06/2022   ID:  Priscilla [name], DOB Apr 25, 1965, MRN [id number]  PCP:  Jarrett Soho, PA-C  Cardiologist:  Thurmon Fair, MD  Electrophysiologist:  None   Evaluation Performed:  Follow-Up Visit  Chief Complaint: Pacemaker follow-up, atrial flutter/fibrillation  History of Present Illness:    Priscilla [name] is a 57 y.o. female with a history of atrial flutter/atrial fibrillation with previous radiofrequency ablation followed by atrial standstill and pacemaker implantation.  She has longstanding problems with morbid obesity, s/p bariatric surgery in early June 2022. She had pacemaker changeout June 2024 (MRI conditional Medtronic Azure w [phone] leads).  The patient specifically denies any chest pain at rest exertion, dyspnea at rest or with exertion, orthopnea, paroxysmal nocturnal dyspnea, syncope, palpitations, focal neurological deficits, intermittent claudication, lower extremity edema, unexplained weight gain, cough, hemoptysis or wheezing.  The site is healed well.  She has no bleeding problems.  The burden of atrial fibrillation remains very low.  She only had a single 15-minute episode that occurred in late June.  She has 99.7% atrial pacing and only 0.3% ventricular pacing.  Estimated generator longevity is 13 years.  The heart rate histogram distribution is appropriate.  She has a chronically high ventricular pacing threshold that has been stable and really does not impact device function.  She lost over 80 pounds of weight with diet and bariatric surgery, but has gained back 17 pounds in the last year.  She had a normal sleep study 2018.  She had iron deficiency anemia related to menorrhagia.  She has treated hypothyroidism.    She has a history of normal coronary arteries by previous angiography. In the past, her chest discomfort seemed to improve when beta blocker dose was reduced and her AV delay was liberalized to reduce the incidence of  ventricular pacing. Echo in June 2016 showed normal left ventricular systolic function. Right heart chambers were slightly dilated, but a sleep study performed since that time has showed no evidence of sleep apnea.Dr. Johney Frame EP evaluation February 2017: "... significant atriopathy.  I anticipate that she will continue to have progressive atrial arrhythmias in the future.  Further ablation would carry low success and likely very little benefit.  If she has robust increase in atrial arrhythmias, could consider AAD therapy in the future...."   Past Medical History:  Diagnosis Date   Atrial fibrillation (HCC) 2015   Atrial flutter (HCC) 2015   status post a flutter ablation and subsequent Medtronic pacemaker, battery 2015   CHF (congestive heart failure) (HCC)    Diabetes mellitus without complication (HCC)    type 2    Dysrhythmia    atfib/at flutter    Heart murmur    History of colonoscopy 01/23/15   History of mammogram 11/13/14   Hypertension    Hypothyroidism    Obese    Presence of permanent cardiac pacemaker    Sick sinus syndrome (HCC) 08/01/2014   Dual chamber Medtronic advisa MRI conditional implanted February 2015   Past Surgical History:  Procedure Laterality Date   ATRIAL FLUTTER ABLATION  2015   in [place]   BLADDER SUSPENSION N/A 01/05/2015   Procedure: TRANSVAGINAL TAPE (TVT) PROCEDURE;  Surgeon: Osborn Coho, MD;  Location: [facility];  Service: Gynecology;  Laterality: N/A;   CARDIAC CATHETERIZATION  03/2013   CARDIOVERSION N/A 12/18/2015   Procedure: CARDIOVERSION;  Surgeon: Thurmon Fair, MD;  Location: [facility];  Service: Cardiovascular;  Laterality: N/A;   CESAREAN SECTION  x3   CYSTOSCOPY N/A 01/05/2015   Procedure: CYSTOSCOPY;  Surgeon: Osborn Coho, MD;  Location: [facility];  Service: Gynecology;  Laterality: N/A;   LAPAROSCOPIC GASTRIC SLEEVE RESECTION N/A 07/09/2020   Procedure: LAPAROSCOPIC GASTRIC SLEEVE RESECTION;  Surgeon: Sheliah Hatch, De Blanch, MD;   Location: [facility];  Service: General;  Laterality: N/A;   PACEMAKER INSERTION  03/2013   Medtronic Advisa DR MRI implanted in [place] for sick sinus syndrome   PPM GENERATOR CHANGEOUT N/A 07/24/2022   Procedure: PPM GENERATOR CHANGEOUT;  Surgeon: Thurmon Fair, MD;  [facility];  Service: Cardiovascular;  Laterality: N/A;   UPPER GI ENDOSCOPY N/A 07/09/2020   Procedure: UPPER GI ENDOSCOPY;  Surgeon: Sheliah Hatch De Blanch, MD;  Location: [facility];  Service: General;  Laterality: N/A;     Current Meds  Medication Sig   apixaban (ELIQUIS) 5 MG TABS tablet Take 1 tablet (5 mg total) by mouth 2 (two) times daily.   BLACK ELDERBERRY PO Take 1 tablet by mouth 2 (two) times daily as needed (In the winter). Sambucol Quick Dissolve Tablets   calcium carbonate (CALCIUM ANTACID) 500 MG chewable tablet Chew 1 tablet by mouth 3 (three) times daily.   levothyroxine (SYNTHROID, LEVOTHROID) 25 MCG tablet Take 25 mcg by mouth daily before breakfast.   Melatonin 5 MG TABS Take 5 mg by mouth at bedtime as needed (for sleep).   metoprolol tartrate (LOPRESSOR) 50 MG tablet TAKE 1/2 TABLET TWICE A DAY   Multiple Vitamins-Iron (MULTIVITAMIN PLUS IRON ADULT) TABS Take 1 tablet by mouth daily.     Allergies:   Patient has no known allergies.   Social History   Tobacco Use   Smoking status: Never   Smokeless tobacco: Never  Vaping Use   Vaping status: Never Used  Substance Use Topics   Alcohol use: Yes    Alcohol/week: 2.0 - 4.0 standard drinks of alcohol    Types: 2 - 4 Glasses of wine per week    Comment: minimal    Drug use: No     Family Hx: The patient's family history includes Breast cancer in her maternal grandmother; Heart attack (age of onset: 62) in her paternal grandfather; Heart attack (age of onset: 65) in her father; Rheumatic fever in her mother; Stroke in her mother.  ROS:   Please see the history of present illness.    All other systems are reviewed and are negative.    Prior CV studies:   The following studies were reviewed today:  Comprehensive pacemaker check in the office today  Labs/Other Tests and Data Reviewed:     Studies Reviewed: Marland Kitchen   EKG Interpretation Date/Time:  Thursday November 06 2022 09:33:04 EDT Ventricular Rate:  64 PR Interval:  298 QRS Duration:  146 QT Interval:  420 QTC Calculation: 433 R Axis:   -43  Text Interpretation: Atrial-paced rhythm with prolonged AV conduction Left axis deviation Left bundle branch block When compared with ECG of 16-Nov-2019 10:44, Left bundle branch block is now Present Confirmed by Alora Gorey [phone]) on 11/06/2022 9:43:29 AM     Risk Assessment/Calculations:    CHA2DS2-VASc Score = 2   This indicates a 2.2% annual risk of stroke. The patient's score is based upon: CHF History: 0 HTN History: 1 Diabetes History: 0 Stroke History: 0 Vascular Disease History: 0 Age Score: 0 Gender Score: 1              Recent Labs: 04/17/2022: BUN 12; Creatinine, Ser 0.80; Hemoglobin  13.9; Platelets 264; Potassium 4.5; Sodium 142  06/10/2019 Hemoglobin 13.4, creatinine 0.88, potassium 4.4  01/15/2021 Creatinine 0.92, potassium 4.9, ALT 21  Recent Lipid Panel No results found for: "CHOL", "TRIG", "HDL", "CHOLHDL", "LDLCALC", "LDLDIRECT" 06/10/2019 Total cholesterol 152, HDL 61, LDL 72, triglycerides 105  01/15/2021 Cholesterol 121, HDL 40, LDL 66, triglycerides 70  91/47/8295 Cholesterol 169, HDL 71, LDL 88, triglycerides 99 Hemoglobin A1c 5.6% Hemoglobin 13 Creatinine 0.83, potassium 4.4, TSH 2.56  Wt Readings from Last 3 Encounters:  11/06/22 227 lb 3.2 oz (103.1 kg)  07/24/22 225 lb (102.1 kg)  04/17/22 219 lb 6.4 oz (99.5 kg)     Objective:    Vital Signs:  BP 122/86 (BP Location: Left Arm, Patient Position: Sitting, Cuff Size: Large)   Pulse 64   Ht 5\' 2"  (1.575 m)   Wt 227 lb 3.2 oz (103.1 kg)   LMP 12/25/2014   SpO2 96%   BMI 41.56 kg/m    General: Alert,  oriented x3, no distress, healthy pacemaker site.  Morbidly obese. Head: no evidence of trauma, PERRL, EOMI, no exophtalmos or lid lag, no myxedema, no xanthelasma; normal ears, nose and oropharynx Neck: normal jugular venous pulsations and no hepatojugular reflux; brisk carotid pulses without delay and no carotid bruits Chest: clear to auscultation, no signs of consolidation by percussion or palpation, normal fremitus, symmetrical and full respiratory excursions Cardiovascular: normal position and quality of the apical impulse, regular rhythm, normal first and second heart sounds, no murmurs, rubs or gallops Abdomen: no tenderness or distention, no masses by palpation, no abnormal pulsatility or arterial bruits, normal bowel sounds, no hepatosplenomegaly Extremities: no clubbing, cyanosis or edema; 2+ radial, ulnar and brachial pulses bilaterally; 2+ right femoral, posterior tibial and dorsalis pedis pulses; 2+ left femoral, posterior tibial and dorsalis pedis pulses; no subclavian or femoral bruits Neurological: grossly nonfocal Psych: Normal mood and affect    ASSESSMENT & PLAN:    1. Paroxysmal atrial fibrillation (HCC)   2. SSS (sick sinus syndrome) (HCC)   3. Pacemaker   4. Acquired thrombophilia (HCC)   5. Morbid obesity (HCC)   6. Essential hypertension       AFib/AFlutter: Extremely low AFib prevalence.  She is appropriately anticoagulated.  CHA2DS2-VASc at least 2 (gender, hypertension), but embolic risk elevated due to significant atriopathy/atrial standstill.   SSS: "Atrially pacemaker dependent", atrial standstill.  Good histogram distribution. PM: Well-healed after generator change. Anticoagulation: No problems of bleeding. Morbid obesity: Slowly regaining some weight after marked initial weight loss. HTN: good control.  Patient Instructions  Medication Instructions:  No changes *If you need a refill on your cardiac medications before your next appointment, please call  your pharmacy*  Follow-Up: At [facility], you and your health needs are our priority.  As part of our continuing mission to provide you with exceptional heart care, we have created designated Provider Care Teams.  These Care Teams include your primary Cardiologist (physician) and Advanced Practice Providers (APPs -  Physician Assistants and Nurse Practitioners) who all work together to provide you with the care you need, when you need it.  We recommend signing up for the patient portal called "MyChart".  Sign up information is provided on this After Visit Summary.  MyChart is used to connect with patients for Virtual Visits (Telemedicine).  Patients are able to view lab/test results, encounter notes, upcoming appointments, etc.  Non-urgent messages can be sent to your provider as well.   To learn more about what you can  do with MyChart, go to [email or web address].    Your next appointment:   1 year(s)  Provider:   Thurmon Fair, MD       Signed, Thurmon Fair, MD

## 2022-11-06 NOTE — Patient Instructions (Signed)

## 2022-11-14 NOTE — Progress Notes (Signed)
Remote pacemaker transmission.   

## 2022-11-19 ENCOUNTER — Encounter: Payer: Self-pay | Admitting: Cardiovascular Disease

## 2023-01-27 ENCOUNTER — Ambulatory Visit (INDEPENDENT_AMBULATORY_CARE_PROVIDER_SITE_OTHER): Payer: Commercial Managed Care - PPO

## 2023-01-27 DIAGNOSIS — I495 Sick sinus syndrome: Secondary | ICD-10-CM | POA: Diagnosis not present

## 2023-01-27 LAB — CUP PACEART REMOTE DEVICE CHECK
Battery Remaining Longevity: 152 mo
Battery Voltage: 3.17 V
Brady Statistic AP VP Percent: 0.25 %
Brady Statistic AP VS Percent: 99.08 %
Brady Statistic AS VP Percent: 0 %
Brady Statistic AS VS Percent: 0.68 %
Brady Statistic RA Percent Paced: 99.65 %
Brady Statistic RV Percent Paced: 0.25 %
Date Time Interrogation Session: 20241223182944
Implantable Lead Connection Status: 753985
Implantable Lead Connection Status: 753985
Implantable Lead Implant Date: 20150207
Implantable Lead Implant Date: 20150207
Implantable Lead Location: 753859
Implantable Lead Location: 753860
Implantable Lead Model: 5076
Implantable Lead Model: 5076
Implantable Pulse Generator Implant Date: 20240620
Lead Channel Impedance Value: 323 Ohm
Lead Channel Impedance Value: 342 Ohm
Lead Channel Impedance Value: 380 Ohm
Lead Channel Impedance Value: 456 Ohm
Lead Channel Pacing Threshold Amplitude: 0.875 V
Lead Channel Pacing Threshold Amplitude: 2 V
Lead Channel Pacing Threshold Pulse Width: 0.4 ms
Lead Channel Pacing Threshold Pulse Width: 0.4 ms
Lead Channel Sensing Intrinsic Amplitude: 3.625 mV
Lead Channel Sensing Intrinsic Amplitude: 3.625 mV
Lead Channel Sensing Intrinsic Amplitude: 6.875 mV
Lead Channel Sensing Intrinsic Amplitude: 6.875 mV
Lead Channel Setting Pacing Amplitude: 1.5 V
Lead Channel Setting Pacing Amplitude: 4 V
Lead Channel Setting Pacing Pulse Width: 0.4 ms
Lead Channel Setting Sensing Sensitivity: 1.2 mV
Zone Setting Status: 755011

## 2023-04-16 ENCOUNTER — Encounter: Payer: Self-pay | Admitting: Cardiovascular Disease

## 2023-04-16 NOTE — Telephone Encounter (Signed)
 No need to take the monitor on the cruise.  Please prescribe a scopolamine patch 1mg  to the skin behind the ear , apply every 72 hours. #3 patches, no refills

## 2023-04-20 ENCOUNTER — Other Ambulatory Visit (HOSPITAL_COMMUNITY): Payer: Self-pay

## 2023-04-20 MED ORDER — SCOPOLAMINE 1 MG/3DAYS TD PT72: 1.0000 | MEDICATED_PATCH | TRANSDERMAL | 0 refills | Status: AC

## 2023-04-20 MED ORDER — SCOPOLAMINE 1 MG/3DAYS TD PT72
1.0000 | MEDICATED_PATCH | TRANSDERMAL | 0 refills | Status: DC
  Filled 2023-04-20: qty 3, 9d supply, fill #0

## 2023-04-28 ENCOUNTER — Ambulatory Visit (INDEPENDENT_AMBULATORY_CARE_PROVIDER_SITE_OTHER): Payer: Commercial Managed Care - PPO

## 2023-04-28 DIAGNOSIS — I495 Sick sinus syndrome: Secondary | ICD-10-CM | POA: Diagnosis not present

## 2023-04-29 LAB — CUP PACEART REMOTE DEVICE CHECK
Battery Remaining Longevity: 150 mo
Battery Voltage: 3.12 V
Brady Statistic AP VP Percent: 0.19 %
Brady Statistic AP VS Percent: 99.45 %
Brady Statistic AS VP Percent: 0 %
Brady Statistic AS VS Percent: 0.36 %
Brady Statistic RA Percent Paced: 99.76 %
Brady Statistic RV Percent Paced: 0.19 %
Date Time Interrogation Session: 20250325031306
Implantable Lead Connection Status: 753985
Implantable Lead Connection Status: 753985
Implantable Lead Implant Date: 20150207
Implantable Lead Implant Date: 20150207
Implantable Lead Location: 753859
Implantable Lead Location: 753860
Implantable Lead Model: 5076
Implantable Lead Model: 5076
Implantable Pulse Generator Implant Date: 20240620
Lead Channel Impedance Value: 323 Ohm
Lead Channel Impedance Value: 361 Ohm
Lead Channel Impedance Value: 399 Ohm
Lead Channel Impedance Value: 494 Ohm
Lead Channel Pacing Threshold Amplitude: 0.875 V
Lead Channel Pacing Threshold Amplitude: 2.125 V
Lead Channel Pacing Threshold Pulse Width: 0.4 ms
Lead Channel Pacing Threshold Pulse Width: 0.4 ms
Lead Channel Sensing Intrinsic Amplitude: 2.75 mV
Lead Channel Sensing Intrinsic Amplitude: 2.75 mV
Lead Channel Sensing Intrinsic Amplitude: 9.125 mV
Lead Channel Sensing Intrinsic Amplitude: 9.125 mV
Lead Channel Setting Pacing Amplitude: 1.5 V
Lead Channel Setting Pacing Amplitude: 4.25 V
Lead Channel Setting Pacing Pulse Width: 0.4 ms
Lead Channel Setting Sensing Sensitivity: 1.2 mV
Zone Setting Status: 755011

## 2023-05-10 ENCOUNTER — Encounter: Payer: Self-pay | Admitting: Cardiovascular Disease

## 2023-06-12 ENCOUNTER — Other Ambulatory Visit: Payer: Self-pay | Admitting: Cardiovascular Disease

## 2023-06-12 DIAGNOSIS — I4819 Other persistent atrial fibrillation: Secondary | ICD-10-CM

## 2023-06-12 NOTE — Addendum Note (Signed)
 Addended by: Lott Rouleau A on: 06/12/2023 03:41 PM   Modules accepted: Orders

## 2023-06-12 NOTE — Progress Notes (Signed)
 Remote pacemaker transmission.

## 2023-06-12 NOTE — Telephone Encounter (Signed)
 Eliquis  5mg  refill request received. Patient is 58 years old, weight-103.1kg, Crea-0.83 on 07/09/22 via [facility] [place], Diagnosis-Afib, and last seen by Dr. Alvis Ba on 11/06/22. Dose is appropriate based on dosing criteria.

## 2023-07-28 ENCOUNTER — Ambulatory Visit (INDEPENDENT_AMBULATORY_CARE_PROVIDER_SITE_OTHER): Payer: Commercial Managed Care - PPO

## 2023-07-28 DIAGNOSIS — I495 Sick sinus syndrome: Secondary | ICD-10-CM | POA: Diagnosis not present

## 2023-07-28 LAB — CUP PACEART REMOTE DEVICE CHECK
Battery Remaining Longevity: 147 mo
Battery Voltage: 3.07 V
Brady Statistic AP VP Percent: 0.25 %
Brady Statistic AP VS Percent: 99.28 %
Brady Statistic AS VP Percent: 0 %
Brady Statistic AS VS Percent: 0.46 %
Brady Statistic RA Percent Paced: 99.75 %
Brady Statistic RV Percent Paced: 0.25 %
Date Time Interrogation Session: 20250624042907
Implantable Lead Connection Status: 753985
Implantable Lead Connection Status: 753985
Implantable Lead Implant Date: 20150207
Implantable Lead Implant Date: 20150207
Implantable Lead Location: 753859
Implantable Lead Location: 753860
Implantable Lead Model: 5076
Implantable Lead Model: 5076
Implantable Pulse Generator Implant Date: 20240620
Lead Channel Impedance Value: 342 Ohm
Lead Channel Impedance Value: 361 Ohm
Lead Channel Impedance Value: 399 Ohm
Lead Channel Impedance Value: 494 Ohm
Lead Channel Pacing Threshold Amplitude: 1 V
Lead Channel Pacing Threshold Amplitude: 2.25 V
Lead Channel Pacing Threshold Pulse Width: 0.4 ms
Lead Channel Pacing Threshold Pulse Width: 0.4 ms
Lead Channel Sensing Intrinsic Amplitude: 2.625 mV
Lead Channel Sensing Intrinsic Amplitude: 2.625 mV
Lead Channel Sensing Intrinsic Amplitude: 8.5 mV
Lead Channel Sensing Intrinsic Amplitude: 8.5 mV
Lead Channel Setting Pacing Amplitude: 1.5 V
Lead Channel Setting Pacing Amplitude: 4.5 V
Lead Channel Setting Pacing Pulse Width: 0.4 ms
Lead Channel Setting Sensing Sensitivity: 1.2 mV
Zone Setting Status: 755011

## 2023-08-03 ENCOUNTER — Ambulatory Visit: Payer: Self-pay | Admitting: Cardiovascular Disease

## 2023-08-25 ENCOUNTER — Other Ambulatory Visit: Payer: Self-pay | Admitting: Cardiovascular Disease

## 2023-10-27 ENCOUNTER — Ambulatory Visit: Payer: Commercial Managed Care - PPO

## 2023-10-27 DIAGNOSIS — I495 Sick sinus syndrome: Secondary | ICD-10-CM | POA: Diagnosis not present

## 2023-10-28 LAB — CUP PACEART REMOTE DEVICE CHECK
Battery Remaining Longevity: 144 mo
Battery Voltage: 3.05 V
Brady Statistic AP VP Percent: 0.47 %
Brady Statistic AP VS Percent: 99.06 %
Brady Statistic AS VP Percent: 0 %
Brady Statistic AS VS Percent: 0.47 %
Brady Statistic RA Percent Paced: 99.76 %
Brady Statistic RV Percent Paced: 0.47 %
Date Time Interrogation Session: 20250922201128
Implantable Lead Connection Status: 753985
Implantable Lead Connection Status: 753985
Implantable Lead Implant Date: 20150207
Implantable Lead Implant Date: 20150207
Implantable Lead Location: 753859
Implantable Lead Location: 753860
Implantable Lead Model: 5076
Implantable Lead Model: 5076
Implantable Pulse Generator Implant Date: 20240620
Lead Channel Impedance Value: 342 Ohm
Lead Channel Impedance Value: 361 Ohm
Lead Channel Impedance Value: 399 Ohm
Lead Channel Impedance Value: 475 Ohm
Lead Channel Pacing Threshold Amplitude: 0.875 V
Lead Channel Pacing Threshold Amplitude: 2.125 V
Lead Channel Pacing Threshold Pulse Width: 0.4 ms
Lead Channel Pacing Threshold Pulse Width: 0.4 ms
Lead Channel Sensing Intrinsic Amplitude: 3 mV
Lead Channel Sensing Intrinsic Amplitude: 3 mV
Lead Channel Sensing Intrinsic Amplitude: 7.375 mV
Lead Channel Sensing Intrinsic Amplitude: 7.375 mV
Lead Channel Setting Pacing Amplitude: 1.5 V
Lead Channel Setting Pacing Amplitude: 4.25 V
Lead Channel Setting Pacing Pulse Width: 0.4 ms
Lead Channel Setting Sensing Sensitivity: 1.2 mV
Zone Setting Status: 755011

## 2023-10-28 NOTE — Progress Notes (Signed)
 Remote PPM Transmission

## 2023-10-30 NOTE — Progress Notes (Signed)
 Remote PPM Transmission

## 2023-10-31 ENCOUNTER — Ambulatory Visit: Payer: Self-pay | Admitting: Cardiovascular Disease

## 2023-11-24 ENCOUNTER — Other Ambulatory Visit: Payer: Self-pay | Admitting: Cardiovascular Disease

## 2023-11-24 ENCOUNTER — Encounter: Payer: Self-pay | Admitting: Cardiovascular Disease

## 2023-11-24 DIAGNOSIS — I4819 Other persistent atrial fibrillation: Secondary | ICD-10-CM

## 2023-11-24 NOTE — Telephone Encounter (Signed)
 Prescription refill request for Eliquis  received. Indication: PAF Last office visit: 11/06/22  CHRISTELLA Croitoru MD Scr: 0.83 on 07/09/22  Epic Age: 58 Weight: 103.1kg  Based on above findings Eliquis  5mg  twice daily is the appropriate dose.  Pt is past due for appt with Dr Francyne and labs.  Message sent to schedulers.  Refill approved.

## 2024-01-07 ENCOUNTER — Encounter: Payer: Self-pay | Admitting: Cardiovascular Disease

## 2024-01-07 ENCOUNTER — Ambulatory Visit: Attending: Cardiovascular Disease | Admitting: Cardiovascular Disease

## 2024-01-07 VITALS — BP 122/78 | HR 65 | Ht 62.0 in | Wt 219.2 lb

## 2024-01-07 DIAGNOSIS — I10 Essential (primary) hypertension: Secondary | ICD-10-CM

## 2024-01-07 DIAGNOSIS — I4819 Other persistent atrial fibrillation: Secondary | ICD-10-CM | POA: Diagnosis not present

## 2024-01-07 DIAGNOSIS — Z950 Presence of cardiac pacemaker: Secondary | ICD-10-CM | POA: Diagnosis not present

## 2024-01-07 DIAGNOSIS — I495 Sick sinus syndrome: Secondary | ICD-10-CM | POA: Diagnosis not present

## 2024-01-07 DIAGNOSIS — I480 Paroxysmal atrial fibrillation: Secondary | ICD-10-CM

## 2024-01-07 NOTE — Patient Instructions (Signed)

## 2024-01-07 NOTE — Progress Notes (Unsigned)
 Cardiology Office Note   Date:  01/10/2024   ID:  Priscilla [name], DOB 01/27/1966, MRN [id number]  PCP:  Katina Pfeiffer, PA-C  Cardiologist:  Jerel Balding, MD  Electrophysiologist:  None   Evaluation Performed:  Follow-Up Visit  Chief Complaint: Pacemaker follow-up, atrial flutter/fibrillation  History of Present Illness:    Priscilla [name] is a 58 y.o. female with a history of atrial flutter/atrial fibrillation with previous radiofrequency ablation followed by atrial standstill and pacemaker implantation.  She has longstanding problems with morbid obesity, s/p bariatric surgery in early June 2022. She had pacemaker changeout June 2024 (MRI conditional Medtronic Azure w [phone] leads).  The patient specifically denies any chest pain at rest or with exertion, dyspnea at rest or with exertion, orthopnea, paroxysmal nocturnal dyspnea, syncope, palpitations, focal neurological deficits, intermittent claudication, lower extremity edema, unexplained weight gain, cough, hemoptysis or wheezing.  Pacemaker interrogation shows normal device function with estimated generator longevity of 11.7 years and excellent lead parameters with exception of the elevated RV threshold which is a chronic problem, although thankfully she only required 0.3% ventricular pacing.  She has 99.7% atrial pacing.  There is been 1 atrial fibrillation episode that occurred in February 2025 and lasted for about 1.5 hours.  She has not had any episode of high ventricular rates.  Activity is constant roughly 4 hours a day.  Heart rate histogram distribution appears appropriate.  She also commencement weight after bariatric surgery roughly 80 pounds, gained some of it back afterwards.  Current weight is right at the border of morbid obesity with a BMI of 40.  She had a normal sleep study 2018.  She had iron deficiency anemia related to menorrhagia.  She has treated hypothyroidism.    She has a history of normal coronary arteries  by previous angiography. In the past, her chest discomfort seemed to improve when beta blocker dose was reduced and her AV delay was liberalized to reduce the incidence of ventricular pacing. Echo in June 2016 showed normal left ventricular systolic function. Right heart chambers were slightly dilated, but a sleep study performed since that time has showed no evidence of sleep apnea.Dr. Kelsie EP evaluation February 2017: ... significant atriopathy.  I anticipate that she will continue to have progressive atrial arrhythmias in the future.  Further ablation would carry low success and likely very little benefit.  If she has robust increase in atrial arrhythmias, could consider AAD therapy in the future....   Past Medical History:  Diagnosis Date   Atrial fibrillation (HCC) 2015   Atrial flutter (HCC) 2015   status post a flutter ablation and subsequent Medtronic pacemaker, battery 2015   CHF (congestive heart failure) (HCC)    Diabetes mellitus without complication (HCC)    type 2    Dysrhythmia    atfib/at flutter    Heart murmur    History of colonoscopy 01/23/15   History of mammogram 11/13/14   Hypertension    Hypothyroidism    Obese    Presence of permanent cardiac pacemaker    Sick sinus syndrome (HCC) 08/01/2014   Dual chamber Medtronic advisa MRI conditional implanted February 2015   Past Surgical History:  Procedure Laterality Date   ATRIAL FLUTTER ABLATION  2015   in [place]    BLADDER SUSPENSION N/A 01/05/2015   Procedure: TRANSVAGINAL TAPE (TVT) PROCEDURE;  Surgeon: Jon Rummer, MD;  Location: [facility];  Service: Gynecology;  Laterality: N/A;   CARDIAC CATHETERIZATION  03/2013   CARDIOVERSION N/A 12/18/2015  Procedure: CARDIOVERSION;  Surgeon: Jerel Balding, MD;  Location: [facility];  Service: Cardiovascular;  Laterality: N/A;   CESAREAN SECTION     x3   CYSTOSCOPY N/A 01/05/2015   Procedure: CYSTOSCOPY;  Surgeon: Jon Rummer, MD;  Location: [facility];  Service:  Gynecology;  Laterality: N/A;   LAPAROSCOPIC GASTRIC SLEEVE RESECTION N/A 07/09/2020   Procedure: LAPAROSCOPIC GASTRIC SLEEVE RESECTION;  Surgeon: Stevie, Herlene Righter, MD;  Location: [facility];  Service: General;  Laterality: N/A;   PACEMAKER INSERTION  03/2013   Medtronic Advisa DR MRI implanted in [place] for sick sinus syndrome   PPM GENERATOR CHANGEOUT N/A 07/24/2022   Procedure: PPM GENERATOR CHANGEOUT;  Surgeon: Balding Jerel, MD;  [facility];  Service: Cardiovascular;  Laterality: N/A;   UPPER GI ENDOSCOPY N/A 07/09/2020   Procedure: UPPER GI ENDOSCOPY;  Surgeon: Stevie Herlene Righter, MD;  Location: [facility];  Service: General;  Laterality: N/A;     Current Meds  Medication Sig   BLACK ELDERBERRY PO Take 1 tablet by mouth 2 (two) times daily as needed (In the winter). Sambucol Quick Dissolve Tablets (Patient taking differently: Take 1 tablet by mouth daily as needed (In the winter). Sambucol Quick Dissolve Tablets)   ELIQUIS  5 MG TABS tablet Take 1 tablet by mouth twice daily   levothyroxine  (SYNTHROID , LEVOTHROID) 25 MCG tablet Take 25 mcg by mouth daily before breakfast.   Melatonin 5 MG TABS Take 5 mg by mouth at bedtime as needed (for sleep).   metoprolol  tartrate (LOPRESSOR ) 50 MG tablet TAKE 1/2 TABLET TWICE A DAY   Multiple Vitamins-Iron (MULTIVITAMIN PLUS IRON ADULT) TABS Take 1 tablet by mouth daily.   scopolamine  (TRANSDERM-SCOP) 1 MG/3DAYS Place 1 patch (1.5 mg total) onto the skin every 3 (three) days. 1mg  to the skin behind the ear , apply every 72 hours   WEGOVY 0.25 MG/0.5ML SOAJ SQ injection Inject 0.25 mg into the skin once a week.     Allergies:   Patient has no known allergies.   Social History   Tobacco Use   Smoking status: Never   Smokeless tobacco: Never  Vaping Use   Vaping status: Never Used  Substance Use Topics   Alcohol use: Yes    Alcohol/week: 2.0 - 4.0 standard drinks of alcohol    Types: 2 - 4 Glasses of wine per week    Comment:  minimal    Drug use: No     Family Hx: The patient's family history includes Breast cancer in her maternal grandmother; Heart attack (age of onset: 18) in her paternal grandfather; Heart attack (age of onset: 70) in her father; Rheumatic fever in her mother; Stroke in her mother.  ROS:   Please see the history of present illness.    All other systems are reviewed and are negative.   Prior CV studies:   The following studies were reviewed today:  Comprehensive pacemaker check in the office today  Labs/Other Tests and Data Reviewed:     Studies Reviewed: SABRA     EKG Interpretation Date/Time:  Thursday January 07 2024 10:48:46 EST Ventricular Rate:  65 PR Interval:  278 QRS Duration:  144 QT Interval:  410 QTC Calculation: 426 R Axis:   -44  Text Interpretation: Atrial-paced rhythm with prolonged AV conduction Left axis deviation Left bundle branch block When compared with ECG of 06-Nov-2022 09:33, No significant change was found Confirmed by Saria Haran ([id number]) on 01/07/2024 11:13:37 AM  Risk Assessment/Calculations:    CHA2DS2-VASc Score = 2  The patient's score is based upon: CHF History: 0 HTN History: 1 Diabetes History: 0 Stroke History: 0 Vascular Disease History: 0 Age Score: 0 Gender Score: 1  ASSESSMENT AND PLAN: Paroxysmal Atrial Fibrillation (ICD10:  I48.0) The patient's CHA2DS2-VASc score is 2, indicating a 2.2% annual risk of stroke.    Although the CHA2DS2-VASc score is low, she is at high risk for clots due to severe atrial apathy.  Recent Labs: No results found for requested labs within last 365 days.  06/10/2019 Hemoglobin 13.4, creatinine 0.88, potassium 4.4  01/15/2021 Creatinine 0.92, potassium 4.9, ALT 21  Recent Lipid Panel No results found for: CHOL, TRIG, HDL, CHOLHDL, LDLCALC, LDLDIRECT 06/10/2019 Total cholesterol 152, HDL 61, LDL 72, triglycerides 105  01/15/2021 Cholesterol 121, HDL 40, LDL 66,  triglycerides 70  07/09/2022 Cholesterol 169, HDL 71, LDL 88, triglycerides 99 Hemoglobin A1c 5.6% Hemoglobin 13 Creatinine 0.83, potassium 4.4, TSH 2.56  07/15/2023 Cholesterol 170, HDL 69, LDL 84, triglycerides 99 Hemoglobin A1c 5.6%, potassium 4.3, ALT 14, TSH 3.010  Wt Readings from Last 3 Encounters:  01/07/24 219 lb 3.2 oz (99.4 kg)  11/06/22 227 lb 3.2 oz (103.1 kg)  07/24/22 225 lb (102.1 kg)     Objective:    Vital Signs:  BP 122/78 (BP Location: Left Arm, Patient Position: Sitting, Cuff Size: Large)   Pulse 65   Ht 5' 2 (1.575 m)   Wt 219 lb 3.2 oz (99.4 kg)   LMP 12/25/2014   SpO2 98%   BMI 40.09 kg/m    General: Alert, oriented x3, no distress, borderline morbid obesity.  Healthy left subclavian pacemaker site. Head: no evidence of trauma, PERRL, EOMI, no exophtalmos or lid lag, no myxedema, no xanthelasma; normal ears, nose and oropharynx Neck: normal jugular venous pulsations and no hepatojugular reflux; brisk carotid pulses without delay and no carotid bruits Chest: clear to auscultation, no signs of consolidation by percussion or palpation, normal fremitus, symmetrical and full respiratory excursions Cardiovascular: normal position and quality of the apical impulse, regular rhythm, normal first and second heart sounds, no murmurs, rubs or gallops Abdomen: no tenderness or distention, no masses by palpation, no abnormal pulsatility or arterial bruits, normal bowel sounds, no hepatosplenomegaly Extremities: no clubbing, cyanosis or edema; 2+ radial, ulnar and brachial pulses bilaterally; 2+ right femoral, posterior tibial and dorsalis pedis pulses; 2+ left femoral, posterior tibial and dorsalis pedis pulses; no subclavian or femoral bruits Neurological: grossly nonfocal Psych: Normal mood and affect     ASSESSMENT & PLAN:    1. Paroxysmal atrial fibrillation (HCC)   2. SSS (sick sinus syndrome) (HCC)   3. Pacemaker   4. Essential hypertension   5. Morbid  obesity (HCC)       AFib/AFlutter: Low prevalence of atrial fibrillation, on appropriate anticoagulation..  CHA2DS2-VASc at least 2 (gender, hypertension), but embolic risk elevated due to significant atriopathy/atrial standstill.   SSS: Atrially pacemaker dependent, atrial sensed.  Appropriate heart rate histogram ministration. PM: Healthy pacemaker site, continue all of everything. Anticoagulation: No bleeding complications. Morbid obesity: Slowly regaining some weight after marked initial weight loss. HTN: Good control.  Patient Instructions  Medication Instructions:  No changes *If you need a refill on your cardiac medications before your next appointment, please call your pharmacy*  Lab Work: None ordered If you have labs (blood work) drawn today and your tests are completely normal, you will receive your results only by: MyChart Message (if you have  MyChart) OR A paper copy in the mail If you have any lab test that is abnormal or we need to change your treatment, we will call you to review the results.  Testing/Procedures: None ordered  Follow-Up: At [facility], you and your health needs are our priority.  As part of our continuing mission to provide you with exceptional heart care, our providers are all part of one team.  This team includes your primary Cardiologist (physician) and Advanced Practice Providers or APPs (Physician Assistants and Nurse Practitioners) who all work together to provide you with the care you need, when you need it.  Your next appointment:   1 year(s)  Provider:   Jerel Balding, MD    We recommend signing up for the patient portal called MyChart.  Sign up information is provided on this After Visit Summary.  MyChart is used to connect with patients for Virtual Visits (Telemedicine).  Patients are able to view lab/test results, encounter notes, upcoming appointments, etc.  Non-urgent messages can be sent to your provider as well.    To learn more about what you can do with MyChart, go to [email or web address].      Signed, Jerel Balding, MD

## 2024-01-10 ENCOUNTER — Encounter: Payer: Self-pay | Admitting: Cardiovascular Disease

## 2024-01-19 ENCOUNTER — Encounter: Payer: Self-pay | Admitting: Cardiovascular Disease

## 2024-01-26 ENCOUNTER — Ambulatory Visit: Payer: Commercial Managed Care - PPO

## 2024-01-26 DIAGNOSIS — I480 Paroxysmal atrial fibrillation: Secondary | ICD-10-CM

## 2024-01-29 LAB — CUP PACEART REMOTE DEVICE CHECK
Battery Remaining Longevity: 141 mo
Battery Voltage: 3.03 V
Brady Statistic AP VP Percent: 0.38 %
Brady Statistic AP VS Percent: 99.31 %
Brady Statistic AS VP Percent: 0 %
Brady Statistic AS VS Percent: 0.3 %
Brady Statistic RA Percent Paced: 99.81 %
Brady Statistic RV Percent Paced: 0.38 %
Date Time Interrogation Session: 20251222222104
Implantable Lead Connection Status: 753985
Implantable Lead Connection Status: 753985
Implantable Lead Implant Date: 20150207
Implantable Lead Implant Date: 20150207
Implantable Lead Location: 753859
Implantable Lead Location: 753860
Implantable Lead Model: 5076
Implantable Lead Model: 5076
Implantable Pulse Generator Implant Date: 20240620
Lead Channel Impedance Value: 342 Ohm
Lead Channel Impedance Value: 361 Ohm
Lead Channel Impedance Value: 399 Ohm
Lead Channel Impedance Value: 494 Ohm
Lead Channel Pacing Threshold Amplitude: 0.875 V
Lead Channel Pacing Threshold Amplitude: 2.25 V
Lead Channel Pacing Threshold Pulse Width: 0.4 ms
Lead Channel Pacing Threshold Pulse Width: 0.4 ms
Lead Channel Sensing Intrinsic Amplitude: 2.5 mV
Lead Channel Sensing Intrinsic Amplitude: 2.5 mV
Lead Channel Sensing Intrinsic Amplitude: 8.125 mV
Lead Channel Sensing Intrinsic Amplitude: 8.125 mV
Lead Channel Setting Pacing Amplitude: 1.5 V
Lead Channel Setting Pacing Amplitude: 4.5 V
Lead Channel Setting Pacing Pulse Width: 0.4 ms
Lead Channel Setting Sensing Sensitivity: 1.2 mV
Zone Setting Status: 755011

## 2024-01-29 NOTE — Progress Notes (Signed)
 Remote PPM Transmission

## 2024-01-30 ENCOUNTER — Ambulatory Visit: Payer: Self-pay | Admitting: Cardiovascular Disease

## 2024-02-14 ENCOUNTER — Other Ambulatory Visit: Payer: Self-pay | Admitting: Cardiovascular Disease

## 2024-02-14 DIAGNOSIS — I4819 Other persistent atrial fibrillation: Secondary | ICD-10-CM
